# Patient Record
Sex: Male | Born: 1945 | Race: White | Hispanic: No | Marital: Married | Smoking: Former smoker
Health system: Southern US, Community
[De-identification: ages and names within clinical notes are randomized; demographics above are authoritative.]

## PROBLEM LIST (undated history)

## (undated) DIAGNOSIS — J309 Allergic rhinitis, unspecified: Secondary | ICD-10-CM

## (undated) DIAGNOSIS — R011 Cardiac murmur, unspecified: Secondary | ICD-10-CM

## (undated) DIAGNOSIS — J329 Chronic sinusitis, unspecified: Secondary | ICD-10-CM

## (undated) DIAGNOSIS — R822 Biliuria: Secondary | ICD-10-CM

## (undated) DIAGNOSIS — B079 Viral wart, unspecified: Secondary | ICD-10-CM

## (undated) DIAGNOSIS — K219 Gastro-esophageal reflux disease without esophagitis: Secondary | ICD-10-CM

## (undated) DIAGNOSIS — F419 Anxiety disorder, unspecified: Secondary | ICD-10-CM

## (undated) DIAGNOSIS — H6980 Other specified disorders of Eustachian tube, unspecified ear: Secondary | ICD-10-CM

## (undated) DIAGNOSIS — M51379 Other intervertebral disc degeneration, lumbosacral region without mention of lumbar back pain or lower extremity pain: Secondary | ICD-10-CM

## (undated) DIAGNOSIS — M1990 Unspecified osteoarthritis, unspecified site: Secondary | ICD-10-CM

## (undated) DIAGNOSIS — I10 Essential (primary) hypertension: Secondary | ICD-10-CM

## (undated) DIAGNOSIS — G4730 Sleep apnea, unspecified: Secondary | ICD-10-CM

## (undated) DIAGNOSIS — M5137 Other intervertebral disc degeneration, lumbosacral region: Secondary | ICD-10-CM

## (undated) DIAGNOSIS — M797 Fibromyalgia: Secondary | ICD-10-CM

## (undated) DIAGNOSIS — H6990 Unspecified Eustachian tube disorder, unspecified ear: Secondary | ICD-10-CM

## (undated) DIAGNOSIS — L570 Actinic keratosis: Secondary | ICD-10-CM

## (undated) DIAGNOSIS — N529 Male erectile dysfunction, unspecified: Secondary | ICD-10-CM

## (undated) DIAGNOSIS — N400 Enlarged prostate without lower urinary tract symptoms: Secondary | ICD-10-CM

## (undated) HISTORY — DX: Actinic keratosis: L57.0

## (undated) HISTORY — DX: Benign prostatic hyperplasia without lower urinary tract symptoms: N40.0

## (undated) HISTORY — DX: Unspecified eustachian tube disorder, unspecified ear: H69.90

## (undated) HISTORY — PX: JOINT REPLACEMENT: SHX530

## (undated) HISTORY — DX: Other intervertebral disc degeneration, lumbosacral region: M51.37

## (undated) HISTORY — DX: Other specified disorders of Eustachian tube, unspecified ear: H69.80

## (undated) HISTORY — PX: EYE SURGERY: SHX253

## (undated) HISTORY — DX: Other intervertebral disc degeneration, lumbosacral region without mention of lumbar back pain or lower extremity pain: M51.379

## (undated) HISTORY — PX: CATARACT EXTRACTION: SUR2

## (undated) HISTORY — DX: Unspecified osteoarthritis, unspecified site: M19.90

## (undated) HISTORY — DX: Biliuria: R82.2

## (undated) HISTORY — DX: Chronic sinusitis, unspecified: J32.9

## (undated) HISTORY — DX: Viral wart, unspecified: B07.9

## (undated) HISTORY — DX: Essential (primary) hypertension: I10

## (undated) HISTORY — DX: Allergic rhinitis, unspecified: J30.9

## (undated) HISTORY — PX: TONSILLECTOMY: SUR1361

## (undated) HISTORY — DX: Male erectile dysfunction, unspecified: N52.9

## (undated) HISTORY — PX: HIP SURGERY: SHX245

## (undated) HISTORY — PX: GALLBLADDER SURGERY: SHX652

## (undated) SURGERY — Surgical Case
Anesthesia: *Unknown

---

## 2003-10-03 HISTORY — PX: CHOLECYSTECTOMY: SHX55

## 2012-05-20 DIAGNOSIS — N400 Enlarged prostate without lower urinary tract symptoms: Secondary | ICD-10-CM | POA: Diagnosis not present

## 2012-05-20 DIAGNOSIS — L821 Other seborrheic keratosis: Secondary | ICD-10-CM | POA: Diagnosis not present

## 2012-05-20 DIAGNOSIS — I10 Essential (primary) hypertension: Secondary | ICD-10-CM | POA: Diagnosis not present

## 2012-05-28 DIAGNOSIS — L408 Other psoriasis: Secondary | ICD-10-CM | POA: Diagnosis not present

## 2012-05-28 DIAGNOSIS — D2370 Other benign neoplasm of skin of unspecified lower limb, including hip: Secondary | ICD-10-CM | POA: Diagnosis not present

## 2012-05-28 DIAGNOSIS — L738 Other specified follicular disorders: Secondary | ICD-10-CM | POA: Diagnosis not present

## 2012-05-28 DIAGNOSIS — L570 Actinic keratosis: Secondary | ICD-10-CM | POA: Diagnosis not present

## 2012-05-28 DIAGNOSIS — D485 Neoplasm of uncertain behavior of skin: Secondary | ICD-10-CM | POA: Diagnosis not present

## 2012-06-18 DIAGNOSIS — I10 Essential (primary) hypertension: Secondary | ICD-10-CM | POA: Diagnosis not present

## 2012-06-18 DIAGNOSIS — N400 Enlarged prostate without lower urinary tract symptoms: Secondary | ICD-10-CM | POA: Diagnosis not present

## 2012-06-18 DIAGNOSIS — F329 Major depressive disorder, single episode, unspecified: Secondary | ICD-10-CM | POA: Diagnosis not present

## 2012-06-18 DIAGNOSIS — M1990 Unspecified osteoarthritis, unspecified site: Secondary | ICD-10-CM | POA: Diagnosis not present

## 2012-07-16 DIAGNOSIS — Z23 Encounter for immunization: Secondary | ICD-10-CM | POA: Diagnosis not present

## 2012-07-16 DIAGNOSIS — I10 Essential (primary) hypertension: Secondary | ICD-10-CM | POA: Diagnosis not present

## 2012-07-22 DIAGNOSIS — R350 Frequency of micturition: Secondary | ICD-10-CM | POA: Diagnosis not present

## 2012-07-22 DIAGNOSIS — R3911 Hesitancy of micturition: Secondary | ICD-10-CM | POA: Diagnosis not present

## 2012-07-22 DIAGNOSIS — R3915 Urgency of urination: Secondary | ICD-10-CM | POA: Insufficient documentation

## 2012-07-22 DIAGNOSIS — R351 Nocturia: Secondary | ICD-10-CM | POA: Diagnosis not present

## 2012-07-24 DIAGNOSIS — B079 Viral wart, unspecified: Secondary | ICD-10-CM | POA: Diagnosis not present

## 2012-07-24 DIAGNOSIS — D239 Other benign neoplasm of skin, unspecified: Secondary | ICD-10-CM | POA: Diagnosis not present

## 2012-07-24 DIAGNOSIS — L821 Other seborrheic keratosis: Secondary | ICD-10-CM | POA: Diagnosis not present

## 2012-08-12 DIAGNOSIS — N401 Enlarged prostate with lower urinary tract symptoms: Secondary | ICD-10-CM | POA: Diagnosis not present

## 2012-08-12 DIAGNOSIS — R351 Nocturia: Secondary | ICD-10-CM | POA: Diagnosis not present

## 2012-08-12 DIAGNOSIS — R3911 Hesitancy of micturition: Secondary | ICD-10-CM | POA: Diagnosis not present

## 2012-08-19 DIAGNOSIS — L408 Other psoriasis: Secondary | ICD-10-CM | POA: Diagnosis not present

## 2012-08-19 DIAGNOSIS — L570 Actinic keratosis: Secondary | ICD-10-CM | POA: Diagnosis not present

## 2012-08-19 DIAGNOSIS — L738 Other specified follicular disorders: Secondary | ICD-10-CM | POA: Diagnosis not present

## 2012-08-28 DIAGNOSIS — Z79899 Other long term (current) drug therapy: Secondary | ICD-10-CM | POA: Diagnosis not present

## 2012-09-05 DIAGNOSIS — L923 Foreign body granuloma of the skin and subcutaneous tissue: Secondary | ICD-10-CM | POA: Diagnosis not present

## 2012-09-05 DIAGNOSIS — L408 Other psoriasis: Secondary | ICD-10-CM | POA: Diagnosis not present

## 2012-09-05 DIAGNOSIS — L538 Other specified erythematous conditions: Secondary | ICD-10-CM | POA: Diagnosis not present

## 2012-09-16 DIAGNOSIS — N401 Enlarged prostate with lower urinary tract symptoms: Secondary | ICD-10-CM | POA: Diagnosis not present

## 2012-09-16 DIAGNOSIS — R3915 Urgency of urination: Secondary | ICD-10-CM | POA: Diagnosis not present

## 2012-09-16 DIAGNOSIS — R3911 Hesitancy of micturition: Secondary | ICD-10-CM | POA: Diagnosis not present

## 2012-09-24 DIAGNOSIS — M1610 Unilateral primary osteoarthritis, unspecified hip: Secondary | ICD-10-CM | POA: Diagnosis not present

## 2012-09-24 DIAGNOSIS — M5137 Other intervertebral disc degeneration, lumbosacral region: Secondary | ICD-10-CM | POA: Diagnosis not present

## 2012-10-02 HISTORY — PX: COLONOSCOPY: SHX174

## 2012-10-08 ENCOUNTER — Ambulatory Visit: Payer: Self-pay | Admitting: Urology

## 2012-10-08 DIAGNOSIS — Z01812 Encounter for preprocedural laboratory examination: Secondary | ICD-10-CM | POA: Diagnosis not present

## 2012-10-08 DIAGNOSIS — Z01810 Encounter for preprocedural cardiovascular examination: Secondary | ICD-10-CM | POA: Diagnosis not present

## 2012-10-08 DIAGNOSIS — N400 Enlarged prostate without lower urinary tract symptoms: Secondary | ICD-10-CM | POA: Diagnosis not present

## 2012-10-08 DIAGNOSIS — I119 Hypertensive heart disease without heart failure: Secondary | ICD-10-CM | POA: Diagnosis not present

## 2012-10-10 LAB — URINE CULTURE

## 2012-10-11 DIAGNOSIS — R011 Cardiac murmur, unspecified: Secondary | ICD-10-CM | POA: Diagnosis not present

## 2012-10-11 DIAGNOSIS — R9431 Abnormal electrocardiogram [ECG] [EKG]: Secondary | ICD-10-CM | POA: Diagnosis not present

## 2012-10-16 ENCOUNTER — Ambulatory Visit: Payer: Self-pay | Admitting: Urology

## 2012-10-16 DIAGNOSIS — R350 Frequency of micturition: Secondary | ICD-10-CM | POA: Diagnosis not present

## 2012-10-16 DIAGNOSIS — E669 Obesity, unspecified: Secondary | ICD-10-CM | POA: Diagnosis not present

## 2012-10-16 DIAGNOSIS — F3289 Other specified depressive episodes: Secondary | ICD-10-CM | POA: Diagnosis not present

## 2012-10-16 DIAGNOSIS — N401 Enlarged prostate with lower urinary tract symptoms: Secondary | ICD-10-CM | POA: Diagnosis not present

## 2012-10-16 DIAGNOSIS — Z96649 Presence of unspecified artificial hip joint: Secondary | ICD-10-CM | POA: Diagnosis not present

## 2012-10-16 DIAGNOSIS — IMO0002 Reserved for concepts with insufficient information to code with codable children: Secondary | ICD-10-CM | POA: Diagnosis not present

## 2012-10-16 DIAGNOSIS — Z79899 Other long term (current) drug therapy: Secondary | ICD-10-CM | POA: Diagnosis not present

## 2012-10-16 DIAGNOSIS — I10 Essential (primary) hypertension: Secondary | ICD-10-CM | POA: Diagnosis not present

## 2012-10-21 LAB — PATHOLOGY REPORT

## 2012-10-24 ENCOUNTER — Ambulatory Visit: Payer: Self-pay | Admitting: Cardiovascular Disease

## 2012-11-06 DIAGNOSIS — N401 Enlarged prostate with lower urinary tract symptoms: Secondary | ICD-10-CM | POA: Diagnosis not present

## 2012-11-12 DIAGNOSIS — L259 Unspecified contact dermatitis, unspecified cause: Secondary | ICD-10-CM | POA: Diagnosis not present

## 2012-11-21 DIAGNOSIS — I10 Essential (primary) hypertension: Secondary | ICD-10-CM | POA: Diagnosis not present

## 2012-12-31 DIAGNOSIS — M169 Osteoarthritis of hip, unspecified: Secondary | ICD-10-CM | POA: Diagnosis not present

## 2013-01-06 ENCOUNTER — Ambulatory Visit: Payer: Self-pay | Admitting: General Practice

## 2013-01-06 LAB — URINALYSIS, COMPLETE
Bacteria: NONE SEEN
Glucose,UR: NEGATIVE mg/dL (ref 0–75)
Ph: 6 (ref 4.5–8.0)
RBC,UR: 15 /HPF (ref 0–5)
Specific Gravity: 1.017 (ref 1.003–1.030)
WBC UR: 19 /HPF (ref 0–5)

## 2013-01-06 LAB — BASIC METABOLIC PANEL
Anion Gap: 4 — ABNORMAL LOW (ref 7–16)
Calcium, Total: 8.6 mg/dL (ref 8.5–10.1)
Chloride: 105 mmol/L (ref 98–107)
Creatinine: 1.07 mg/dL (ref 0.60–1.30)
EGFR (African American): >60
Glucose: 96 mg/dL (ref 65–99)
Osmolality: 278 (ref 275–301)
Sodium: 139 mmol/L (ref 136–145)

## 2013-01-06 LAB — CBC
HGB: 14.2 g/dL (ref 13.0–18.0)
MCH: 29.6 pg (ref 26.0–34.0)
MCHC: 33.5 g/dL (ref 32.0–36.0)
MCV: 88 fL (ref 80–100)
Platelet: 257 10*3/uL (ref 150–440)
RBC: 4.78 10*6/uL (ref 4.40–5.90)
WBC: 8.4 10*3/uL (ref 3.8–10.6)

## 2013-01-06 LAB — PROTIME-INR: Prothrombin Time: 13 secs (ref 11.5–14.7)

## 2013-01-06 LAB — APTT: Activated PTT: 28.1 secs (ref 23.6–35.9)

## 2013-01-06 LAB — SEDIMENTATION RATE: Erythrocyte Sed Rate: 7 mm/hr (ref 0–20)

## 2013-01-08 ENCOUNTER — Inpatient Hospital Stay: Payer: Self-pay | Admitting: General Practice

## 2013-01-08 DIAGNOSIS — Z8042 Family history of malignant neoplasm of prostate: Secondary | ICD-10-CM | POA: Diagnosis not present

## 2013-01-08 DIAGNOSIS — Z471 Aftercare following joint replacement surgery: Secondary | ICD-10-CM | POA: Diagnosis not present

## 2013-01-08 DIAGNOSIS — Z8249 Family history of ischemic heart disease and other diseases of the circulatory system: Secondary | ICD-10-CM | POA: Diagnosis not present

## 2013-01-08 DIAGNOSIS — M169 Osteoarthritis of hip, unspecified: Secondary | ICD-10-CM | POA: Diagnosis present

## 2013-01-08 DIAGNOSIS — Z823 Family history of stroke: Secondary | ICD-10-CM | POA: Diagnosis not present

## 2013-01-08 DIAGNOSIS — L408 Other psoriasis: Secondary | ICD-10-CM | POA: Diagnosis present

## 2013-01-08 DIAGNOSIS — H269 Unspecified cataract: Secondary | ICD-10-CM | POA: Diagnosis present

## 2013-01-08 DIAGNOSIS — M2170 Unequal limb length (acquired), unspecified site: Secondary | ICD-10-CM | POA: Diagnosis present

## 2013-01-08 DIAGNOSIS — Z91040 Latex allergy status: Secondary | ICD-10-CM | POA: Diagnosis not present

## 2013-01-08 DIAGNOSIS — I10 Essential (primary) hypertension: Secondary | ICD-10-CM | POA: Diagnosis present

## 2013-01-08 DIAGNOSIS — N400 Enlarged prostate without lower urinary tract symptoms: Secondary | ICD-10-CM | POA: Diagnosis present

## 2013-01-08 DIAGNOSIS — Z79899 Other long term (current) drug therapy: Secondary | ICD-10-CM | POA: Diagnosis not present

## 2013-01-08 DIAGNOSIS — Z96649 Presence of unspecified artificial hip joint: Secondary | ICD-10-CM | POA: Diagnosis not present

## 2013-01-08 DIAGNOSIS — Z803 Family history of malignant neoplasm of breast: Secondary | ICD-10-CM | POA: Diagnosis not present

## 2013-01-09 LAB — HEMOGLOBIN: HGB: 11.6 g/dL — ABNORMAL LOW (ref 13.0–18.0)

## 2013-01-09 LAB — BASIC METABOLIC PANEL
Anion Gap: 4 — ABNORMAL LOW (ref 7–16)
BUN: 13 mg/dL (ref 7–18)
Calcium, Total: 8.3 mg/dL — ABNORMAL LOW (ref 8.5–10.1)
Chloride: 106 mmol/L (ref 98–107)
Co2: 28 mmol/L (ref 21–32)
Creatinine: 1.05 mg/dL (ref 0.60–1.30)
EGFR (African American): >60
EGFR (Non-African Amer.): >60
Osmolality: 277 (ref 275–301)
Potassium: 4 mmol/L (ref 3.5–5.1)
Sodium: 138 mmol/L (ref 136–145)

## 2013-01-09 LAB — PLATELET COUNT: Platelet: 226 10*3/uL (ref 150–440)

## 2013-01-10 LAB — PATHOLOGY REPORT

## 2013-01-10 LAB — BASIC METABOLIC PANEL
BUN: 13 mg/dL (ref 7–18)
Calcium, Total: 8 mg/dL — ABNORMAL LOW (ref 8.5–10.1)
Chloride: 106 mmol/L (ref 98–107)
Creatinine: 0.93 mg/dL (ref 0.60–1.30)
EGFR (Non-African Amer.): >60
Osmolality: 278 (ref 275–301)

## 2013-01-10 LAB — HEMOGLOBIN: HGB: 11.4 g/dL — ABNORMAL LOW (ref 13.0–18.0)

## 2013-01-10 LAB — PLATELET COUNT: Platelet: 218 10*3/uL (ref 150–440)

## 2013-01-12 DIAGNOSIS — Z471 Aftercare following joint replacement surgery: Secondary | ICD-10-CM | POA: Diagnosis not present

## 2013-01-12 DIAGNOSIS — I10 Essential (primary) hypertension: Secondary | ICD-10-CM | POA: Diagnosis not present

## 2013-01-12 DIAGNOSIS — Z96649 Presence of unspecified artificial hip joint: Secondary | ICD-10-CM | POA: Diagnosis not present

## 2013-01-12 DIAGNOSIS — Z7901 Long term (current) use of anticoagulants: Secondary | ICD-10-CM | POA: Diagnosis not present

## 2013-01-12 DIAGNOSIS — IMO0001 Reserved for inherently not codable concepts without codable children: Secondary | ICD-10-CM | POA: Diagnosis not present

## 2013-01-13 DIAGNOSIS — IMO0001 Reserved for inherently not codable concepts without codable children: Secondary | ICD-10-CM | POA: Diagnosis not present

## 2013-01-13 DIAGNOSIS — Z96649 Presence of unspecified artificial hip joint: Secondary | ICD-10-CM | POA: Diagnosis not present

## 2013-01-13 DIAGNOSIS — I10 Essential (primary) hypertension: Secondary | ICD-10-CM | POA: Diagnosis not present

## 2013-01-13 DIAGNOSIS — Z7901 Long term (current) use of anticoagulants: Secondary | ICD-10-CM | POA: Diagnosis not present

## 2013-01-13 DIAGNOSIS — Z471 Aftercare following joint replacement surgery: Secondary | ICD-10-CM | POA: Diagnosis not present

## 2013-01-15 DIAGNOSIS — Z7901 Long term (current) use of anticoagulants: Secondary | ICD-10-CM | POA: Diagnosis not present

## 2013-01-15 DIAGNOSIS — IMO0001 Reserved for inherently not codable concepts without codable children: Secondary | ICD-10-CM | POA: Diagnosis not present

## 2013-01-15 DIAGNOSIS — I10 Essential (primary) hypertension: Secondary | ICD-10-CM | POA: Diagnosis not present

## 2013-01-15 DIAGNOSIS — Z471 Aftercare following joint replacement surgery: Secondary | ICD-10-CM | POA: Diagnosis not present

## 2013-01-15 DIAGNOSIS — Z96649 Presence of unspecified artificial hip joint: Secondary | ICD-10-CM | POA: Diagnosis not present

## 2013-01-17 DIAGNOSIS — Z471 Aftercare following joint replacement surgery: Secondary | ICD-10-CM | POA: Diagnosis not present

## 2013-01-17 DIAGNOSIS — IMO0001 Reserved for inherently not codable concepts without codable children: Secondary | ICD-10-CM | POA: Diagnosis not present

## 2013-01-17 DIAGNOSIS — Z96649 Presence of unspecified artificial hip joint: Secondary | ICD-10-CM | POA: Diagnosis not present

## 2013-01-17 DIAGNOSIS — I10 Essential (primary) hypertension: Secondary | ICD-10-CM | POA: Diagnosis not present

## 2013-01-17 DIAGNOSIS — Z7901 Long term (current) use of anticoagulants: Secondary | ICD-10-CM | POA: Diagnosis not present

## 2013-01-20 DIAGNOSIS — I10 Essential (primary) hypertension: Secondary | ICD-10-CM | POA: Diagnosis not present

## 2013-01-20 DIAGNOSIS — Z471 Aftercare following joint replacement surgery: Secondary | ICD-10-CM | POA: Diagnosis not present

## 2013-01-20 DIAGNOSIS — Z7901 Long term (current) use of anticoagulants: Secondary | ICD-10-CM | POA: Diagnosis not present

## 2013-01-20 DIAGNOSIS — Z96649 Presence of unspecified artificial hip joint: Secondary | ICD-10-CM | POA: Diagnosis not present

## 2013-01-20 DIAGNOSIS — IMO0001 Reserved for inherently not codable concepts without codable children: Secondary | ICD-10-CM | POA: Diagnosis not present

## 2013-01-22 DIAGNOSIS — Z96649 Presence of unspecified artificial hip joint: Secondary | ICD-10-CM | POA: Diagnosis not present

## 2013-01-22 DIAGNOSIS — Z7901 Long term (current) use of anticoagulants: Secondary | ICD-10-CM | POA: Diagnosis not present

## 2013-01-22 DIAGNOSIS — Z471 Aftercare following joint replacement surgery: Secondary | ICD-10-CM | POA: Diagnosis not present

## 2013-01-22 DIAGNOSIS — I10 Essential (primary) hypertension: Secondary | ICD-10-CM | POA: Diagnosis not present

## 2013-01-22 DIAGNOSIS — IMO0001 Reserved for inherently not codable concepts without codable children: Secondary | ICD-10-CM | POA: Diagnosis not present

## 2013-01-24 DIAGNOSIS — IMO0001 Reserved for inherently not codable concepts without codable children: Secondary | ICD-10-CM | POA: Diagnosis not present

## 2013-01-24 DIAGNOSIS — Z96649 Presence of unspecified artificial hip joint: Secondary | ICD-10-CM | POA: Diagnosis not present

## 2013-01-24 DIAGNOSIS — Z7901 Long term (current) use of anticoagulants: Secondary | ICD-10-CM | POA: Diagnosis not present

## 2013-01-24 DIAGNOSIS — I10 Essential (primary) hypertension: Secondary | ICD-10-CM | POA: Diagnosis not present

## 2013-01-24 DIAGNOSIS — Z471 Aftercare following joint replacement surgery: Secondary | ICD-10-CM | POA: Diagnosis not present

## 2013-01-27 DIAGNOSIS — Z7901 Long term (current) use of anticoagulants: Secondary | ICD-10-CM | POA: Diagnosis not present

## 2013-01-27 DIAGNOSIS — Z471 Aftercare following joint replacement surgery: Secondary | ICD-10-CM | POA: Diagnosis not present

## 2013-01-27 DIAGNOSIS — IMO0001 Reserved for inherently not codable concepts without codable children: Secondary | ICD-10-CM | POA: Diagnosis not present

## 2013-01-27 DIAGNOSIS — Z96649 Presence of unspecified artificial hip joint: Secondary | ICD-10-CM | POA: Diagnosis not present

## 2013-01-27 DIAGNOSIS — I10 Essential (primary) hypertension: Secondary | ICD-10-CM | POA: Diagnosis not present

## 2013-01-30 DIAGNOSIS — IMO0001 Reserved for inherently not codable concepts without codable children: Secondary | ICD-10-CM | POA: Diagnosis not present

## 2013-01-30 DIAGNOSIS — Z96649 Presence of unspecified artificial hip joint: Secondary | ICD-10-CM | POA: Diagnosis not present

## 2013-01-30 DIAGNOSIS — I10 Essential (primary) hypertension: Secondary | ICD-10-CM | POA: Diagnosis not present

## 2013-01-30 DIAGNOSIS — Z471 Aftercare following joint replacement surgery: Secondary | ICD-10-CM | POA: Diagnosis not present

## 2013-01-30 DIAGNOSIS — Z7901 Long term (current) use of anticoagulants: Secondary | ICD-10-CM | POA: Diagnosis not present

## 2013-01-31 DIAGNOSIS — Z96649 Presence of unspecified artificial hip joint: Secondary | ICD-10-CM | POA: Diagnosis not present

## 2013-01-31 DIAGNOSIS — Z7901 Long term (current) use of anticoagulants: Secondary | ICD-10-CM | POA: Diagnosis not present

## 2013-01-31 DIAGNOSIS — Z471 Aftercare following joint replacement surgery: Secondary | ICD-10-CM | POA: Diagnosis not present

## 2013-01-31 DIAGNOSIS — I10 Essential (primary) hypertension: Secondary | ICD-10-CM | POA: Diagnosis not present

## 2013-01-31 DIAGNOSIS — IMO0001 Reserved for inherently not codable concepts without codable children: Secondary | ICD-10-CM | POA: Diagnosis not present

## 2013-02-02 ENCOUNTER — Observation Stay: Payer: Self-pay | Admitting: General Practice

## 2013-02-02 ENCOUNTER — Ambulatory Visit: Payer: Self-pay | Admitting: Orthopedic Surgery

## 2013-02-02 DIAGNOSIS — E119 Type 2 diabetes mellitus without complications: Secondary | ICD-10-CM | POA: Diagnosis not present

## 2013-02-02 DIAGNOSIS — S79919A Unspecified injury of unspecified hip, initial encounter: Secondary | ICD-10-CM | POA: Diagnosis not present

## 2013-02-02 DIAGNOSIS — I10 Essential (primary) hypertension: Secondary | ICD-10-CM | POA: Diagnosis not present

## 2013-02-02 DIAGNOSIS — Z96649 Presence of unspecified artificial hip joint: Secondary | ICD-10-CM | POA: Diagnosis not present

## 2013-02-02 DIAGNOSIS — Z471 Aftercare following joint replacement surgery: Secondary | ICD-10-CM | POA: Diagnosis not present

## 2013-02-02 DIAGNOSIS — M1990 Unspecified osteoarthritis, unspecified site: Secondary | ICD-10-CM | POA: Diagnosis not present

## 2013-02-02 DIAGNOSIS — T84029A Dislocation of unspecified internal joint prosthesis, initial encounter: Secondary | ICD-10-CM | POA: Diagnosis not present

## 2013-02-02 DIAGNOSIS — S73006A Unspecified dislocation of unspecified hip, initial encounter: Secondary | ICD-10-CM | POA: Diagnosis not present

## 2013-02-04 DIAGNOSIS — Z7901 Long term (current) use of anticoagulants: Secondary | ICD-10-CM | POA: Diagnosis not present

## 2013-02-04 DIAGNOSIS — IMO0001 Reserved for inherently not codable concepts without codable children: Secondary | ICD-10-CM | POA: Diagnosis not present

## 2013-02-04 DIAGNOSIS — Z96649 Presence of unspecified artificial hip joint: Secondary | ICD-10-CM | POA: Diagnosis not present

## 2013-02-04 DIAGNOSIS — I10 Essential (primary) hypertension: Secondary | ICD-10-CM | POA: Diagnosis not present

## 2013-02-04 DIAGNOSIS — Z471 Aftercare following joint replacement surgery: Secondary | ICD-10-CM | POA: Diagnosis not present

## 2013-02-05 DIAGNOSIS — Z96649 Presence of unspecified artificial hip joint: Secondary | ICD-10-CM | POA: Diagnosis not present

## 2013-02-05 DIAGNOSIS — Z471 Aftercare following joint replacement surgery: Secondary | ICD-10-CM | POA: Diagnosis not present

## 2013-02-05 DIAGNOSIS — IMO0001 Reserved for inherently not codable concepts without codable children: Secondary | ICD-10-CM | POA: Diagnosis not present

## 2013-02-05 DIAGNOSIS — I10 Essential (primary) hypertension: Secondary | ICD-10-CM | POA: Diagnosis not present

## 2013-02-05 DIAGNOSIS — Z7901 Long term (current) use of anticoagulants: Secondary | ICD-10-CM | POA: Diagnosis not present

## 2013-02-07 DIAGNOSIS — I10 Essential (primary) hypertension: Secondary | ICD-10-CM | POA: Diagnosis not present

## 2013-02-07 DIAGNOSIS — Z96649 Presence of unspecified artificial hip joint: Secondary | ICD-10-CM | POA: Diagnosis not present

## 2013-02-07 DIAGNOSIS — IMO0001 Reserved for inherently not codable concepts without codable children: Secondary | ICD-10-CM | POA: Diagnosis not present

## 2013-02-07 DIAGNOSIS — Z471 Aftercare following joint replacement surgery: Secondary | ICD-10-CM | POA: Diagnosis not present

## 2013-02-07 DIAGNOSIS — Z7901 Long term (current) use of anticoagulants: Secondary | ICD-10-CM | POA: Diagnosis not present

## 2013-02-10 DIAGNOSIS — IMO0001 Reserved for inherently not codable concepts without codable children: Secondary | ICD-10-CM | POA: Diagnosis not present

## 2013-02-10 DIAGNOSIS — I10 Essential (primary) hypertension: Secondary | ICD-10-CM | POA: Diagnosis not present

## 2013-02-10 DIAGNOSIS — Z96649 Presence of unspecified artificial hip joint: Secondary | ICD-10-CM | POA: Diagnosis not present

## 2013-02-10 DIAGNOSIS — Z471 Aftercare following joint replacement surgery: Secondary | ICD-10-CM | POA: Diagnosis not present

## 2013-02-10 DIAGNOSIS — Z7901 Long term (current) use of anticoagulants: Secondary | ICD-10-CM | POA: Diagnosis not present

## 2013-02-12 DIAGNOSIS — Z471 Aftercare following joint replacement surgery: Secondary | ICD-10-CM | POA: Diagnosis not present

## 2013-02-12 DIAGNOSIS — Z96649 Presence of unspecified artificial hip joint: Secondary | ICD-10-CM | POA: Diagnosis not present

## 2013-02-12 DIAGNOSIS — Z7901 Long term (current) use of anticoagulants: Secondary | ICD-10-CM | POA: Diagnosis not present

## 2013-02-12 DIAGNOSIS — IMO0001 Reserved for inherently not codable concepts without codable children: Secondary | ICD-10-CM | POA: Diagnosis not present

## 2013-02-12 DIAGNOSIS — I10 Essential (primary) hypertension: Secondary | ICD-10-CM | POA: Diagnosis not present

## 2013-02-14 DIAGNOSIS — I10 Essential (primary) hypertension: Secondary | ICD-10-CM | POA: Diagnosis not present

## 2013-02-14 DIAGNOSIS — Z7901 Long term (current) use of anticoagulants: Secondary | ICD-10-CM | POA: Diagnosis not present

## 2013-02-14 DIAGNOSIS — Z471 Aftercare following joint replacement surgery: Secondary | ICD-10-CM | POA: Diagnosis not present

## 2013-02-14 DIAGNOSIS — Z96649 Presence of unspecified artificial hip joint: Secondary | ICD-10-CM | POA: Diagnosis not present

## 2013-02-14 DIAGNOSIS — IMO0001 Reserved for inherently not codable concepts without codable children: Secondary | ICD-10-CM | POA: Diagnosis not present

## 2013-02-17 DIAGNOSIS — Z96649 Presence of unspecified artificial hip joint: Secondary | ICD-10-CM | POA: Diagnosis not present

## 2013-02-17 DIAGNOSIS — Z7901 Long term (current) use of anticoagulants: Secondary | ICD-10-CM | POA: Diagnosis not present

## 2013-02-17 DIAGNOSIS — I10 Essential (primary) hypertension: Secondary | ICD-10-CM | POA: Diagnosis not present

## 2013-02-17 DIAGNOSIS — Z471 Aftercare following joint replacement surgery: Secondary | ICD-10-CM | POA: Diagnosis not present

## 2013-02-17 DIAGNOSIS — IMO0001 Reserved for inherently not codable concepts without codable children: Secondary | ICD-10-CM | POA: Diagnosis not present

## 2013-02-18 DIAGNOSIS — Z96649 Presence of unspecified artificial hip joint: Secondary | ICD-10-CM | POA: Diagnosis not present

## 2013-02-19 DIAGNOSIS — Z96649 Presence of unspecified artificial hip joint: Secondary | ICD-10-CM | POA: Diagnosis not present

## 2013-02-19 DIAGNOSIS — Z7901 Long term (current) use of anticoagulants: Secondary | ICD-10-CM | POA: Diagnosis not present

## 2013-02-19 DIAGNOSIS — Z471 Aftercare following joint replacement surgery: Secondary | ICD-10-CM | POA: Diagnosis not present

## 2013-02-19 DIAGNOSIS — I10 Essential (primary) hypertension: Secondary | ICD-10-CM | POA: Diagnosis not present

## 2013-02-19 DIAGNOSIS — IMO0001 Reserved for inherently not codable concepts without codable children: Secondary | ICD-10-CM | POA: Diagnosis not present

## 2013-02-24 DIAGNOSIS — Z7901 Long term (current) use of anticoagulants: Secondary | ICD-10-CM | POA: Diagnosis not present

## 2013-02-24 DIAGNOSIS — IMO0001 Reserved for inherently not codable concepts without codable children: Secondary | ICD-10-CM | POA: Diagnosis not present

## 2013-02-24 DIAGNOSIS — Z471 Aftercare following joint replacement surgery: Secondary | ICD-10-CM | POA: Diagnosis not present

## 2013-02-24 DIAGNOSIS — Z96649 Presence of unspecified artificial hip joint: Secondary | ICD-10-CM | POA: Diagnosis not present

## 2013-02-24 DIAGNOSIS — I10 Essential (primary) hypertension: Secondary | ICD-10-CM | POA: Diagnosis not present

## 2013-02-26 DIAGNOSIS — Z96649 Presence of unspecified artificial hip joint: Secondary | ICD-10-CM | POA: Diagnosis not present

## 2013-02-26 DIAGNOSIS — Z471 Aftercare following joint replacement surgery: Secondary | ICD-10-CM | POA: Diagnosis not present

## 2013-02-26 DIAGNOSIS — I10 Essential (primary) hypertension: Secondary | ICD-10-CM | POA: Diagnosis not present

## 2013-02-26 DIAGNOSIS — Z7901 Long term (current) use of anticoagulants: Secondary | ICD-10-CM | POA: Diagnosis not present

## 2013-02-26 DIAGNOSIS — IMO0001 Reserved for inherently not codable concepts without codable children: Secondary | ICD-10-CM | POA: Diagnosis not present

## 2013-02-28 DIAGNOSIS — I10 Essential (primary) hypertension: Secondary | ICD-10-CM | POA: Diagnosis not present

## 2013-02-28 DIAGNOSIS — Z471 Aftercare following joint replacement surgery: Secondary | ICD-10-CM | POA: Diagnosis not present

## 2013-02-28 DIAGNOSIS — IMO0001 Reserved for inherently not codable concepts without codable children: Secondary | ICD-10-CM | POA: Diagnosis not present

## 2013-02-28 DIAGNOSIS — Z96649 Presence of unspecified artificial hip joint: Secondary | ICD-10-CM | POA: Diagnosis not present

## 2013-02-28 DIAGNOSIS — Z7901 Long term (current) use of anticoagulants: Secondary | ICD-10-CM | POA: Diagnosis not present

## 2013-03-03 DIAGNOSIS — L738 Other specified follicular disorders: Secondary | ICD-10-CM | POA: Diagnosis not present

## 2013-03-03 DIAGNOSIS — Z1283 Encounter for screening for malignant neoplasm of skin: Secondary | ICD-10-CM | POA: Diagnosis not present

## 2013-03-03 DIAGNOSIS — L570 Actinic keratosis: Secondary | ICD-10-CM | POA: Diagnosis not present

## 2013-03-03 DIAGNOSIS — L723 Sebaceous cyst: Secondary | ICD-10-CM | POA: Diagnosis not present

## 2013-03-10 DIAGNOSIS — Z96649 Presence of unspecified artificial hip joint: Secondary | ICD-10-CM | POA: Diagnosis not present

## 2013-03-10 DIAGNOSIS — Z7901 Long term (current) use of anticoagulants: Secondary | ICD-10-CM | POA: Diagnosis not present

## 2013-03-10 DIAGNOSIS — Z471 Aftercare following joint replacement surgery: Secondary | ICD-10-CM | POA: Diagnosis not present

## 2013-03-10 DIAGNOSIS — IMO0001 Reserved for inherently not codable concepts without codable children: Secondary | ICD-10-CM | POA: Diagnosis not present

## 2013-04-22 DIAGNOSIS — I776 Arteritis, unspecified: Secondary | ICD-10-CM | POA: Diagnosis not present

## 2013-04-22 DIAGNOSIS — M1710 Unilateral primary osteoarthritis, unspecified knee: Secondary | ICD-10-CM | POA: Diagnosis not present

## 2013-04-29 DIAGNOSIS — M1710 Unilateral primary osteoarthritis, unspecified knee: Secondary | ICD-10-CM | POA: Diagnosis not present

## 2013-05-06 DIAGNOSIS — I10 Essential (primary) hypertension: Secondary | ICD-10-CM | POA: Diagnosis not present

## 2013-05-06 DIAGNOSIS — M1710 Unilateral primary osteoarthritis, unspecified knee: Secondary | ICD-10-CM | POA: Diagnosis not present

## 2013-07-01 DIAGNOSIS — Z23 Encounter for immunization: Secondary | ICD-10-CM | POA: Diagnosis not present

## 2013-07-01 DIAGNOSIS — J329 Chronic sinusitis, unspecified: Secondary | ICD-10-CM | POA: Diagnosis not present

## 2013-07-01 DIAGNOSIS — N529 Male erectile dysfunction, unspecified: Secondary | ICD-10-CM | POA: Diagnosis not present

## 2013-07-22 DIAGNOSIS — M19019 Primary osteoarthritis, unspecified shoulder: Secondary | ICD-10-CM | POA: Diagnosis not present

## 2013-07-23 DIAGNOSIS — M25519 Pain in unspecified shoulder: Secondary | ICD-10-CM | POA: Diagnosis not present

## 2013-07-23 DIAGNOSIS — M19019 Primary osteoarthritis, unspecified shoulder: Secondary | ICD-10-CM | POA: Diagnosis not present

## 2013-08-11 ENCOUNTER — Ambulatory Visit: Payer: Self-pay | Admitting: Gastroenterology

## 2013-08-11 DIAGNOSIS — Z7982 Long term (current) use of aspirin: Secondary | ICD-10-CM | POA: Diagnosis not present

## 2013-08-11 DIAGNOSIS — I10 Essential (primary) hypertension: Secondary | ICD-10-CM | POA: Diagnosis not present

## 2013-08-11 DIAGNOSIS — Z91040 Latex allergy status: Secondary | ICD-10-CM | POA: Diagnosis not present

## 2013-08-11 DIAGNOSIS — Z87891 Personal history of nicotine dependence: Secondary | ICD-10-CM | POA: Diagnosis not present

## 2013-08-11 DIAGNOSIS — D126 Benign neoplasm of colon, unspecified: Secondary | ICD-10-CM | POA: Diagnosis not present

## 2013-08-11 DIAGNOSIS — Z1211 Encounter for screening for malignant neoplasm of colon: Secondary | ICD-10-CM | POA: Diagnosis not present

## 2013-08-11 DIAGNOSIS — M129 Arthropathy, unspecified: Secondary | ICD-10-CM | POA: Diagnosis not present

## 2013-08-11 DIAGNOSIS — K5730 Diverticulosis of large intestine without perforation or abscess without bleeding: Secondary | ICD-10-CM | POA: Diagnosis not present

## 2013-08-11 DIAGNOSIS — Z96649 Presence of unspecified artificial hip joint: Secondary | ICD-10-CM | POA: Diagnosis not present

## 2013-08-11 DIAGNOSIS — Z79899 Other long term (current) drug therapy: Secondary | ICD-10-CM | POA: Diagnosis not present

## 2013-08-11 DIAGNOSIS — M6281 Muscle weakness (generalized): Secondary | ICD-10-CM | POA: Diagnosis not present

## 2013-08-11 LAB — HM COLONOSCOPY

## 2013-09-11 DIAGNOSIS — J309 Allergic rhinitis, unspecified: Secondary | ICD-10-CM | POA: Diagnosis not present

## 2013-09-11 DIAGNOSIS — I8310 Varicose veins of unspecified lower extremity with inflammation: Secondary | ICD-10-CM | POA: Diagnosis not present

## 2013-09-11 DIAGNOSIS — L129 Pemphigoid, unspecified: Secondary | ICD-10-CM | POA: Diagnosis not present

## 2013-09-11 DIAGNOSIS — L408 Other psoriasis: Secondary | ICD-10-CM | POA: Diagnosis not present

## 2013-09-11 DIAGNOSIS — H6980 Other specified disorders of Eustachian tube, unspecified ear: Secondary | ICD-10-CM | POA: Diagnosis not present

## 2013-10-21 IMAGING — CR RIGHT HIP - COMPLETE 2+ VIEW
1 series · 2 of 2 positions shown · non-contrast
Comparison: none

REASON FOR EXAM: right hip periprosthetic dislocation
COMMENTS:

PROCEDURE:     DXR - DXR HIP RIGHT COMPLETE  - February 02, 2013  [DATE]
RESULT:     Comparison: 01/08/2013

[Series 1: ap · 0.17mm/px · 2 of 2 slices shown]
[im 1/2]
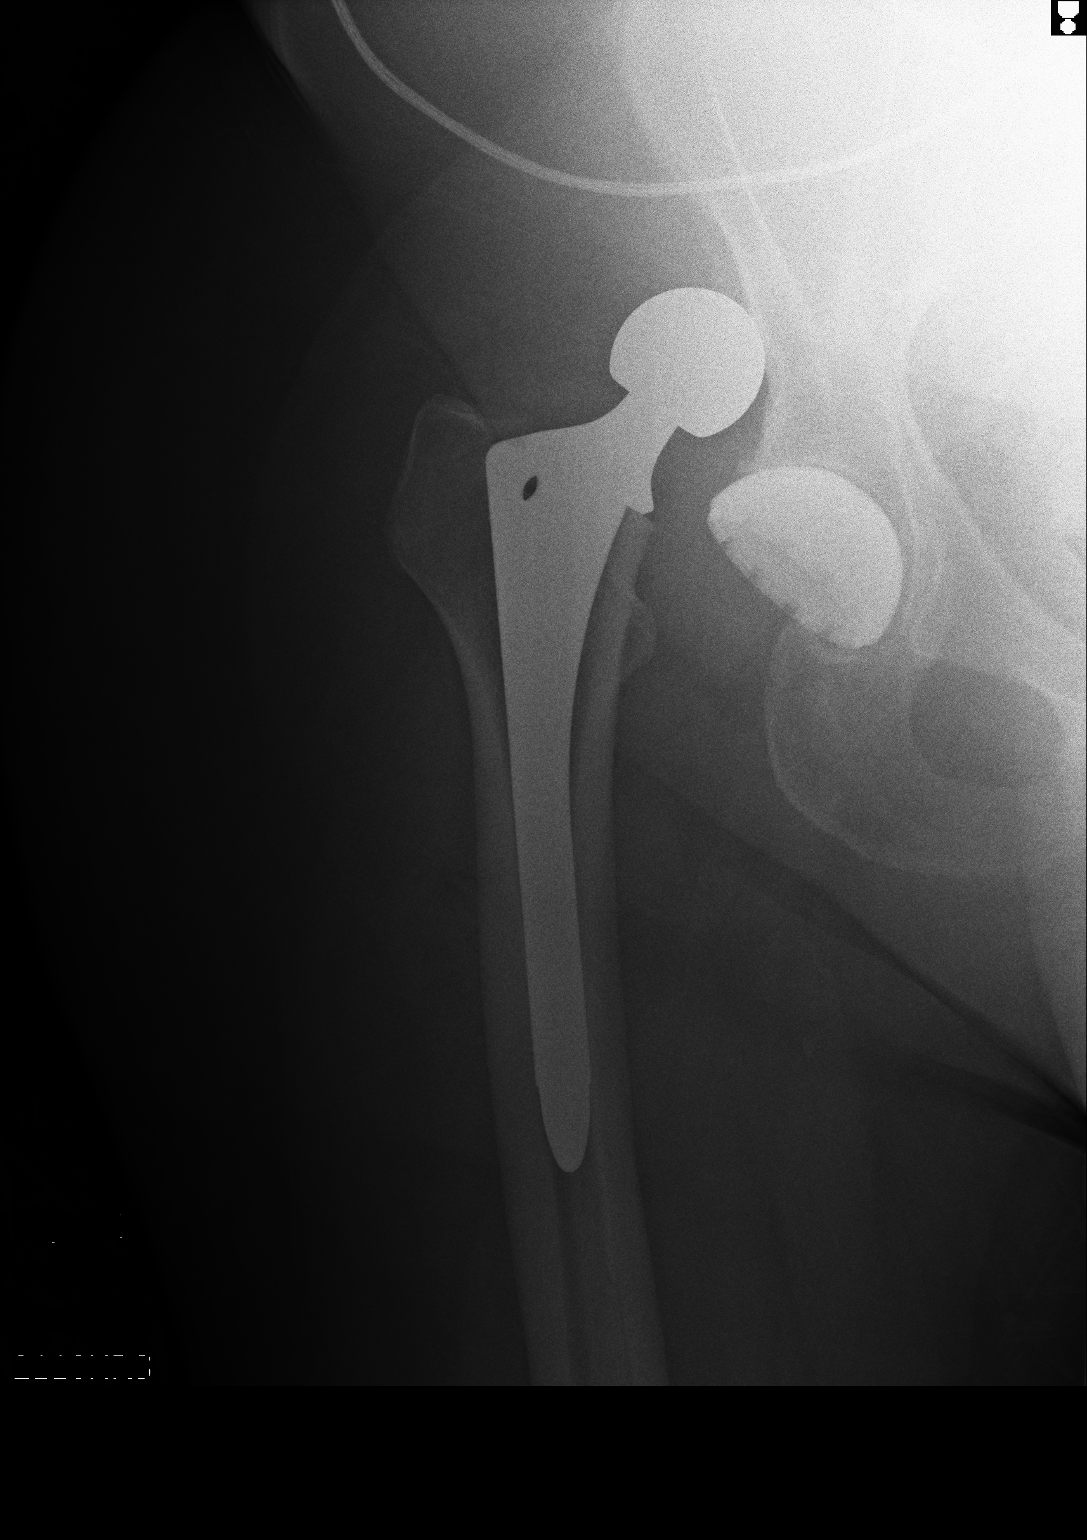
[im 2/2]
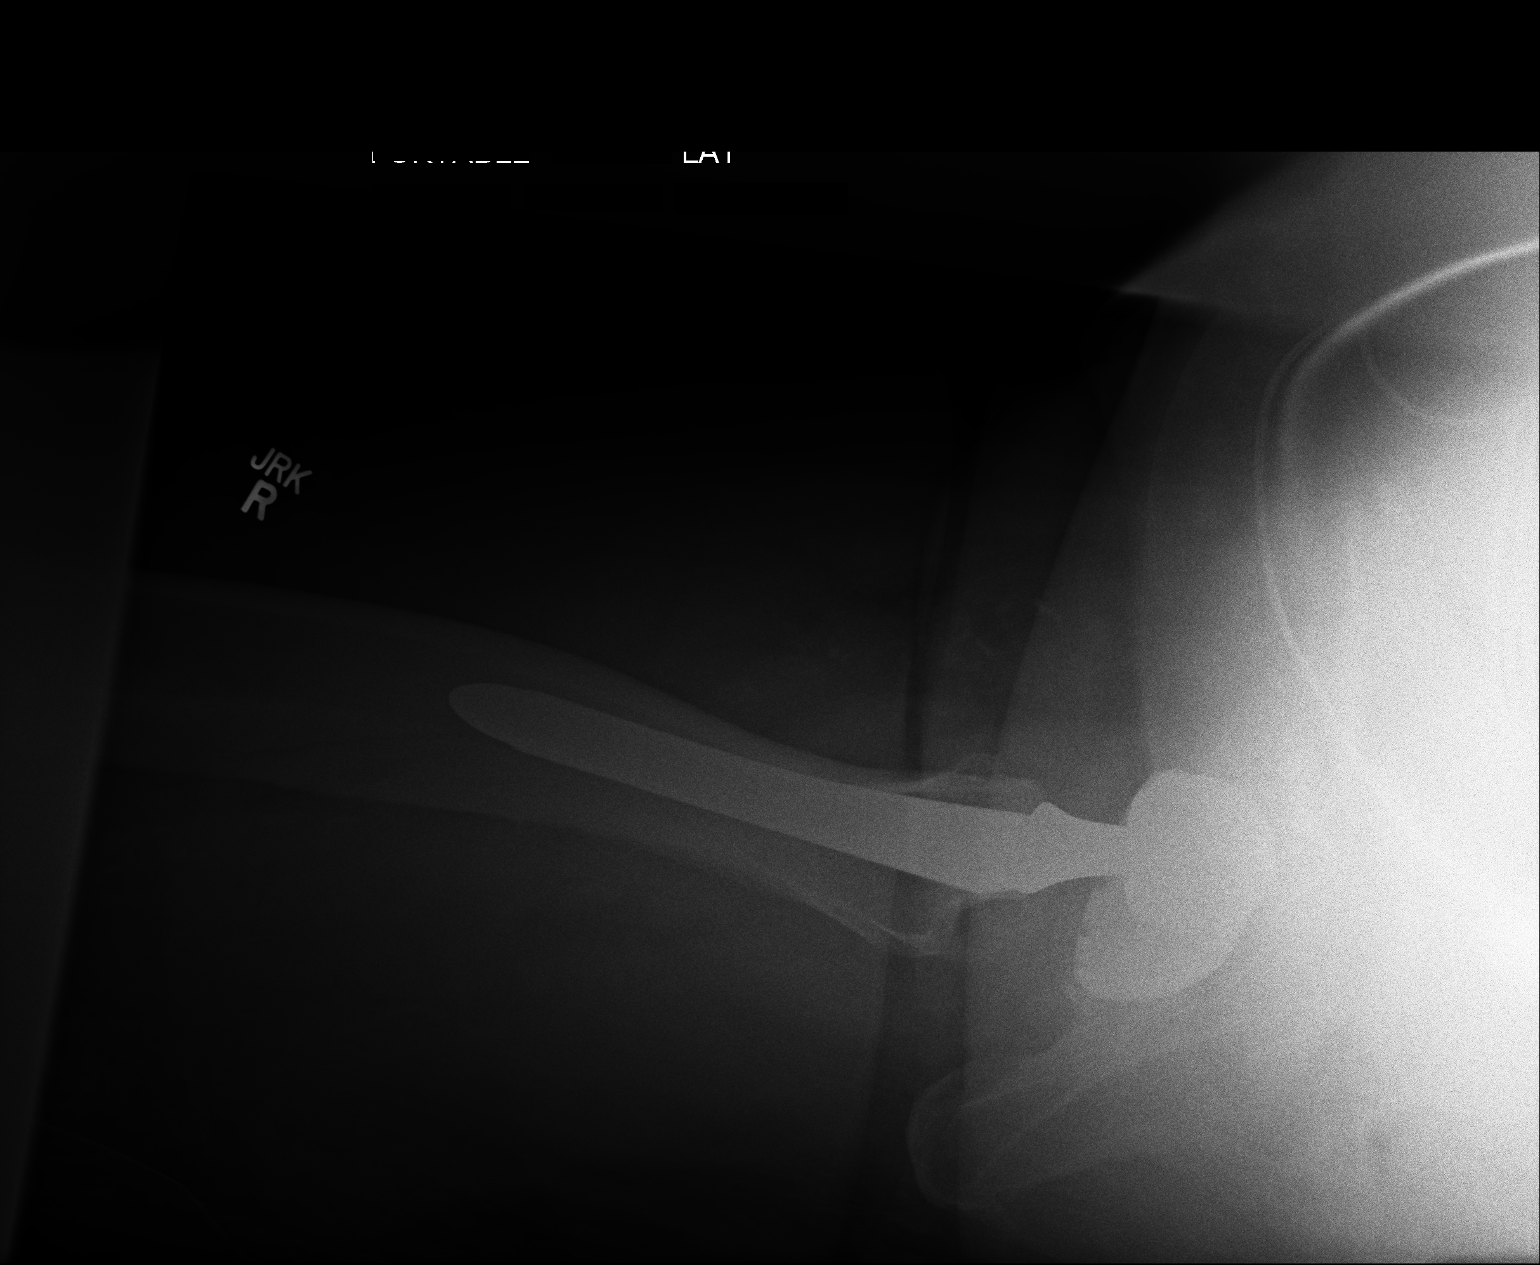

[2 of 2 positions shown; findings below may reference images not displayed]

FINDINGS: There is superior dislocation of the femoral arthroplasty. No definite
fracture seen.
IMPRESSION: Dislocation of the right hip arthroplasty.

[REDACTED]

## 2013-10-21 IMAGING — CR RIGHT HIP - COMPLETE 2+ VIEW
1 series · 2 of 2 positions shown · non-contrast
Comparison: none

REASON FOR EXAM: s/p right hip closed reduction peripros hip dislocation
COMMENTS:

PROCEDURE:     DXR - DXR HIP RIGHT COMPLETE  - February 02, 2013  [DATE]
RESULT:     Comparison: Earlier same day.

[Series 1: ap · 0.17mm/px · 2 of 2 slices shown]
[im 1/2]
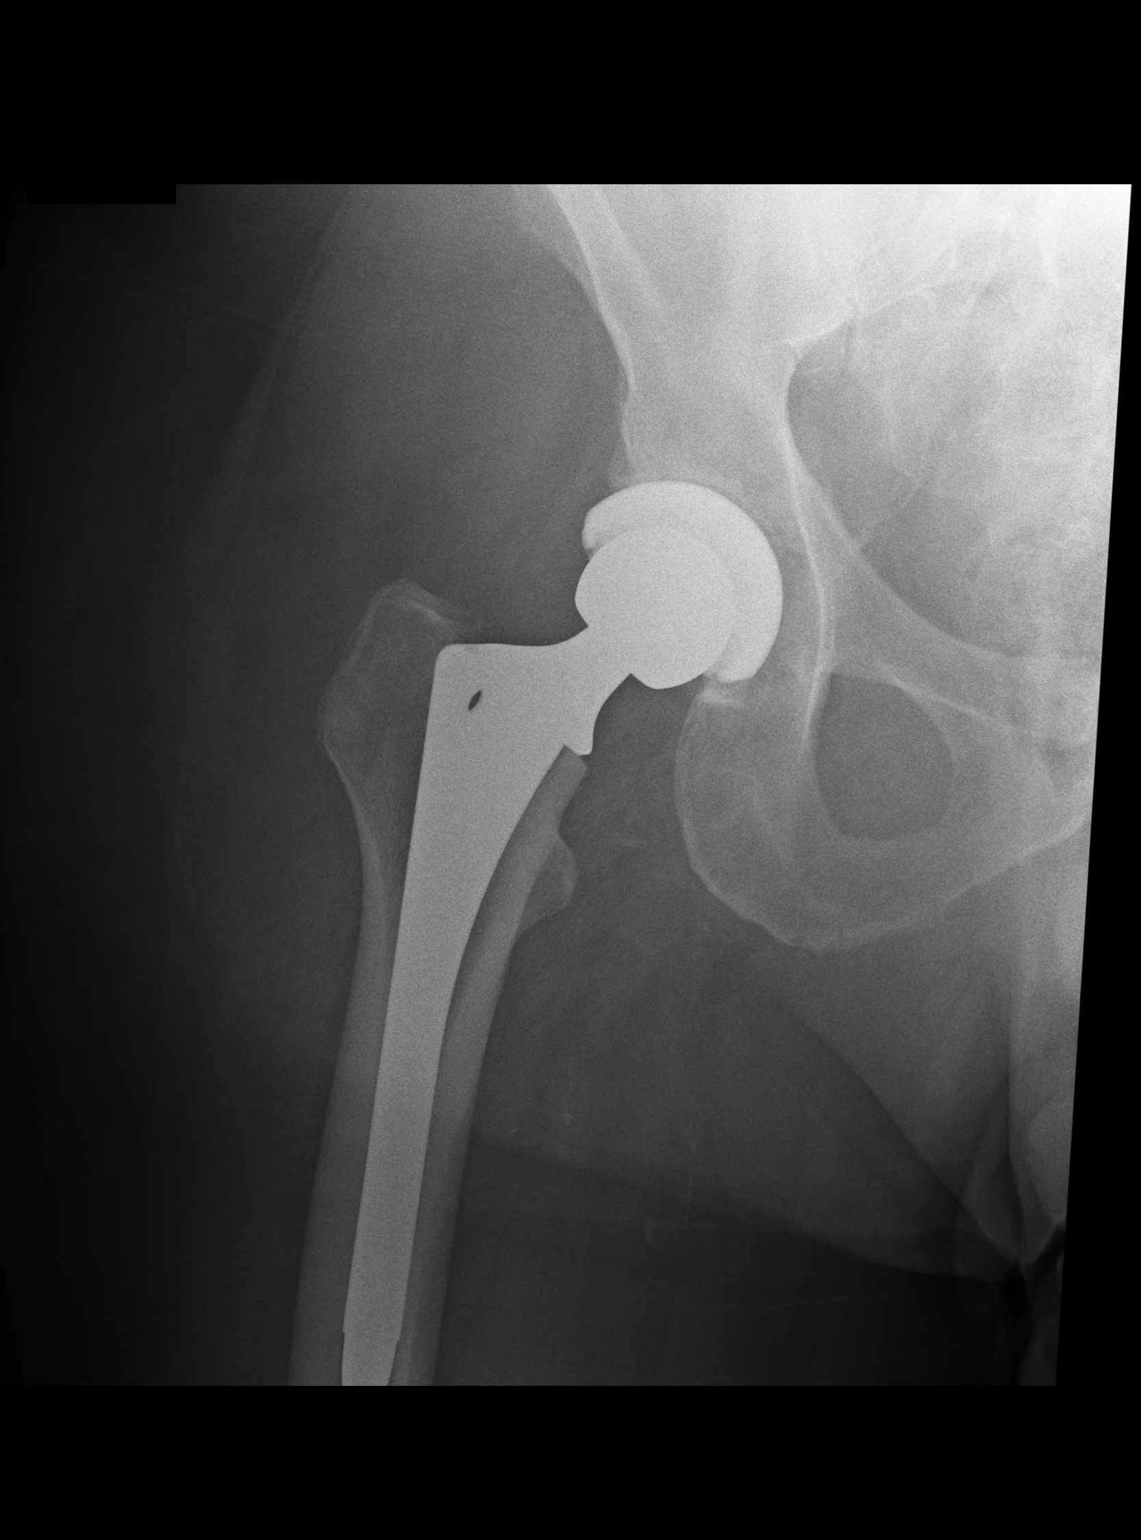
[im 2/2]
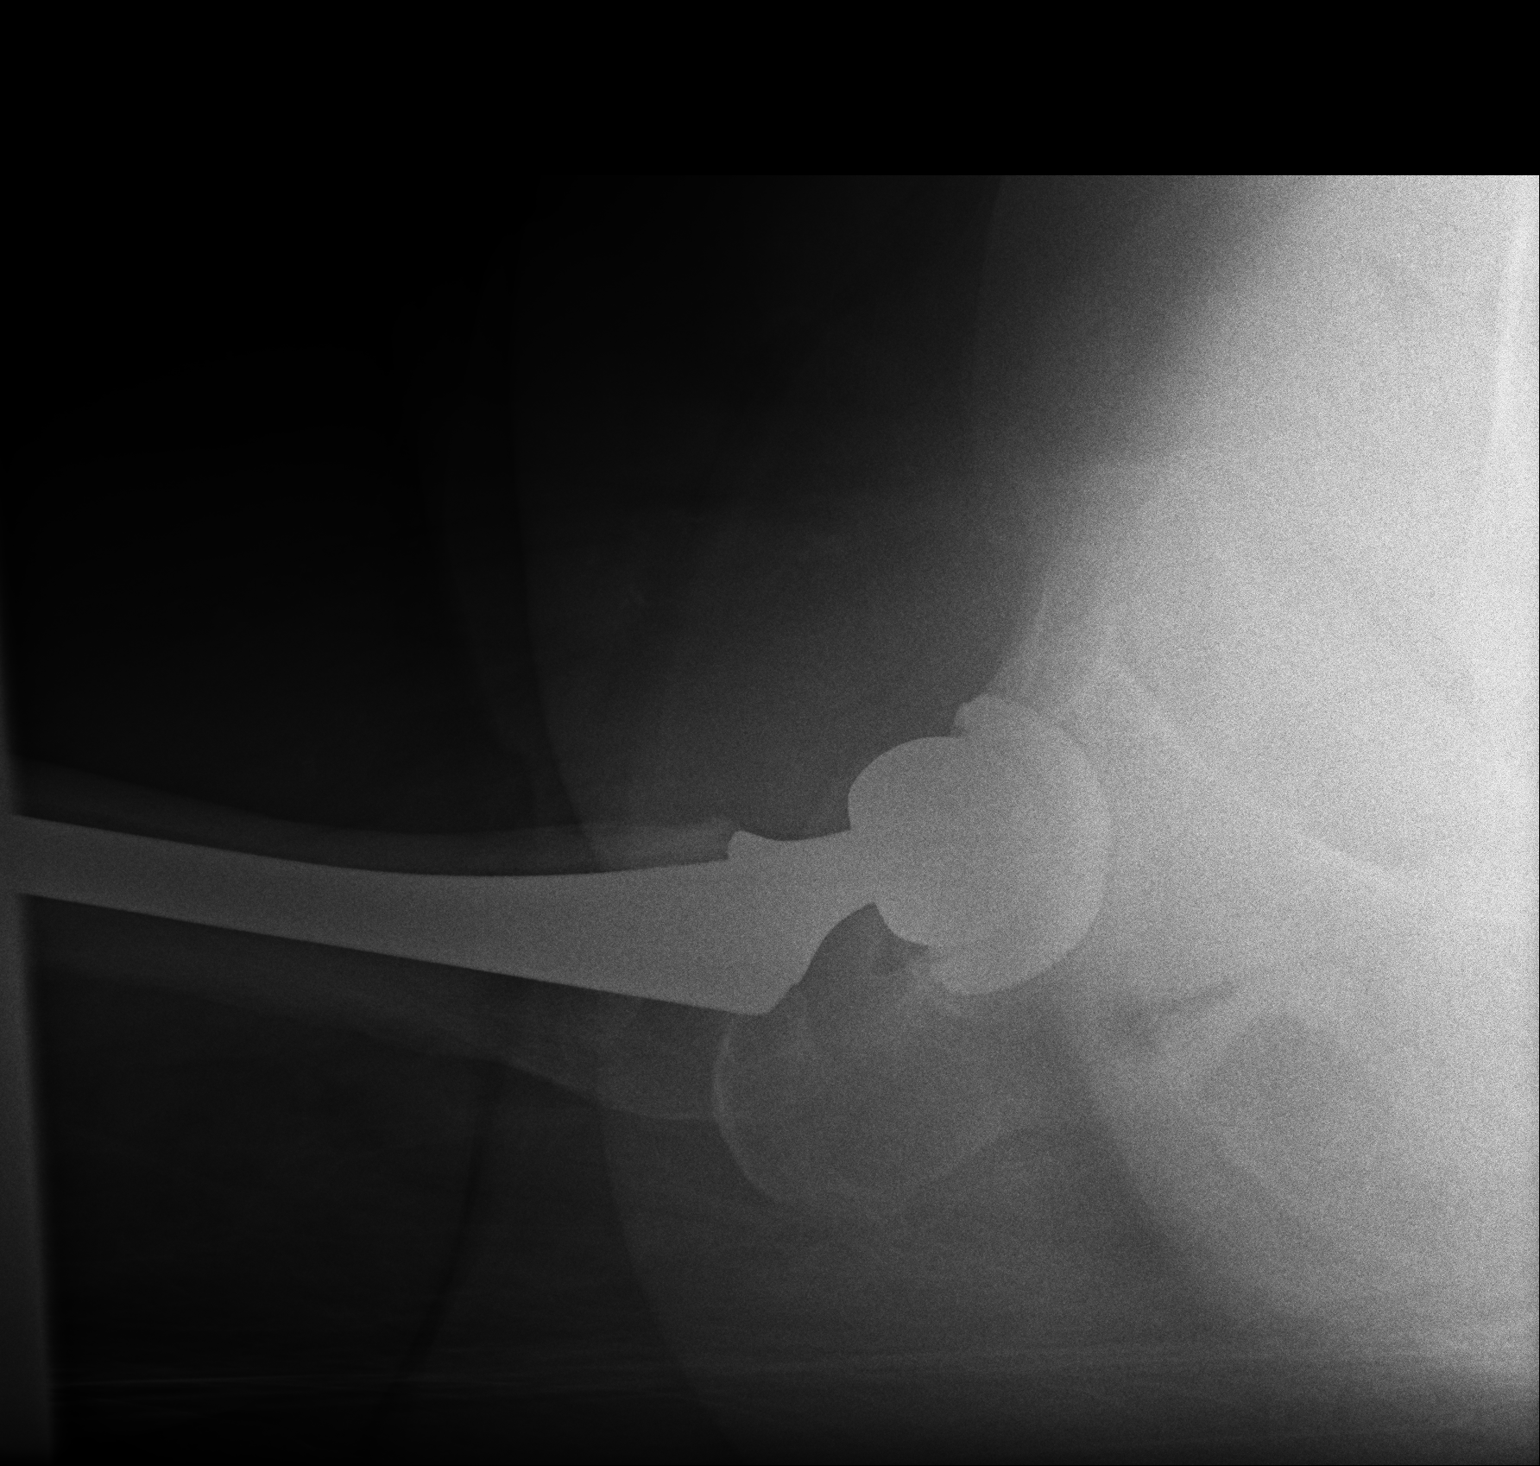

[2 of 2 positions shown; findings below may reference images not displayed]

FINDINGS: There is been interval relocation of the hip arthroplasty dislocation. No
acute fracture seen.
IMPRESSION: Reduction of the arthroplasty dislocation.

[REDACTED]

## 2013-11-13 DIAGNOSIS — M19019 Primary osteoarthritis, unspecified shoulder: Secondary | ICD-10-CM | POA: Diagnosis not present

## 2013-11-16 DIAGNOSIS — S59909A Unspecified injury of unspecified elbow, initial encounter: Secondary | ICD-10-CM | POA: Diagnosis not present

## 2013-11-16 DIAGNOSIS — M79609 Pain in unspecified limb: Secondary | ICD-10-CM | POA: Diagnosis not present

## 2013-11-26 DIAGNOSIS — IMO0002 Reserved for concepts with insufficient information to code with codable children: Secondary | ICD-10-CM | POA: Diagnosis not present

## 2013-11-26 DIAGNOSIS — M1710 Unilateral primary osteoarthritis, unspecified knee: Secondary | ICD-10-CM | POA: Diagnosis not present

## 2013-12-02 DIAGNOSIS — IMO0002 Reserved for concepts with insufficient information to code with codable children: Secondary | ICD-10-CM | POA: Diagnosis not present

## 2013-12-02 DIAGNOSIS — M1710 Unilateral primary osteoarthritis, unspecified knee: Secondary | ICD-10-CM | POA: Diagnosis not present

## 2013-12-02 DIAGNOSIS — M19019 Primary osteoarthritis, unspecified shoulder: Secondary | ICD-10-CM | POA: Diagnosis not present

## 2013-12-02 DIAGNOSIS — M25519 Pain in unspecified shoulder: Secondary | ICD-10-CM | POA: Diagnosis not present

## 2013-12-09 DIAGNOSIS — IMO0002 Reserved for concepts with insufficient information to code with codable children: Secondary | ICD-10-CM | POA: Diagnosis not present

## 2013-12-09 DIAGNOSIS — M1710 Unilateral primary osteoarthritis, unspecified knee: Secondary | ICD-10-CM | POA: Diagnosis not present

## 2013-12-12 DIAGNOSIS — I10 Essential (primary) hypertension: Secondary | ICD-10-CM | POA: Diagnosis not present

## 2013-12-15 DIAGNOSIS — R822 Biliuria: Secondary | ICD-10-CM | POA: Diagnosis not present

## 2013-12-15 DIAGNOSIS — N390 Urinary tract infection, site not specified: Secondary | ICD-10-CM | POA: Diagnosis not present

## 2014-01-02 DIAGNOSIS — N419 Inflammatory disease of prostate, unspecified: Secondary | ICD-10-CM | POA: Diagnosis not present

## 2014-01-02 DIAGNOSIS — R3915 Urgency of urination: Secondary | ICD-10-CM | POA: Diagnosis not present

## 2014-01-02 DIAGNOSIS — R300 Dysuria: Secondary | ICD-10-CM | POA: Diagnosis not present

## 2014-01-15 DIAGNOSIS — L821 Other seborrheic keratosis: Secondary | ICD-10-CM | POA: Diagnosis not present

## 2014-01-15 DIAGNOSIS — I789 Disease of capillaries, unspecified: Secondary | ICD-10-CM | POA: Diagnosis not present

## 2014-01-15 DIAGNOSIS — L923 Foreign body granuloma of the skin and subcutaneous tissue: Secondary | ICD-10-CM | POA: Diagnosis not present

## 2014-01-15 DIAGNOSIS — L570 Actinic keratosis: Secondary | ICD-10-CM | POA: Diagnosis not present

## 2014-02-11 DIAGNOSIS — M1710 Unilateral primary osteoarthritis, unspecified knee: Secondary | ICD-10-CM | POA: Diagnosis not present

## 2014-02-19 DIAGNOSIS — J309 Allergic rhinitis, unspecified: Secondary | ICD-10-CM | POA: Diagnosis not present

## 2014-02-19 DIAGNOSIS — I872 Venous insufficiency (chronic) (peripheral): Secondary | ICD-10-CM | POA: Diagnosis not present

## 2014-02-19 DIAGNOSIS — J329 Chronic sinusitis, unspecified: Secondary | ICD-10-CM | POA: Diagnosis not present

## 2014-02-19 DIAGNOSIS — N529 Male erectile dysfunction, unspecified: Secondary | ICD-10-CM | POA: Diagnosis not present

## 2014-02-27 DIAGNOSIS — M7989 Other specified soft tissue disorders: Secondary | ICD-10-CM | POA: Diagnosis not present

## 2014-02-27 DIAGNOSIS — M79609 Pain in unspecified limb: Secondary | ICD-10-CM | POA: Diagnosis not present

## 2014-02-27 DIAGNOSIS — I10 Essential (primary) hypertension: Secondary | ICD-10-CM | POA: Diagnosis not present

## 2014-03-30 DIAGNOSIS — R3915 Urgency of urination: Secondary | ICD-10-CM | POA: Diagnosis not present

## 2014-05-06 DIAGNOSIS — R609 Edema, unspecified: Secondary | ICD-10-CM | POA: Diagnosis not present

## 2014-05-06 DIAGNOSIS — I10 Essential (primary) hypertension: Secondary | ICD-10-CM | POA: Diagnosis not present

## 2014-05-06 DIAGNOSIS — M1990 Unspecified osteoarthritis, unspecified site: Secondary | ICD-10-CM | POA: Diagnosis not present

## 2014-05-06 DIAGNOSIS — Z01818 Encounter for other preprocedural examination: Secondary | ICD-10-CM | POA: Diagnosis not present

## 2014-05-06 DIAGNOSIS — E669 Obesity, unspecified: Secondary | ICD-10-CM | POA: Insufficient documentation

## 2014-05-06 DIAGNOSIS — R9431 Abnormal electrocardiogram [ECG] [EKG]: Secondary | ICD-10-CM | POA: Insufficient documentation

## 2014-05-06 DIAGNOSIS — F32A Depression, unspecified: Secondary | ICD-10-CM | POA: Insufficient documentation

## 2014-05-06 DIAGNOSIS — F329 Major depressive disorder, single episode, unspecified: Secondary | ICD-10-CM | POA: Insufficient documentation

## 2014-05-07 ENCOUNTER — Encounter: Payer: Self-pay | Admitting: Pulmonary Disease

## 2014-05-08 ENCOUNTER — Encounter: Payer: Self-pay | Admitting: Pulmonary Disease

## 2014-05-08 ENCOUNTER — Ambulatory Visit (INDEPENDENT_AMBULATORY_CARE_PROVIDER_SITE_OTHER): Payer: Medicare Other | Admitting: Pulmonary Disease

## 2014-05-08 VITALS — BP 142/82 | HR 58 | Temp 97.9°F | Ht 75.0 in | Wt 256.0 lb

## 2014-05-08 DIAGNOSIS — G4733 Obstructive sleep apnea (adult) (pediatric): Secondary | ICD-10-CM | POA: Diagnosis not present

## 2014-05-08 NOTE — Patient Instructions (Signed)
Schedule home sleep study.   

## 2014-05-08 NOTE — Progress Notes (Signed)
Subjective:    Patient ID: [facility] Joe Patton., male    DOB: 1946/02/11, 68 y.o.   MRN: [id number]  HPI  68 year old ex-smoker so referred for evaluation of sleep disordered breathing. He is spoken to a friend of his, a patient of ours, and is interested in a home sleep study.He got married earlier this year, and wife has noted mild snoring and witnessed apneas followed by shaking of his extremities. He sleeps in a recliner due to chronic left shoulder pain and is a mouth breather. He is lost 40 pounds to his current weight of 256. Epworth sleepiness score is 6. Bedtime is around 11 PM, sleep latency is about 30 minutes, was recliner was back 45, he uses a doughnut pillow, reports one to 2 nocturnal awakenings for nocturia and is out of bed by 8 AM with occasional dryness of mouth, denies morning headaches. There is no history suggestive of cataplexy, sleep paralysis or parasomnias  He is likely to be scheduled for rotator cuff repair and the Zetia to get a sleep apnea evaluation done prior to that  Past Medical History  Diagnosis Date  . Viral warts   . Hypertension   . Arthritis   . Actinic keratosis   . Osteoarthrosis   . Erectile dysfunction   . Degeneration, intervertebral disc, lumbosacral   . Bilirubinuria   . Sinusitis   . Allergic rhinitis   . Eustachian tube dysfunction   . BPH (benign prostatic hyperplasia)    Past Surgical History  Procedure Laterality Date  . Cataract extraction      right  . Gallbladder surgery    . Hip surgery      left  . Tonsillectomy      Allergies  Allergen Reactions  . Latex Rash    History   Social History  . Marital Status: Married    Spouse Name: N/A    Number of Children: N/A  . Years of Education: N/A   Occupational History  . retired    Social History Main Topics  . Smoking status: Former Smoker -- 1.50 packs/day for 20 years    Types: Cigarettes    Quit date: 10/03/2007  . Smokeless tobacco: Not on file  .  Alcohol Use: No  . Drug Use: No  . Sexual Activity: Not on file   Other Topics Concern  . Not on file   Social History Narrative  . No narrative on file    Family History  Problem Relation Age of Onset  . Breast cancer Mother   . Hypertension Father   . Heart attack Father      Review of Systems neg for any significant sore throat, dysphagia, itching, sneezing, nasal congestion or excess/ purulent secretions, fever, chills, sweats, unintended wt loss, pleuritic or exertional cp, hempoptysis, orthopnea pnd or change in chronic leg swelling. Also denies presyncope, palpitations, heartburn, abdominal pain, nausea, vomiting, diarrhea or change in bowel or urinary habits, dysuria,hematuria, rash, arthralgias, visual complaints, headache, numbness weakness or ataxia.     Objective:   Physical Exam  Gen. Pleasant, obese, in no distress, normal affect ENT - no lesions, no post nasal drip, class 2-3 airway Neck: No JVD, no thyromegaly, no carotid bruits Lungs: no use of accessory muscles, no dullness to percussion, decreased without rales or rhonchi  Cardiovascular: Rhythm regular, heart sounds  normal, no murmurs or gallops, no peripheral edema Abdomen: soft and non-tender, no hepatosplenomegaly, BS normal. Musculoskeletal: No deformities, no cyanosis or clubbing Neuro:  alert, non focal, no tremors       Assessment & Plan:

## 2014-05-08 NOTE — Assessment & Plan Note (Signed)
Given excessive daytime somnolence, narrow pharyngeal exam, witnessed apneas & loud snoring, obstructive sleep apnea is very likely & an overnight polysomnogram will be scheduled as a home study. The pathophysiology of obstructive sleep apnea , it's cardiovascular consequences & modes of treatment including CPAP were discused with the patient in detail & they evidenced understanding.  

## 2014-05-21 DIAGNOSIS — IMO0002 Reserved for concepts with insufficient information to code with codable children: Secondary | ICD-10-CM | POA: Diagnosis not present

## 2014-05-21 DIAGNOSIS — M19019 Primary osteoarthritis, unspecified shoulder: Secondary | ICD-10-CM | POA: Diagnosis not present

## 2014-05-21 DIAGNOSIS — M1710 Unilateral primary osteoarthritis, unspecified knee: Secondary | ICD-10-CM | POA: Insufficient documentation

## 2014-05-21 DIAGNOSIS — M25569 Pain in unspecified knee: Secondary | ICD-10-CM | POA: Diagnosis not present

## 2014-05-29 ENCOUNTER — Telehealth: Payer: Self-pay | Admitting: Pulmonary Disease

## 2014-05-29 NOTE — Telephone Encounter (Signed)
Looks like the order was placed for the pt to be set up for home sleep study at his last ov with RA on 05/08/14.  Pt wanting to know when he will be able to do this test.  [facility] please advise. thanks

## 2014-06-01 NOTE — Telephone Encounter (Signed)
Left message for patient that I am sending patient's message to [facility] marked high priority so they can contact him today by 4:30pm.

## 2014-06-01 NOTE — Telephone Encounter (Signed)
Advised patient that we were running about 3 wks out on hst. Went and pulled order. No pre cert required and called patient to schedule HST. Pt will come to pick up device on Thurs 06/04/14. Pt is aware to come at 3:00 pm and ask for Rhonda at front desk. Nothing else needed at this time. Pt is aware of this appointment. Rhonda J Cobb

## 2014-06-01 NOTE — Telephone Encounter (Signed)
Pt hasn't heard anything back yet & asks to be contacted at home # as listed above.  Joe Patton

## 2014-06-04 DIAGNOSIS — G4733 Obstructive sleep apnea (adult) (pediatric): Secondary | ICD-10-CM | POA: Diagnosis not present

## 2014-06-16 ENCOUNTER — Telehealth: Payer: Self-pay | Admitting: Pulmonary Disease

## 2014-06-16 NOTE — Telephone Encounter (Signed)
Home study showed moderate OSA, stopped breathing 18 per hour Schedule CPAP titration study if he is willing

## 2014-06-17 DIAGNOSIS — G4733 Obstructive sleep apnea (adult) (pediatric): Secondary | ICD-10-CM | POA: Diagnosis not present

## 2014-06-17 NOTE — Telephone Encounter (Addendum)
Pt is asking to speak w/ RA.  Pt he is not able to go to the sleep lab d/t handicap issues to complete an auto-cpap titration study.  States RA is aware of this.  Pt states he is awaiting surgery clearance. [phone].  Satira Anis

## 2014-06-17 NOTE — Telephone Encounter (Signed)
Spoke with the pt and notified of recs per RA  He verbalized understanding  He states that he is needing to ask his friend which compnay he uses, b/c he wants to go with that one  He will find out and call us back

## 2014-06-17 NOTE — Telephone Encounter (Signed)
Can trial autoCPAP 5-12, mask of choice humidity, download in 4 weeks  & pl arrange FU OV

## 2014-06-17 NOTE — Telephone Encounter (Signed)
Called pt. Aware of results. He does not want to go to lab and have CPAP titration study done. He reports he has too many health issues going on to risk a nights worth of sleep. He wants RX for him to take and get his own CPAP. Please advise thanks

## 2014-06-18 ENCOUNTER — Encounter: Payer: Self-pay | Admitting: Pulmonary Disease

## 2014-07-07 DIAGNOSIS — M170 Bilateral primary osteoarthritis of knee: Secondary | ICD-10-CM | POA: Insufficient documentation

## 2014-07-08 DIAGNOSIS — M19012 Primary osteoarthritis, left shoulder: Secondary | ICD-10-CM | POA: Diagnosis not present

## 2014-07-09 ENCOUNTER — Telehealth: Payer: Self-pay | Admitting: Pulmonary Disease

## 2014-07-09 DIAGNOSIS — G4733 Obstructive sleep apnea (adult) (pediatric): Secondary | ICD-10-CM

## 2014-07-09 NOTE — Telephone Encounter (Signed)
Called pt. He is calling to get his order for CPAP sent to [facility]. I have placed order. appt scheduled for f/u 11/30. Nothing further needed

## 2014-07-10 ENCOUNTER — Other Ambulatory Visit: Payer: Self-pay | Admitting: Orthopedic Surgery

## 2014-07-10 DIAGNOSIS — M25512 Pain in left shoulder: Secondary | ICD-10-CM

## 2014-07-22 ENCOUNTER — Ambulatory Visit
Admission: RE | Admit: 2014-07-22 | Discharge: 2014-07-22 | Disposition: A | Discharge status: Home / Self Care | Payer: Medicare Other | Source: Ambulatory Visit | Attending: Orthopedic Surgery | Admitting: Orthopedic Surgery

## 2014-07-22 DIAGNOSIS — M19012 Primary osteoarthritis, left shoulder: Secondary | ICD-10-CM | POA: Diagnosis not present

## 2014-07-22 DIAGNOSIS — M25512 Pain in left shoulder: Secondary | ICD-10-CM

## 2014-07-27 DIAGNOSIS — M19012 Primary osteoarthritis, left shoulder: Secondary | ICD-10-CM | POA: Diagnosis not present

## 2014-08-07 ENCOUNTER — Other Ambulatory Visit: Payer: Self-pay | Admitting: Orthopedic Surgery

## 2014-08-17 DIAGNOSIS — Z23 Encounter for immunization: Secondary | ICD-10-CM | POA: Diagnosis not present

## 2014-08-17 DIAGNOSIS — Z01818 Encounter for other preprocedural examination: Secondary | ICD-10-CM | POA: Diagnosis not present

## 2014-08-25 ENCOUNTER — Ambulatory Visit (HOSPITAL_COMMUNITY)
Admission: RE | Admit: 2014-08-25 | Discharge: 2014-08-25 | Disposition: A | Discharge status: Home / Self Care | Payer: Medicare Other | Source: Ambulatory Visit | Attending: Orthopedic Surgery | Admitting: Orthopedic Surgery

## 2014-08-25 ENCOUNTER — Encounter (HOSPITAL_COMMUNITY): Payer: Self-pay

## 2014-08-25 ENCOUNTER — Encounter (HOSPITAL_COMMUNITY)
Admission: RE | Admit: 2014-08-25 | Discharge: 2014-08-25 | Disposition: A | Discharge status: Home / Self Care | Payer: Medicare Other | Source: Ambulatory Visit | Attending: Orthopedic Surgery | Admitting: Orthopedic Surgery

## 2014-08-25 DIAGNOSIS — Z01818 Encounter for other preprocedural examination: Secondary | ICD-10-CM | POA: Insufficient documentation

## 2014-08-25 DIAGNOSIS — Z9049 Acquired absence of other specified parts of digestive tract: Secondary | ICD-10-CM | POA: Diagnosis not present

## 2014-08-25 DIAGNOSIS — G4733 Obstructive sleep apnea (adult) (pediatric): Secondary | ICD-10-CM | POA: Insufficient documentation

## 2014-08-25 DIAGNOSIS — K219 Gastro-esophageal reflux disease without esophagitis: Secondary | ICD-10-CM | POA: Diagnosis not present

## 2014-08-25 DIAGNOSIS — I10 Essential (primary) hypertension: Secondary | ICD-10-CM | POA: Insufficient documentation

## 2014-08-25 DIAGNOSIS — M1990 Unspecified osteoarthritis, unspecified site: Secondary | ICD-10-CM | POA: Insufficient documentation

## 2014-08-25 DIAGNOSIS — Z87891 Personal history of nicotine dependence: Secondary | ICD-10-CM | POA: Diagnosis not present

## 2014-08-25 DIAGNOSIS — F419 Anxiety disorder, unspecified: Secondary | ICD-10-CM | POA: Diagnosis not present

## 2014-08-25 DIAGNOSIS — I4510 Unspecified right bundle-branch block: Secondary | ICD-10-CM | POA: Insufficient documentation

## 2014-08-25 DIAGNOSIS — M797 Fibromyalgia: Secondary | ICD-10-CM | POA: Insufficient documentation

## 2014-08-25 DIAGNOSIS — N400 Enlarged prostate without lower urinary tract symptoms: Secondary | ICD-10-CM | POA: Insufficient documentation

## 2014-08-25 DIAGNOSIS — E669 Obesity, unspecified: Secondary | ICD-10-CM | POA: Diagnosis not present

## 2014-08-25 HISTORY — DX: Anxiety disorder, unspecified: F41.9

## 2014-08-25 HISTORY — DX: Sleep apnea, unspecified: G47.30

## 2014-08-25 HISTORY — DX: Fibromyalgia: M79.7

## 2014-08-25 HISTORY — DX: Gastro-esophageal reflux disease without esophagitis: K21.9

## 2014-08-25 LAB — URINALYSIS, ROUTINE W REFLEX MICROSCOPIC
BILIRUBIN URINE: NEGATIVE
Glucose, UA: NEGATIVE mg/dL
HGB URINE DIPSTICK: NEGATIVE
Ketones, ur: NEGATIVE mg/dL
Leukocytes, UA: NEGATIVE
Nitrite: NEGATIVE
PROTEIN: NEGATIVE mg/dL
Specific Gravity, Urine: 1.014 (ref 1.005–1.030)
Urobilinogen, UA: 0.2 mg/dL (ref 0.0–1.0)
pH: 6 (ref 5.0–8.0)

## 2014-08-25 LAB — COMPREHENSIVE METABOLIC PANEL
ALBUMIN: 4.1 g/dL (ref 3.5–5.2)
ALT: 21 U/L (ref 0–53)
ANION GAP: 13 (ref 5–15)
AST: 21 U/L (ref 0–37)
Alkaline Phosphatase: 84 U/L (ref 39–117)
BUN: 12 mg/dL (ref 6–23)
CALCIUM: 9.6 mg/dL (ref 8.4–10.5)
CO2: 27 meq/L (ref 19–32)
CREATININE: 0.86 mg/dL (ref 0.50–1.35)
Chloride: 103 mEq/L (ref 96–112)
GFR calc Af Amer: >90 mL/min (ref >90)
GFR calc non Af Amer: 88 mL/min — ABNORMAL LOW (ref >90)
GLUCOSE: 97 mg/dL (ref 70–99)
Potassium: 3.3 mEq/L — ABNORMAL LOW (ref 3.7–5.3)
Sodium: 143 mEq/L (ref 137–147)
Total Bilirubin: 0.5 mg/dL (ref 0.3–1.2)
Total Protein: 7.4 g/dL (ref 6.0–8.3)

## 2014-08-25 LAB — CBC WITH DIFFERENTIAL/PLATELET
Basophils Absolute: 0 10*3/uL (ref 0.0–0.1)
Basophils Relative: 1 % (ref 0–1)
EOS PCT: 4 % (ref 0–5)
Eosinophils Absolute: 0.3 10*3/uL (ref 0.0–0.7)
HEMATOCRIT: 40.9 % (ref 39.0–52.0)
HEMOGLOBIN: 13.8 g/dL (ref 13.0–17.0)
LYMPHS ABS: 2 10*3/uL (ref 0.7–4.0)
Lymphocytes Relative: 30 % (ref 12–46)
MCH: 30.1 pg (ref 26.0–34.0)
MCHC: 33.7 g/dL (ref 30.0–36.0)
MCV: 89.3 fL (ref 78.0–100.0)
MONO ABS: 0.5 10*3/uL (ref 0.1–1.0)
MONOS PCT: 8 % (ref 3–12)
Neutro Abs: 3.9 10*3/uL (ref 1.7–7.7)
Neutrophils Relative %: 57 % (ref 43–77)
Platelets: 227 10*3/uL (ref 150–400)
RBC: 4.58 MIL/uL (ref 4.22–5.81)
RDW: 13.8 % (ref 11.5–15.5)
WBC: 6.7 10*3/uL (ref 4.0–10.5)

## 2014-08-25 LAB — TYPE AND SCREEN
ABO/RH(D): A POS
Antibody Screen: NEGATIVE

## 2014-08-25 LAB — APTT: APTT: 28 s (ref 24–37)

## 2014-08-25 LAB — PROTIME-INR
INR: 1.02 (ref 0.00–1.49)
Prothrombin Time: 13.5 seconds (ref 11.6–15.2)

## 2014-08-25 LAB — ABO/RH: ABO/RH(D): A POS

## 2014-08-25 NOTE — Pre-Procedure Instructions (Addendum)
[place].  08/25/2014   Your procedure is scheduled on:  Thursday, September 03, 2014  Report to [facility] Admitting at 8:45 AM.  Call this number if you have problems the morning of surgery: [phone]   Remember:   Do not eat food or drink liquids after midnight Wednesday, September 02, 2014   Take these medicines the morning of surgery with A SIP OF WATER: citalopram (CELEXA), labetalol (NORMODYNE), tamsulosin (FLOMAX), traMADol (ULTRAM), if needed:fluticasone (FLONASE) nasal spray for allergies  Stop taking Aspirin, vitamins, and herbal medications such as Omega-3 Fatty Acids (FISH OIL PO) and vitamin E.  Do not take any NSAIDs ie: Ibuprofen, Advil, Naproxen or any medication containing Aspirin such as etodolac (LODINE); stop 5 days prior to procedure ( Saturday, August 29, 2014 )   Do not wear jewelry, make-up or nail polish.  Do not wear lotions, powders, or perfumes. You may not wear deodorant.  Do not shave 48 hours prior to surgery. Men may shave face and neck.  Do not bring valuables to the hospital.  [facility] is not responsible for any belongings or valuables.               Contacts, dentures or bridgework may not be worn into surgery.  Leave suitcase in the car. After surgery it may be brought to your room.  For patients admitted to the hospital, discharge time is determined by your treatment team.               Patients discharged the day of surgery will not be allowed to drive home.  Name and phone number of your driver:   Special Instructions:  Special Instructions:Special Instructions: [facility] - Preparing for Surgery  Before surgery, you can play an important role.  Because skin is not sterile, your skin needs to be as free of germs as possible.  You can reduce the number of germs on you skin by washing with CHG (chlorahexidine gluconate) soap before surgery.  CHG is an antiseptic cleaner which kills germs and bonds with the skin to continue  killing germs even after washing.  Please DO NOT use if you have an allergy to CHG or antibacterial soaps.  If your skin becomes reddened/irritated stop using the CHG and inform your nurse when you arrive at Short Stay.  Do not shave (including legs and underarms) for at least 48 hours prior to the first CHG shower.  You may shave your face.  Please follow these instructions carefully:   1.  Shower with CHG Soap the night before surgery and the morning of Surgery.  2.  If you choose to wash your hair, wash your hair first as usual with your normal shampoo.  3.  After you shampoo, rinse your hair and body thoroughly to remove the Shampoo.  4.  Use CHG as you would any other liquid soap.  You can apply chg directly  to the skin and wash gently with scrungie or a clean washcloth.  5.  Apply the CHG Soap to your body ONLY FROM THE NECK DOWN.  Do not use on open wounds or open sores.  Avoid contact with your eyes, ears, mouth and genitals (private parts).  Wash genitals (private parts) with your normal soap.  6.  Wash thoroughly, paying special attention to the area where your surgery will be performed.  7.  Thoroughly rinse your body with warm water from the neck down.  8.  DO NOT shower/wash  with your normal soap after using and rinsing off the CHG Soap.  9.  Pat yourself dry with a clean towel.            10.  Wear clean pajamas.            11.  Place clean sheets on your bed the night of your first shower and do not sleep with pets.  Day of Surgery  Do not apply any lotions/deodorants the morning of surgery.  Please wear clean clothes to the hospital/surgery center.   Please read over the following fact sheets that you were given: Pain Booklet, Coughing and Deep Breathing, Blood Transfusion Information and Surgical Site Infection Prevention

## 2014-08-26 NOTE — Progress Notes (Addendum)
Anesthesia Chart Review:  Patient is a 68 year old male scheduled for left total shoulder replacement on 09/03/14 by Dr. Tamera Punt.  History includes former smoker, HTN, OSA with CPAP (Dr. Elsworth Soho), fibromyalgia, GERD, BPH, anxiety, arthritis, bilateral THA, cholecystectomy, tonsillectomy. BMI is consistent with mild obesity. PCP is Dr. Otilio Miu at [facility] preoperative evaluation on 08/17/14.  EKG on 08/17/14 (PCP): NSR, LAD, right BBB, non-specific ST/T wave abnormality.     Preoperative CXR and labs noted.   Carla at Dr. Bettina Gavia office will fax formal clearance letter once received from Dr. Ronnald Ramp.  Chart will be planned for follow-up.  George Hugh [facility] Short Stay Center/Anesthesiology Phone [phone] 08/26/2014 10:35 AM  Addendum: Received a phone call from Baxter Flattery at Dr. Ronnald Ramp office.  Dr. Ronnald Ramp' felt patient's EKG was stable since 2011 and medically cleared patient for surgery.  George Hugh [facility] Short Stay Center/Anesthesiology Phone [phone] 09/01/2014 3:36 PM

## 2014-08-31 ENCOUNTER — Encounter: Payer: Self-pay | Admitting: Pulmonary Disease

## 2014-08-31 ENCOUNTER — Ambulatory Visit (INDEPENDENT_AMBULATORY_CARE_PROVIDER_SITE_OTHER): Payer: Medicare Other | Admitting: Pulmonary Disease

## 2014-08-31 VITALS — BP 130/80 | HR 65 | Ht 76.0 in | Wt 260.6 lb

## 2014-08-31 DIAGNOSIS — G4733 Obstructive sleep apnea (adult) (pediatric): Secondary | ICD-10-CM

## 2014-08-31 NOTE — Patient Instructions (Signed)
You have moderate obstructive sleep apnea corrected by CPAP 12 cm Cleared for surgery OK to use nasal pads or guaze to avoid direct contact over bridge of nose

## 2014-08-31 NOTE — Progress Notes (Signed)
   Subjective:    Patient ID: [facility] Joe Patton., male    DOB: 1945/11/06, 68 y.o.   MRN: [id number]  HPI  69 year old ex-smoker for FU of OSA. He got married in 2015 , and wife has noted mild snoring and witnessed apneas followed by shaking of his extremities. He sleeps in a recliner due to chronic left shoulder pain and is a mouth breather. He  lost 40 pounds to his current weight of 256.   Home study showed moderate OSA, stopped breathing 18 per hour  Chief Complaint  Patient presents with  . Follow-up    wears CPAP every night at auto 5-12cmh2o; concerned about mask being latex, having a rash across nose.  pt allergic to latex   Download on auto 5-12 11/30 /15 >> good usgae, residual AHI 7/h avg pr 12 cm, mask ok - c/o redness over nose - has latex allergy No dryness,  He is scheduled for rotator cuff repair this week  Review of Systems neg for any significant sore throat, dysphagia, itching, sneezing, nasal congestion or excess/ purulent secretions, fever, chills, sweats, unintended wt loss, pleuritic or exertional cp, hempoptysis, orthopnea pnd or change in chronic leg swelling. Also denies presyncope, palpitations, heartburn, abdominal pain, nausea, vomiting, diarrhea or change in bowel or urinary habits, dysuria,hematuria, rash, arthralgias, visual complaints, headache, numbness weakness or ataxia.     Objective:   Physical Exam  Gen. Pleasant, obese, in no distress ENT - no lesions, no post nasal drip Neck: No JVD, no thyromegaly, no carotid bruits Lungs: no use of accessory muscles, no dullness to percussion, decreased without rales or rhonchi  Cardiovascular: Rhythm regular, heart sounds  normal, no murmurs or gallops, no peripheral edema Musculoskeletal: No deformities, no cyanosis or clubbing , no tremors        Assessment & Plan:

## 2014-08-31 NOTE — Assessment & Plan Note (Signed)
You have moderate obstructive sleep apnea corrected by CPAP 12 cm Cleared for surgery OK to use nasal pads or guaze to avoid direct contact over bridge of nose -avoid latex  Weight loss encouraged, compliance with goal of at least 4-6 hrs every night is the expectation. Advised against medications with sedative side effects Cautioned against driving when sleepy - understanding that sleepiness will vary on a day to day basis

## 2014-09-02 MED ORDER — CEFAZOLIN SODIUM-DEXTROSE 2-3 GM-% IV SOLR
2.0000 g | INTRAVENOUS | Status: AC
  Administered 2014-09-03: 2 g via INTRAVENOUS
  Filled 2014-09-02: qty 50

## 2014-09-02 NOTE — Anesthesia Preprocedure Evaluation (Signed)
Anesthesia Evaluation  Patient identified by MRN, date of birth, ID band Patient awake    Reviewed: Allergy & Precautions, H&P , NPO status , Patient's Chart, lab work & pertinent test results, reviewed documented beta blocker date and time   Airway        Dental   Pulmonary sleep apnea ,          Cardiovascular hypertension, Pt. on medications     Neuro/Psych    GI/Hepatic GERD-  ,  Endo/Other    Renal/GU      Musculoskeletal   Abdominal   Peds  Hematology   Anesthesia Other Findings   Reproductive/Obstetrics                             Anesthesia Physical Anesthesia Plan  ASA: III  Anesthesia Plan: General   Post-op Pain Management: MAC Combined w/ Regional for Post-op pain   Induction: Intravenous  Airway Management Planned: Oral ETT  Additional Equipment:   Intra-op Plan:   Post-operative Plan: Extubation in OR  Informed Consent: I have reviewed the patients History and Physical, chart, labs and discussed the procedure including the risks, benefits and alternatives for the proposed anesthesia with the patient or authorized representative who has indicated his/her understanding and acceptance.     Plan Discussed with:   Anesthesia Plan Comments:         Anesthesia Quick Evaluation

## 2014-09-03 ENCOUNTER — Encounter (HOSPITAL_COMMUNITY): Payer: Self-pay | Admitting: *Deleted

## 2014-09-03 ENCOUNTER — Inpatient Hospital Stay (HOSPITAL_COMMUNITY)
Admission: RE | Admit: 2014-09-03 | Discharge: 2014-09-04 | DRG: 483 | Disposition: A | Discharge status: Home / Self Care | Payer: Medicare Other | Source: Ambulatory Visit | Attending: Orthopedic Surgery | Admitting: Orthopedic Surgery

## 2014-09-03 ENCOUNTER — Encounter (HOSPITAL_COMMUNITY)
Admission: RE | Disposition: A | Discharge status: Home / Self Care | Payer: Self-pay | Source: Ambulatory Visit | Attending: Orthopedic Surgery

## 2014-09-03 ENCOUNTER — Inpatient Hospital Stay (HOSPITAL_COMMUNITY): Payer: Medicare Other | Admitting: Anesthesiology

## 2014-09-03 ENCOUNTER — Inpatient Hospital Stay (HOSPITAL_COMMUNITY): Payer: Medicare Other | Admitting: Vascular Surgery

## 2014-09-03 ENCOUNTER — Inpatient Hospital Stay (HOSPITAL_COMMUNITY): Payer: Medicare Other

## 2014-09-03 DIAGNOSIS — Z91040 Latex allergy status: Secondary | ICD-10-CM | POA: Diagnosis not present

## 2014-09-03 DIAGNOSIS — G8918 Other acute postprocedural pain: Secondary | ICD-10-CM | POA: Diagnosis not present

## 2014-09-03 DIAGNOSIS — Z471 Aftercare following joint replacement surgery: Secondary | ICD-10-CM | POA: Diagnosis not present

## 2014-09-03 DIAGNOSIS — Z87891 Personal history of nicotine dependence: Secondary | ICD-10-CM

## 2014-09-03 DIAGNOSIS — M19012 Primary osteoarthritis, left shoulder: Principal | ICD-10-CM | POA: Diagnosis present

## 2014-09-03 DIAGNOSIS — Z96619 Presence of unspecified artificial shoulder joint: Secondary | ICD-10-CM

## 2014-09-03 DIAGNOSIS — Z8249 Family history of ischemic heart disease and other diseases of the circulatory system: Secondary | ICD-10-CM

## 2014-09-03 DIAGNOSIS — N400 Enlarged prostate without lower urinary tract symptoms: Secondary | ICD-10-CM | POA: Diagnosis present

## 2014-09-03 DIAGNOSIS — F419 Anxiety disorder, unspecified: Secondary | ICD-10-CM | POA: Diagnosis present

## 2014-09-03 DIAGNOSIS — M797 Fibromyalgia: Secondary | ICD-10-CM | POA: Diagnosis not present

## 2014-09-03 DIAGNOSIS — G4730 Sleep apnea, unspecified: Secondary | ICD-10-CM | POA: Diagnosis present

## 2014-09-03 DIAGNOSIS — I10 Essential (primary) hypertension: Secondary | ICD-10-CM | POA: Diagnosis not present

## 2014-09-03 DIAGNOSIS — K219 Gastro-esophageal reflux disease without esophagitis: Secondary | ICD-10-CM | POA: Diagnosis present

## 2014-09-03 DIAGNOSIS — M1990 Unspecified osteoarthritis, unspecified site: Secondary | ICD-10-CM | POA: Diagnosis not present

## 2014-09-03 DIAGNOSIS — Z96612 Presence of left artificial shoulder joint: Secondary | ICD-10-CM

## 2014-09-03 DIAGNOSIS — M25512 Pain in left shoulder: Secondary | ICD-10-CM | POA: Diagnosis not present

## 2014-09-03 HISTORY — PX: TOTAL SHOULDER ARTHROPLASTY: SHX126

## 2014-09-03 SURGERY — ARTHROPLASTY, SHOULDER, TOTAL
Anesthesia: General | Laterality: Left

## 2014-09-03 MED ORDER — CEFAZOLIN SODIUM-DEXTROSE 2-3 GM-% IV SOLR
2.0000 g | Freq: Four times a day (QID) | INTRAVENOUS | Status: AC
  Administered 2014-09-03 – 2014-09-04 (×3): 2 g via INTRAVENOUS
  Filled 2014-09-03 (×3): qty 50

## 2014-09-03 MED ORDER — OXYCODONE HCL 5 MG PO TABS
5.0000 mg | ORAL_TABLET | ORAL | Status: DC | PRN
  Administered 2014-09-03 – 2014-09-04 (×5): 10 mg via ORAL
  Filled 2014-09-03 (×6): qty 2

## 2014-09-03 MED ORDER — SODIUM CHLORIDE 0.9 % IR SOLN
Status: DC | PRN
  Administered 2014-09-03: 3000 mL

## 2014-09-03 MED ORDER — ROCURONIUM BROMIDE 100 MG/10ML IV SOLN
INTRAVENOUS | Status: DC | PRN
  Administered 2014-09-03 (×2): 10 mg via INTRAVENOUS
  Administered 2014-09-03: 40 mg via INTRAVENOUS
  Administered 2014-09-03: 10 mg via INTRAVENOUS

## 2014-09-03 MED ORDER — CITALOPRAM HYDROBROMIDE 10 MG PO TABS
10.0000 mg | ORAL_TABLET | Freq: Every day | ORAL | Status: DC
Start: 2014-09-03 — End: 2014-09-04
  Administered 2014-09-04: 10 mg via ORAL
  Filled 2014-09-03 (×2): qty 1

## 2014-09-03 MED ORDER — SODIUM CHLORIDE 0.9 % IJ SOLN
INTRAMUSCULAR | Status: AC
  Filled 2014-09-03: qty 10

## 2014-09-03 MED ORDER — EPHEDRINE SULFATE 50 MG/ML IJ SOLN
INTRAMUSCULAR | Status: DC | PRN
  Administered 2014-09-03 (×2): 10 mg via INTRAVENOUS
  Administered 2014-09-03: 15 mg via INTRAVENOUS
  Administered 2014-09-03: 10 mg via INTRAVENOUS
  Administered 2014-09-03: 5 mg via INTRAVENOUS

## 2014-09-03 MED ORDER — SODIUM CHLORIDE 0.9 % IV SOLN: INTRAVENOUS | Status: DC

## 2014-09-03 MED ORDER — ARTIFICIAL TEARS OP OINT
TOPICAL_OINTMENT | OPHTHALMIC | Status: DC | PRN
  Administered 2014-09-03: 1 via OPHTHALMIC

## 2014-09-03 MED ORDER — FENTANYL CITRATE 0.05 MG/ML IJ SOLN
INTRAMUSCULAR | Status: DC | PRN
  Administered 2014-09-03 (×2): 100 ug via INTRAVENOUS

## 2014-09-03 MED ORDER — BISACODYL 10 MG RE SUPP: 10.0000 mg | Freq: Every day | RECTAL | Status: DC | PRN

## 2014-09-03 MED ORDER — MIDAZOLAM HCL 2 MG/2ML IJ SOLN
1.0000 mg | INTRAMUSCULAR | Status: DC | PRN
  Administered 2014-09-03: 2 mg via INTRAVENOUS

## 2014-09-03 MED ORDER — MEPERIDINE HCL 25 MG/ML IJ SOLN: 6.2500 mg | INTRAMUSCULAR | Status: DC | PRN

## 2014-09-03 MED ORDER — BUPIVACAINE-EPINEPHRINE (PF) 0.5% -1:200000 IJ SOLN
INTRAMUSCULAR | Status: DC | PRN
  Administered 2014-09-03: 25 mL via PERINEURAL

## 2014-09-03 MED ORDER — ONDANSETRON HCL 4 MG/2ML IJ SOLN
4.0000 mg | Freq: Four times a day (QID) | INTRAMUSCULAR | Status: DC | PRN
  Administered 2014-09-03: 4 mg via INTRAVENOUS
  Filled 2014-09-03: qty 2

## 2014-09-03 MED ORDER — ROCURONIUM BROMIDE 50 MG/5ML IV SOLN
INTRAVENOUS | Status: AC
  Filled 2014-09-03: qty 1

## 2014-09-03 MED ORDER — LABETALOL HCL 100 MG PO TABS
100.0000 mg | ORAL_TABLET | Freq: Every day | ORAL | Status: DC
  Administered 2014-09-04: 100 mg via ORAL
  Filled 2014-09-03 (×2): qty 1

## 2014-09-03 MED ORDER — FENTANYL CITRATE 0.05 MG/ML IJ SOLN
INTRAMUSCULAR | Status: AC
  Administered 2014-09-03: 100 ug via INTRAVENOUS
  Filled 2014-09-03: qty 2

## 2014-09-03 MED ORDER — POLYETHYLENE GLYCOL 3350 17 G PO PACK: 17.0000 g | PACK | Freq: Every day | ORAL | Status: DC | PRN

## 2014-09-03 MED ORDER — ONDANSETRON HCL 4 MG PO TABS: 4.0000 mg | ORAL_TABLET | Freq: Four times a day (QID) | ORAL | Status: DC | PRN

## 2014-09-03 MED ORDER — DIPHENHYDRAMINE HCL 12.5 MG/5ML PO ELIX: 12.5000 mg | ORAL_SOLUTION | ORAL | Status: DC | PRN

## 2014-09-03 MED ORDER — ALUMINUM HYDROXIDE GEL 320 MG/5ML PO SUSP
15.0000 mL | ORAL | Status: DC | PRN
  Filled 2014-09-03: qty 30

## 2014-09-03 MED ORDER — PROPOFOL 10 MG/ML IV BOLUS
INTRAVENOUS | Status: DC | PRN
  Administered 2014-09-03: 175 mg via INTRAVENOUS

## 2014-09-03 MED ORDER — FENTANYL CITRATE 0.05 MG/ML IJ SOLN
INTRAMUSCULAR | Status: AC
  Filled 2014-09-03: qty 2

## 2014-09-03 MED ORDER — MIDAZOLAM HCL 2 MG/2ML IJ SOLN
INTRAMUSCULAR | Status: AC
  Filled 2014-09-03: qty 2

## 2014-09-03 MED ORDER — BUPIVACAINE LIPOSOME 1.3 % IJ SUSP
INTRAMUSCULAR | Status: DC | PRN
  Administered 2014-09-03: 20 mL

## 2014-09-03 MED ORDER — MIDAZOLAM HCL 5 MG/5ML IJ SOLN
INTRAMUSCULAR | Status: DC | PRN
  Administered 2014-09-03: 2 mg via INTRAVENOUS

## 2014-09-03 MED ORDER — FENTANYL CITRATE 0.05 MG/ML IJ SOLN
INTRAMUSCULAR | Status: AC
  Filled 2014-09-03: qty 5

## 2014-09-03 MED ORDER — OXYCODONE-ACETAMINOPHEN 5-325 MG PO TABS
1.0000 | ORAL_TABLET | ORAL | Status: DC | PRN
  Administered 2014-09-03 – 2014-09-04 (×2): 1 via ORAL
  Filled 2014-09-03: qty 2
  Filled 2014-09-03: qty 1

## 2014-09-03 MED ORDER — FLEET ENEMA 7-19 GM/118ML RE ENEM: 1.0000 | ENEMA | Freq: Once | RECTAL | Status: AC | PRN

## 2014-09-03 MED ORDER — TAMSULOSIN HCL 0.4 MG PO CAPS
0.4000 mg | ORAL_CAPSULE | Freq: Every day | ORAL | Status: DC
  Administered 2014-09-03 – 2014-09-04 (×2): 0.4 mg via ORAL
  Filled 2014-09-03 (×2): qty 1

## 2014-09-03 MED ORDER — NEOSTIGMINE METHYLSULFATE 10 MG/10ML IV SOLN
INTRAVENOUS | Status: DC | PRN
  Administered 2014-09-03: 4 mg via INTRAVENOUS

## 2014-09-03 MED ORDER — FENTANYL CITRATE 0.05 MG/ML IJ SOLN
50.0000 ug | INTRAMUSCULAR | Status: DC | PRN
  Administered 2014-09-03: 100 ug via INTRAVENOUS

## 2014-09-03 MED ORDER — METOCLOPRAMIDE HCL 10 MG PO TABS: 5.0000 mg | ORAL_TABLET | Freq: Three times a day (TID) | ORAL | Status: DC | PRN

## 2014-09-03 MED ORDER — ACETAMINOPHEN 650 MG RE SUPP: 650.0000 mg | Freq: Four times a day (QID) | RECTAL | Status: DC | PRN

## 2014-09-03 MED ORDER — FENTANYL CITRATE 0.05 MG/ML IJ SOLN
25.0000 ug | INTRAMUSCULAR | Status: DC | PRN
  Administered 2014-09-03 (×2): 50 ug via INTRAVENOUS

## 2014-09-03 MED ORDER — AMLODIPINE BESYLATE 10 MG PO TABS
10.0000 mg | ORAL_TABLET | Freq: Every day | ORAL | Status: DC
  Administered 2014-09-04: 10 mg via ORAL
  Filled 2014-09-03 (×2): qty 1

## 2014-09-03 MED ORDER — EPHEDRINE SULFATE 50 MG/ML IJ SOLN
INTRAMUSCULAR | Status: AC
  Filled 2014-09-03: qty 1

## 2014-09-03 MED ORDER — ASPIRIN EC 325 MG PO TBEC
325.0000 mg | DELAYED_RELEASE_TABLET | Freq: Two times a day (BID) | ORAL | Status: DC
  Administered 2014-09-03 – 2014-09-04 (×2): 325 mg via ORAL
  Filled 2014-09-03 (×5): qty 1

## 2014-09-03 MED ORDER — MORPHINE SULFATE 2 MG/ML IJ SOLN: 1.0000 mg | INTRAMUSCULAR | Status: DC | PRN

## 2014-09-03 MED ORDER — DEXTROSE 5 % IV SOLN
10.0000 mg | INTRAVENOUS | Status: DC | PRN
  Administered 2014-09-03: 20 ug/min via INTRAVENOUS

## 2014-09-03 MED ORDER — PHENOL 1.4 % MT LIQD: 1.0000 | OROMUCOSAL | Status: DC | PRN

## 2014-09-03 MED ORDER — TRAMADOL HCL 50 MG PO TABS
50.0000 mg | ORAL_TABLET | Freq: Three times a day (TID) | ORAL | Status: DC
  Administered 2014-09-03 – 2014-09-04 (×3): 50 mg via ORAL
  Filled 2014-09-03 (×3): qty 1

## 2014-09-03 MED ORDER — LIDOCAINE HCL (CARDIAC) 20 MG/ML IV SOLN
INTRAVENOUS | Status: AC
  Filled 2014-09-03: qty 5

## 2014-09-03 MED ORDER — LOSARTAN POTASSIUM 50 MG PO TABS
100.0000 mg | ORAL_TABLET | Freq: Every day | ORAL | Status: DC
  Administered 2014-09-03 – 2014-09-04 (×2): 100 mg via ORAL
  Filled 2014-09-03 (×3): qty 2

## 2014-09-03 MED ORDER — METOCLOPRAMIDE HCL 5 MG/ML IJ SOLN: 5.0000 mg | Freq: Three times a day (TID) | INTRAMUSCULAR | Status: DC | PRN

## 2014-09-03 MED ORDER — POVIDONE-IODINE 7.5 % EX SOLN
Freq: Once | CUTANEOUS | Status: DC
  Filled 2014-09-03: qty 118

## 2014-09-03 MED ORDER — GLYCOPYRROLATE 0.2 MG/ML IJ SOLN
INTRAMUSCULAR | Status: DC | PRN
  Administered 2014-09-03: 0.2 mg via INTRAVENOUS
  Administered 2014-09-03: .8 mg via INTRAVENOUS
  Administered 2014-09-03: 0.2 mg via INTRAVENOUS

## 2014-09-03 MED ORDER — LACTATED RINGERS IV SOLN
INTRAVENOUS | Status: DC
  Administered 2014-09-03 (×4): via INTRAVENOUS

## 2014-09-03 MED ORDER — ONDANSETRON HCL 4 MG/2ML IJ SOLN
INTRAMUSCULAR | Status: DC | PRN
  Administered 2014-09-03: 4 mg via INTRAVENOUS

## 2014-09-03 MED ORDER — PROPOFOL 10 MG/ML IV BOLUS
INTRAVENOUS | Status: AC
  Filled 2014-09-03: qty 20

## 2014-09-03 MED ORDER — MENTHOL 3 MG MT LOZG: 1.0000 | LOZENGE | OROMUCOSAL | Status: DC | PRN

## 2014-09-03 MED ORDER — PROMETHAZINE HCL 25 MG/ML IJ SOLN: 6.2500 mg | INTRAMUSCULAR | Status: DC | PRN

## 2014-09-03 MED ORDER — ACETAMINOPHEN 325 MG PO TABS: 650.0000 mg | ORAL_TABLET | Freq: Four times a day (QID) | ORAL | Status: DC | PRN

## 2014-09-03 MED ORDER — MIDAZOLAM HCL 2 MG/2ML IJ SOLN
INTRAMUSCULAR | Status: AC
  Administered 2014-09-03: 2 mg via INTRAVENOUS
  Filled 2014-09-03: qty 2

## 2014-09-03 MED ORDER — DOCUSATE SODIUM 100 MG PO CAPS
100.0000 mg | ORAL_CAPSULE | Freq: Two times a day (BID) | ORAL | Status: DC
  Administered 2014-09-03 – 2014-09-04 (×2): 100 mg via ORAL
  Filled 2014-09-03 (×3): qty 1

## 2014-09-03 MED ORDER — ONDANSETRON HCL 4 MG/2ML IJ SOLN
INTRAMUSCULAR | Status: AC
  Filled 2014-09-03: qty 2

## 2014-09-03 MED ORDER — SODIUM CHLORIDE 0.9 % IJ SOLN
INTRAMUSCULAR | Status: DC | PRN
  Administered 2014-09-03: 20 mL via INTRAVENOUS

## 2014-09-03 MED ORDER — LIDOCAINE HCL (CARDIAC) 20 MG/ML IV SOLN
INTRAVENOUS | Status: DC | PRN
  Administered 2014-09-03: 80 mg via INTRAVENOUS

## 2014-09-03 MED ORDER — BUPIVACAINE LIPOSOME 1.3 % IJ SUSP
20.0000 mL | INTRAMUSCULAR | Status: DC
  Filled 2014-09-03: qty 20

## 2014-09-03 SURGICAL SUPPLY — 67 items
Aequalis Ascend humeral Stem (Shoulder) ×3 IMPLANT
Affiniti pegged glenoid (Shoulder) ×3 IMPLANT
BIT DRILL 5/64X5 DISP (BIT) ×3 IMPLANT
BLADE SAW SAG 73X25 THK (BLADE) ×2
BLADE SAW SGTL 73X25 THK (BLADE) ×1 IMPLANT
BLADE SURG 15 STRL LF DISP TIS (BLADE) ×1 IMPLANT
BLADE SURG 15 STRL SS (BLADE) ×2
CEMENT BONE DEPUY (Cement) ×3 IMPLANT
CHLORAPREP W/TINT 26ML (MISCELLANEOUS) ×3 IMPLANT
CLOSURE WOUND 1/2 X4 (GAUZE/BANDAGES/DRESSINGS) ×1
COVER MAYO STAND STRL (DRAPES) ×3 IMPLANT
COVER SURGICAL LIGHT HANDLE (MISCELLANEOUS) ×3 IMPLANT
DRAPE IMP U-DRAPE 54X76 (DRAPES) ×3 IMPLANT
DRAPE INCISE IOBAN 66X45 STRL (DRAPES) ×6 IMPLANT
DRAPE ORTHO SPLIT 77X108 STRL (DRAPES) ×4
DRAPE SURG 17X23 STRL (DRAPES) ×3 IMPLANT
DRAPE SURG ORHT 6 SPLT 77X108 (DRAPES) ×2 IMPLANT
DRAPE U-SHAPE 47X51 STRL (DRAPES) ×3 IMPLANT
DRSG AQUACEL AG ADV 3.5X10 (GAUZE/BANDAGES/DRESSINGS) ×3 IMPLANT
ELECT BLADE 4.0 EZ CLEAN MEGAD (MISCELLANEOUS)
ELECT REM PT RETURN 9FT ADLT (ELECTROSURGICAL) ×3
ELECTRODE BLDE 4.0 EZ CLN MEGD (MISCELLANEOUS) IMPLANT
ELECTRODE REM PT RTRN 9FT ADLT (ELECTROSURGICAL) ×1 IMPLANT
Flex Aequalis humeral head (Shoulder) ×3 IMPLANT
GLOVE BIO SURGEON STRL SZ7 (GLOVE) ×6 IMPLANT
GLOVE BIOGEL PI IND STRL 7.0 (GLOVE) ×2 IMPLANT
GLOVE BIOGEL PI INDICATOR 7.0 (GLOVE) ×4
GLOVE SURG SS PI 7.0 STRL IVOR (GLOVE) ×6 IMPLANT
GLOVE SURG SS PI 7.5 STRL IVOR (GLOVE) ×6 IMPLANT
GOWN STRL REUS W/ TWL LRG LVL3 (GOWN DISPOSABLE) ×1 IMPLANT
GOWN STRL REUS W/ TWL XL LVL3 (GOWN DISPOSABLE) ×1 IMPLANT
GOWN STRL REUS W/TWL LRG LVL3 (GOWN DISPOSABLE) ×2
GOWN STRL REUS W/TWL XL LVL3 (GOWN DISPOSABLE) ×2
HANDPIECE INTERPULSE COAX TIP (DISPOSABLE) ×2
HEMOSTAT SURGICEL 2X14 (HEMOSTASIS) ×3 IMPLANT
HOOD PEEL AWAY FACE SHEILD DIS (HOOD) ×6 IMPLANT
KIT BASIN OR (CUSTOM PROCEDURE TRAY) ×3 IMPLANT
KIT ROOM TURNOVER OR (KITS) ×3 IMPLANT
MANIFOLD NEPTUNE II (INSTRUMENTS) ×3 IMPLANT
NEEDLE HYPO 25GX1X1/2 BEV (NEEDLE) IMPLANT
NEEDLE MAYO TROCAR (NEEDLE) ×3 IMPLANT
NS IRRIG 1000ML POUR BTL (IV SOLUTION) ×3 IMPLANT
PACK SHOULDER (CUSTOM PROCEDURE TRAY) ×3 IMPLANT
PAD ARMBOARD 7.5X6 YLW CONV (MISCELLANEOUS) ×6 IMPLANT
PIN GUIDE 2.5X200 (PIN) ×3 IMPLANT
RETRIEVER SUT HEWSON (MISCELLANEOUS) ×3 IMPLANT
SET HNDPC FAN SPRY TIP SCT (DISPOSABLE) ×1 IMPLANT
SLING ARM IMMOBILIZER LRG (SOFTGOODS) ×3 IMPLANT
SLING ARM IMMOBILIZER MED (SOFTGOODS) IMPLANT
SMARTMIX MINI TOWER (MISCELLANEOUS) ×3
SPONGE LAP 18X18 X RAY DECT (DISPOSABLE) ×3 IMPLANT
SPONGE LAP 4X18 X RAY DECT (DISPOSABLE) IMPLANT
STRIP CLOSURE SKIN 1/2X4 (GAUZE/BANDAGES/DRESSINGS) ×2 IMPLANT
SUCTION FRAZIER TIP 10 FR DISP (SUCTIONS) ×3 IMPLANT
SUPPORT WRAP ARM LG (MISCELLANEOUS) ×3 IMPLANT
SUT ETHIBOND NAB CT1 #1 30IN (SUTURE) ×3 IMPLANT
SUT MNCRL AB 4-0 PS2 18 (SUTURE) ×3 IMPLANT
SUT SILK 2 0 TIES 17X18 (SUTURE)
SUT SILK 2-0 18XBRD TIE BLK (SUTURE) IMPLANT
SUT VIC AB 0 CTB1 27 (SUTURE) IMPLANT
SUT VIC AB 2-0 CT1 27 (SUTURE) ×2
SUT VIC AB 2-0 CT1 TAPERPNT 27 (SUTURE) ×1 IMPLANT
SYR CONTROL 10ML LL (SYRINGE) IMPLANT
TAPE FIBER 2MM 7IN #2 BLUE (SUTURE) ×9 IMPLANT
TOWEL OR 17X24 6PK STRL BLUE (TOWEL DISPOSABLE) ×3 IMPLANT
TOWEL OR 17X26 10 PK STRL BLUE (TOWEL DISPOSABLE) ×3 IMPLANT
TOWER SMARTMIX MINI (MISCELLANEOUS) ×1 IMPLANT

## 2014-09-03 NOTE — Anesthesia Postprocedure Evaluation (Signed)
  Anesthesia Post-op Note  Patient: [facility] Joe Patton.  Procedure(s) Performed: Procedure(s): TOTAL SHOULDER ARTHROPLASTY (Left)  Patient Location: PACU  Anesthesia Type:GA combined with regional for post-op pain  Level of Consciousness: awake, alert , oriented and patient cooperative  Airway and Oxygen Therapy: Patient Spontanous Breathing  Post-op Pain: none  Post-op Assessment: Post-op Vital signs reviewed, Patient's Cardiovascular Status Stable, Respiratory Function Stable, Patent Airway, No signs of Nausea or vomiting and Pain level controlled  Post-op Vital Signs: stable  Last Vitals:  Filed Vitals:   09/03/14 1420  BP: 169/91  Pulse: 73  Temp: 37 C  Resp: 42    Complications: No apparent anesthesia complications

## 2014-09-03 NOTE — Progress Notes (Signed)
Left shoulder surgery

## 2014-09-03 NOTE — Op Note (Signed)
Procedure(s): TOTAL SHOULDER ARTHROPLASTY Procedure Note  Jule P Vallen Calabrese. male 68 y.o. 09/03/2014  Procedure(s) and Anesthesia Type:    * LEFT TOTAL SHOULDER ARTHROPLASTY - General  Surgeon(s) and Role:    * Nita Sells, MD - Primary   Indications:  68 y.o. male  With endstage left shoulder arthritis. Pain and dysfunction interfered with quality of life and nonoperative treatment with activity modification, NSAIDS and injections failed.     Surgeon: Nita Sells   Assistants: Jeanmarie Hubert PA-C [facility] was present and scrubbed throughout the procedure and was essential in positioning, retraction, exposure, and closure)  Anesthesia: General endotracheal anesthesia with preoperative interscalene block    Procedure Detail  TOTAL SHOULDER ARTHROPLASTY  Findings: Tornier Flex total shoulder with cortilock glenoid with a size 7 porous-coated press-fit stem, 48 low eccentric head, 52 glenoid, cemented. A lesser tuberosity osteotomy was performed prepared at the conclusion of the procedure.  Estimated Blood Loss:  300 mL         Drains: 1 medium hemovac  Blood Given: none          Specimens: none        Complications:  * No complications entered in OR log *         Disposition: PACU - hemodynamically stable.         Condition: stable    Procedure:   The patient was identified in the preoperative holding area where I personally marked the operative extremity after verifying with the patient and consent. He  was taken to the operating room where He was transferred to the   operative table.  The patient received an interscalene block in   the holding area by the attending anesthesiologist.  General anesthesia was induced   in the operating room without complication.  The patient did receive IV  Ancef prior to the commencement of the procedure.  The patient was   placed in the beach-chair position with the back raised about 30   degrees.   The nonoperative extremity and head and neck were carefully   positioned and padded protecting against neurovascular compromise.  The   left upper extremity was then prepped and draped in the standard sterile   fashion.    The appropriate operative time-out was performed with   Anesthesia, the perioperative staff, as well as myself and we all agreed   that the left side was the correct operative site.  An approximately   10 cm incision was made from the tip of the coracoid to the center point of the   humerus at the level of the axilla.  Dissection was carried down sharply   through subcutaneous tissues and cephalic vein was identified and taken   laterally with the deltoid.  The pectoralis major was taken medially.  The   upper 1 cm of the pectoralis major was released from its attachment on   the humerus.  The clavipectoral fascia was incised just lateral to the   conjoined tendon.  This incision was carried up to but not into the   coracoacromial ligament.  Digital palpation was used to prove   integrity of the axillary nerve which was protected throughout the   procedure.  Musculocutaneous nerve was not palpated in the operative   field.  Conjoined tendon was then retracted gently medially and the   deltoid laterally.  Anterior circumflex humeral vessels were clamped and   coagulated.  The soft tissues overlying the biceps was incised and  this   incision was carried across the transverse humeral ligament to the base   of the coracoid.  The biceps was tenodesed to the soft tissue just above   pectoralis major and the remaining portion of the biceps superiorly was   excised.  An osteotomy was performed at the lesser tuberosity and the   subscapularis was freed from the underlying capsule.  Capsule was then   released all the way down to the 6 o'clock position of the humeral head.   The humeral head was then delivered with simultaneous adduction,   extension and external rotation.  All  humeral osteophytes were removed   and the anatomic neck of the humerus was marked and cut free hand at   approximately 25 degrees retroversion within about 3 mm of the cuff   reflection posteriorly.  The head size was estimated to be a 48 medium   offset.  At that point, the humeral head was retracted posteriorly with   a Fukuda retractor and the anterior-inferior capsule was excised.   Remaining portion of the capsule was released at the base of the   coracoid.  The remaining biceps anchor and the entire anterior-inferior   labrum was excised.  The posterior labrum was also excised but the   posterior capsule was not released.  Glenoid exposure was difficult given the amount of medialization. The guidepin was placed bicortically after marking the central area with a guide.  The reamer was used to ream to concentric bone with punctate bleeding.  This gave an excellent concentric surface.  The center hole was then drilled for an anchor peg glenoid followed by the three peripheral holes and none of the holes   exited the glenoid wall.  I then pulse irrigated these holes and dried   them with Surgicel.  The three peripheral holes were then   pressurized cemented and the anchor peg glenoid was placed and impacted   with an excellent fit.  The glenoid was a 52 component.  The proximal humerus was then again exposed taking care not to displace the glenoid.    The entry all was used followed by sounding broaches up to size 7/8. Broaches were then used up to size 7 which was felt to have appropriate press-fit and distal cortical contact. The trial was then placed with a 48 eccentric head. With the trial implantation of the component, there was   approximately 50% posterior translation with immediate snap back to the   anatomic position.  With forward elevation, there was no tendency   towards posterior subluxation.   The trial was removed and the final implant was prepared on a back table.  The trial was  removed and the final implant was prepared on a back table.   Small holes were drilled on both sides of the lesser tuberosity osteotomy, through which 3 Fibertapes were passed. The implant was then placed through the loop of all 3 Fibertapes and impacted with an excellent press-fit. This achieved excellent anatomic reconstruction of the proximal humerus.  The joint was then copiously irrigated with pulse lavage.  The subscapularis and   lesser tuberosity osteotomy were then repaired using the 3 Fibertapes previously passed.   One #1 Ethibond was placed at the rotator interval just above   the lesser tuberosity. Copious irrigation was used.  Experel was used to infiltrate the subcutaneous tissues and the deep joint with a mixture of 20 cc Experel and 20 cc normal saline. Skin was closed  with 2-0 Vicryl sutures in the deep dermal layer and 4-0 Monocryl in a subcuticular  running fashion.  Sterile dressings were then applied including Aquacel.  The patient was placed in a sling and allowed to awaken from general anesthesia and taken to the recovery room in stable condition.      POSTOPERATIVE PLAN:  Early passive range of motion will be allowed with the goal of 20 degrees external rotation and 120 degrees forward elevation.  No internal rotation at this time.  No active motion of the arm until the lesser tuberosity heals.  The patient will likely be kept in the hospital for 1-2 days and then discharged home.

## 2014-09-03 NOTE — Progress Notes (Signed)
Patient's Systolic BP running [phone]. Refused scheduled BP meds because he took them this morning and states he takes them once daily.

## 2014-09-03 NOTE — Progress Notes (Signed)
Wiggles fingers left hand

## 2014-09-03 NOTE — Progress Notes (Signed)
Placed patient on CPAP at 8cm for the night

## 2014-09-03 NOTE — Anesthesia Procedure Notes (Signed)
Anesthesia Regional Block:  Supraclavicular block  Pre-Anesthetic Checklist: ,, timeout performed, Correct Patient, Correct Site, Correct Laterality, Correct Procedure, Correct Position, site marked, Risks and benefits discussed,  Surgical consent,  Pre-op evaluation,  At surgeon's request and post-op pain management  Laterality: Left  Prep: chloraprep       Needles:  Injection technique: Single-shot  Needle Type: Echogenic Stimulator Needle     Needle Length: 10cm 10 cm Needle Gauge: 22 and 22 G    Additional Needles:  Procedures: ultrasound guided (picture in chart) Supraclavicular block  Nerve Stimulator or Paresthesia:  Response: 0.5 mA,   Additional Responses:   Narrative:  Start time: 09/03/2014 9:45 AM End time: 09/03/2014 9:58 AM Injection made incrementally with aspirations every 5 mL. Anesthesiologist: Alexis Frock  Additional Notes: L supra clavicular block, [place] and stimulator, 25cc .5% marcaine with epi, multiple asp, talked throughout procedure to patient, no complications

## 2014-09-03 NOTE — Progress Notes (Signed)
Uses urinal

## 2014-09-03 NOTE — Progress Notes (Addendum)
Uses urinal, no foley catheter

## 2014-09-03 NOTE — Progress Notes (Signed)
Utilization review completed.  

## 2014-09-03 NOTE — Progress Notes (Signed)
[place] on Thanksgiving Day. Bruised left rib and scraped knees.

## 2014-09-03 NOTE — OR Nursing (Signed)
Latex Precautions observed.

## 2014-09-03 NOTE — Progress Notes (Signed)
Slightly hpoactive in lower quadrants

## 2014-09-03 NOTE — Progress Notes (Signed)
Pages sent to Tylene Fantasia at 19:20 regarding patient's systolic BP running [phone].

## 2014-09-03 NOTE — Transfer of Care (Signed)
Immediate Anesthesia Transfer of Care Note  Patient: [name].  Procedure(s) Performed: Procedure(s): TOTAL SHOULDER ARTHROPLASTY (Left)  Patient Location: PACU  Anesthesia Type:General  Level of Consciousness: awake, alert  and oriented  Airway & Oxygen Therapy: Patient Spontanous Breathing and Patient connected to nasal cannula oxygen  Post-op Assessment: Report given to PACU RN and Post -op Vital signs reviewed and stable  Post vital signs: Reviewed and stable  Complications: No apparent anesthesia complications

## 2014-09-04 ENCOUNTER — Encounter (HOSPITAL_COMMUNITY): Payer: Self-pay | Admitting: Orthopedic Surgery

## 2014-09-04 ENCOUNTER — Telehealth: Payer: Self-pay | Admitting: Pulmonary Disease

## 2014-09-04 LAB — CBC
HCT: 34.3 % — ABNORMAL LOW (ref 39.0–52.0)
HEMOGLOBIN: 11.6 g/dL — AB (ref 13.0–17.0)
MCH: 29.5 pg (ref 26.0–34.0)
MCHC: 33.8 g/dL (ref 30.0–36.0)
MCV: 87.3 fL (ref 78.0–100.0)
Platelets: 212 10*3/uL (ref 150–400)
RBC: 3.93 MIL/uL — ABNORMAL LOW (ref 4.22–5.81)
RDW: 13.5 % (ref 11.5–15.5)
WBC: 8.1 10*3/uL (ref 4.0–10.5)

## 2014-09-04 LAB — BASIC METABOLIC PANEL
Anion gap: 10 (ref 5–15)
BUN: 7 mg/dL (ref 6–23)
CHLORIDE: 102 meq/L (ref 96–112)
CO2: 29 meq/L (ref 19–32)
CREATININE: 0.84 mg/dL (ref 0.50–1.35)
Calcium: 8.6 mg/dL (ref 8.4–10.5)
GFR calc non Af Amer: 89 mL/min — ABNORMAL LOW (ref >90)
Glucose, Bld: 110 mg/dL — ABNORMAL HIGH (ref 70–99)
POTASSIUM: 3.1 meq/L — AB (ref 3.7–5.3)
SODIUM: 141 meq/L (ref 137–147)

## 2014-09-04 MED ORDER — POTASSIUM CHLORIDE 20 MEQ PO PACK: 40.0000 meq | PACK | Freq: Once | ORAL | Status: DC

## 2014-09-04 MED ORDER — OXYCODONE-ACETAMINOPHEN 5-325 MG PO TABS: 1.0000 | ORAL_TABLET | ORAL | Status: DC | PRN

## 2014-09-04 MED ORDER — POTASSIUM CHLORIDE CRYS ER 20 MEQ PO TBCR
40.0000 meq | EXTENDED_RELEASE_TABLET | Freq: Once | ORAL | Status: AC
  Administered 2014-09-04: 40 meq via ORAL

## 2014-09-04 MED ORDER — DOCUSATE SODIUM 100 MG PO CAPS: 100.0000 mg | ORAL_CAPSULE | Freq: Three times a day (TID) | ORAL | Status: DC | PRN

## 2014-09-04 NOTE — Progress Notes (Signed)
Occupational Therapy Evaluation Patient Details Name: [facility] Joe Patton. MRN: [id number] DOB: April 16, 1946 Today's Date: 09/04/2014    History of Present Illness L TSA   Clinical Impression   All education completed regarding HEP and compensatory techniques for ADL following Dr. Bettina Gavia TSA protocol. Increased time spent with wife reviewing ADL techniques, positioning, donning/doffing sling, amd HEP. Sling waist strap adjusted to fit pt. Pt to continue with his HEP until his follow up with Dr. Tamera Punt who will progress his activity at that time. Pt ready for D/C when medically stable.    Follow Up Recommendations    Follow up with Dr. Tamera Punt who will progress therapy   Equipment Recommendations  None recommended by OT    Recommendations for Other Services       Precautions / Restrictions Precautions Precautions: Shoulder;Other (comment) (chandler protocol) Type of Shoulder Precautions: FF to 120 ER to 20 with dowel Shoulder Interventions: Shoulder sling/immobilizer;At all times;Off for dressing/bathing/exercises Precaution Booklet Issued: Yes (comment) Restrictions Weight Bearing Restrictions: Yes LUE Weight Bearing: Non weight bearing      Mobility Bed Mobility                  Transfers Overall transfer level: Modified independent                    Balance Overall balance assessment: No apparent balance deficits (not formally assessed)                                          ADL Overall ADL's : Needs assistance/impaired                                     Functional mobility during ADLs: Modified independent (due to pain meds) General ADL Comments: completed education on compensatory techniques for ADL with pt/wife. Adapted waist strap to fit pt. pt/wife able to return demosntrate learned techniques     Vision                     Perception     Praxis      Pertinent Vitals/Pain Pain  Assessment: 0-10 Pain Score: 6  Pain Location: L shoulder Pain Descriptors / Indicators: Aching;Constant Pain Intervention(s): Limited activity within patient's tolerance;Monitored during session;Repositioned;Ice applied     Hand Dominance Right   Extremity/Trunk Assessment Upper Extremity Assessment Upper Extremity Assessment: LUE deficits/detail LUE Deficits / Details: mod edema. able to achieve @ 45 degrees FF aarom; 10 ER AAROM with cane LUE Coordination: decreased gross motor;decreased fine motor   Lower Extremity Assessment Lower Extremity Assessment: Overall WFL for tasks assessed (hx of B knee pain)   Cervical / Trunk Assessment Cervical / Trunk Assessment: Normal   Communication Communication Communication: No difficulties   Cognition Arousal/Alertness: Awake/alert Behavior During Therapy: WFL for tasks assessed/performed Overall Cognitive Status: Within Functional Limits for tasks assessed                     General Comments       Exercises Exercises: Shoulder- see doc flow; information not populated     Shoulder Instructions Shoulder Instructions Donning/doffing shirt without moving shoulder: Patient able to independently direct caregiver;Caregiver independent with task Method for sponge bathing under operated UE: Caregiver independent with task;Patient able to independently direct  caregiver Donning/doffing sling/immobilizer: Caregiver independent with task;Patient able to independently direct caregiver Correct positioning of sling/immobilizer: Independent ROM for elbow, wrist and digits of operated UE: Independent Sling wearing schedule (on at all times/off for ADL's): Caregiver independent with task;Patient able to independently direct caregiver Proper positioning of operated UE when showering: Caregiver independent with task;Patient able to independently direct caregiver Positioning of UE while sleeping: [place]  expects to be discharged to:: Private residence Living Arrangements: Spouse/significant other Available Help at Discharge: Family;Available 24 hours/day Type of Home: House Home Access: Stairs to enter [employer] of Steps: 2   Home Layout: One level     Bathroom Shower/Tub: Occupational psychologist: Handicapped height     Home Equipment: [place] - single point          Prior Functioning/Environment Level of Independence: Independent             OT Diagnosis: Generalized weakness;Acute pain   OT Problem List: Decreased strength;Decreased range of motion;Decreased activity tolerance;Decreased knowledge of precautions;Impaired UE functional use;Pain   OT Treatment/Interventions:      OT Goals(Current goals can be found in the care plan section) Acute Rehab OT Goals Patient Stated Goal: to use my arm again OT Goal Formulation:  (eval only)  OT Frequency:     Barriers to D/C:            Co-evaluation              End of Session Equipment Utilized During Treatment: Other (comment) (sling) Nurse Communication: Mobility status;Other (comment) (ready for D/C)  Activity Tolerance: Patient tolerated treatment well Patient left: in chair;with call bell/phone within reach;with family/visitor present   Time: 0277-4128 OT Time Calculation (min): 61 min Charges:  OT General Charges $OT Visit: 1 Procedure OT Evaluation $Initial OT Evaluation Tier I: 1 Procedure OT Treatments $Self Care/Home Management : 23-37 mins $Therapeutic Activity: 23-37 mins G-Codes:    Jamiyah Dingley,HILLARY Sep 22, 2014, 11:08 AM   Maurie Boettcher, OTR/L  [phone] 09/22/14

## 2014-09-04 NOTE — Progress Notes (Signed)
Left total shoulder

## 2014-09-04 NOTE — Telephone Encounter (Signed)
Good usage - Average 12cm

## 2014-09-04 NOTE — Discharge Instructions (Signed)

## 2014-09-04 NOTE — Progress Notes (Signed)
   PATIENT ID: Joe Patton Code.   1 Day Post-Op Procedure(s) (LRB): TOTAL SHOULDER ARTHROPLASTY (Left)  Subjective: Some discomfort left shoulder, tolerating pain with oral medication well. No other complaints or concerns.   Objective:  Filed Vitals:   09/04/14 0618  BP: 132/71  Pulse: 79  Temp: 98.4 F (36.9 C)  Resp: 16     L UE dressing c/d/i Wiggles fingers, not convincingly firing deltoid on testing, distally NVI  Labs:   Recent Labs  09/04/14 0546  HGB 11.6*   Recent Labs  09/04/14 0546  WBC 8.1  RBC 3.93*  HCT 34.3*  PLT 212   Recent Labs  09/04/14 0546  NA 141  K 3.1*  CL 102  CO2 29  BUN 7  CREATININE 0.84  GLUCOSE 110*  CALCIUM 8.6    Assessment and Plan: 1 day s/p left TSA OT today limited to 120 FF 20 ER Potassium low at 3.1, will give one time dose 40 meq KCL D/c home today if pain controlled and cleared by OT Follow up in 2 weeks with Dr. Tamera Punt  VTE proph: ASA 325mg  BID, SCDs

## 2014-09-04 NOTE — Telephone Encounter (Signed)
Patient notified.  Nothing further needed at this time.

## 2014-09-07 NOTE — Discharge Summary (Signed)
Patient ID: [facility] [name]. MRN: [id number] DOB/AGE: Aug 29, 1946 68 y.o.  Admit date: 09/03/2014 Discharge date: 09/07/2014  Admission Diagnoses:  Active Problems:   Status post total shoulder arthroplasty   Discharge Diagnoses:  Same  Past Medical History  Diagnosis Date  . Viral warts   . Hypertension   . Arthritis   . Actinic keratosis   . Osteoarthrosis   . Erectile dysfunction   . Degeneration, intervertebral disc, lumbosacral   . Bilirubinuria   . Sinusitis   . Allergic rhinitis   . Eustachian tube dysfunction   . BPH (benign prostatic hyperplasia)   . Sleep apnea     uses CPAP, will bring mask  . Anxiety   . GERD (gastroesophageal reflux disease)     OTC if needed Pepcid  . Fibromyalgia     Surgeries: Procedure(s): TOTAL SHOULDER ARTHROPLASTY on 09/03/2014   Consultants:    Discharged Condition: Improved  Hospital Course: [facility] Joe Pat[facility]. is an 68 y.o. male who was admitted 09/03/2014 for operative treatment of left shoulder osteoarthritis. Patient has severe unremitting pain that affects sleep, daily activities, and work/hobbies. After pre-op clearance the patient was taken to the operating room on 09/03/2014 and underwent  Procedure(s): TOTAL SHOULDER ARTHROPLASTY.    Patient was given perioperative antibiotics:  Anti-infectives    Start     Dose/Rate Route Frequency Ordered Stop   09/03/14 1645  ceFAZolin (ANCEF) IVPB 2 g/50 mL premix     2 g100 mL/hr over 30 Minutes Intravenous Every 6 hours 09/03/14 1631 09/04/14 0623   09/03/14 0600  ceFAZolin (ANCEF) IVPB 2 g/50 mL premix     2 g100 mL/hr over 30 Minutes Intravenous On call to O.R. 09/02/14 1547 09/03/14 1155       Patient was given sequential compression devices, early ambulation, and ASA 325mg  BID to prevent DVT.  Patient benefited maximally from hospital stay and there were no complications.    Recent vital signs: No data found.    Recent laboratory studies: No results for input(s):  WBC, HGB, HCT, PLT, NA, K, CL, CO2, BUN, CREATININE, GLUCOSE, INR, CALCIUM in the last 72 hours.  Invalid input(s): PT, 2   Discharge Medications:     Medication List    TAKE these medications        amLODipine 5 MG tablet  Commonly known as:  NORVASC  Take 10 mg by mouth daily.     citalopram 20 MG tablet  Commonly known as:  CELEXA  Take 10 mg by mouth daily.     docusate sodium 100 MG capsule  Commonly known as:  COLACE  Take 1 capsule (100 mg total) by mouth 3 (three) times daily as needed.     labetalol 100 MG tablet  Commonly known as:  NORMODYNE  Take 100 mg by mouth daily.     losartan 100 MG tablet  Commonly known as:  COZAAR  Take 100 mg by mouth daily.     oxyCODONE-acetaminophen 5-325 MG per tablet  Commonly known as:  ROXICET  Take 1-2 tablets by mouth every 4 (four) hours as needed for severe pain.     tamsulosin 0.4 MG Caps capsule  Commonly known as:  FLOMAX  Take 0.4 mg by mouth daily.     traMADol 50 MG tablet  Commonly known as:  ULTRAM  Take 50 mg by mouth 3 (three) times daily.        Diagnostic Studies: Dg Chest 2 View  08/25/2014  CLINICAL DATA:  Preoperative exam prior left shoulder replacement ; history of hypertension and previous tobacco use  EXAM: CHEST  2 VIEW  COMPARISON:  None.  FINDINGS: The lungs are adequately inflated. There is no focal infiltrate. The interstitial markings are coarse bilaterally. The heart and pulmonary vascularity are normal. There is no pleural effusion. There is apical pleural thickening bilaterally. The bony thorax exhibits no acute abnormality. There are degenerative changes of the left shoulder.  IMPRESSION: There is no active cardiopulmonary disease. Mildly increased interstitial markings diffusely are consistent with the patient's smoking history.   Electronically Signed   By: David  Martinique   On: 08/25/2014 13:51   Dg Shoulder Left Port  09/03/2014   CLINICAL DATA:  Status post arthroplasty  EXAM: LEFT  SHOULDER - 1 VIEW  COMPARISON:  CT left shoulder July 22, 2014  FINDINGS: Patient is status post arthroplasty with prosthetic component appearing well-seated. Remodeling of the glenoid is consistent with the advanced osteoarthritis noted preoperatively. No acute fracture or dislocation.  IMPRESSION: Prosthetic component well-seated.  No acute fracture or dislocation.   Electronically Signed   By: Lowella Grip M.D.   On: 09/03/2014 14:43    Disposition: 01-Home or Self Care      Discharge Instructions    Call MD / Call 911    Complete by:  As directed   If you experience chest pain or shortness of breath, CALL 911 and be transported to the hospital emergency room.  If you develope a fever above 101 F, pus (white drainage) or increased drainage or redness at the wound, or calf pain, call your surgeon's office.     Constipation Prevention    Complete by:  As directed   Drink plenty of fluids.  Prune juice may be helpful.  You may use a stool softener, such as Colace (over the counter) 100 mg twice a day.  Use MiraLax (over the counter) for constipation as needed.     Diet - low sodium heart healthy    Complete by:  As directed      Increase activity slowly as tolerated    Complete by:  As directed            Follow-up Information    Follow up with Nita Sells, MD. Schedule an appointment as soon as possible for a visit in 2 weeks.   Specialty:  Orthopedic Surgery   Contact information:   [address] [place] [address] [phone]        Signed: Grier Mitts 09/07/2014, 9:08 AM

## 2014-09-07 NOTE — H&P (Signed)
Joe Patton. is an 68 y.o. male.   Chief Complaint:  L shoulder pain and dysfunction HPI: 68 y.o. male With endstage left shoulder arthritis. Pain and dysfunction interfered with quality of life and nonoperative treatment with activity modification, NSAIDS and injections failed.  Past Medical History  Diagnosis Date  . Viral warts   . Hypertension   . Arthritis   . Actinic keratosis   . Osteoarthrosis   . Erectile dysfunction   . Degeneration, intervertebral disc, lumbosacral   . Bilirubinuria   . Sinusitis   . Allergic rhinitis   . Eustachian tube dysfunction   . BPH (benign prostatic hyperplasia)   . Sleep apnea     uses CPAP, will bring mask  . Anxiety   . GERD (gastroesophageal reflux disease)     OTC if needed Pepcid  . Fibromyalgia     Past Surgical History  Procedure Laterality Date  . Cataract extraction      right  . Gallbladder surgery    . Hip surgery      left  . Tonsillectomy    . Eye surgery Right   . Cholecystectomy  2005  . Joint replacement      right and left total hips  . Total shoulder arthroplasty Left 09/03/2014    Procedure: TOTAL SHOULDER ARTHROPLASTY;  Surgeon: Nita Sells, MD;  Location: [place];  Service: Orthopedics;  Laterality: Left;    Family History  Problem Relation Age of Onset  . Breast cancer Mother   . Hypertension Father   . Heart attack Father    Social History:  reports that he quit smoking about 6 years ago. His smoking use included Cigarettes. He has a 30 pack-year smoking history. He has never used smokeless tobacco. He reports that he does not drink alcohol or use illicit drugs.  Allergies:  Allergies  Allergen Reactions  . Latex Rash    No prescriptions prior to admission    No results found for this or any previous visit (from the past 48 hour(s)). No results found.  Review of Systems  All other systems reviewed and are negative.   Blood pressure 132/71, pulse 79, temperature 98.4 F  (36.9 C), temperature source Oral, resp. rate 16, height 6\' 4"  (1.93 m), weight 117.935 kg (260 lb), SpO2 96 %. Physical Exam  Constitutional: He is oriented to person, place, and time. He appears well-developed and well-nourished.  HENT:  Head: Atraumatic.  Eyes: EOM are normal.  Cardiovascular: Intact distal pulses.   Respiratory: Effort normal.  Musculoskeletal:  L shoulder pain /crepitance with ROM  Neurological: He is alert and oriented to person, place, and time.  Skin: Skin is warm and dry.  Psychiatric: He has a normal mood and affect.     Assessment/Plan L shoulder endstage osteoarthritis Plan L shoulder replacement Risks / benefits of surgery discussed Consent on chart  NPO for OR Preop antibiotics   Maylea Soria WILLIAM 09/07/2014, 11:26 AM

## 2014-09-17 DIAGNOSIS — M170 Bilateral primary osteoarthritis of knee: Secondary | ICD-10-CM | POA: Diagnosis not present

## 2014-09-18 DIAGNOSIS — M19012 Primary osteoarthritis, left shoulder: Secondary | ICD-10-CM | POA: Diagnosis not present

## 2014-09-30 ENCOUNTER — Telehealth: Payer: Self-pay | Admitting: Pulmonary Disease

## 2014-09-30 DIAGNOSIS — G4733 Obstructive sleep apnea (adult) (pediatric): Secondary | ICD-10-CM

## 2014-09-30 NOTE — Telephone Encounter (Signed)
Pt states that his CPAP machine pressure is too high and mask is not sealing. Pt states that he is supposed to be on a fixed pressure of 12 but somehow the pressure has increased. Pt states that it makes a lot of noise and and is too uncomfortable. Pt requests that order be placed for machine to be checked to make sure it is working properly an also to have his pressure changed to AUTO CPAP with a max pressure of 12 and for it to RAMP up from a low setting. Please advise Dr Elsworth Soho. Thanks.   Pt requesting this be taken care of before the Holiday as we are closed until Monday.

## 2014-10-01 NOTE — Telephone Encounter (Signed)
Called and spoke to pt. Informed pt of the recs per RA. Order placed. Pt verbalized understanding and denied any further questions or concerns at this time.

## 2014-10-01 NOTE — Telephone Encounter (Signed)
Ok to cahnge back to autoCPAP 5-12 cm, download in 76month

## 2014-10-08 ENCOUNTER — Telehealth: Payer: Self-pay | Admitting: Pulmonary Disease

## 2014-10-08 DIAGNOSIS — G4733 Obstructive sleep apnea (adult) (pediatric): Secondary | ICD-10-CM

## 2014-10-08 NOTE — Telephone Encounter (Signed)
Download 09/28/14 - on 12cm, good usage, good control of events, no changes.

## 2014-10-08 NOTE — Telephone Encounter (Signed)
Spoke to patient, he says that he spoke with you before about getting the machine to gradually increase to 12cm so he can get to sleep.  He said that we would do that, he wants that done.  He says that he is having trouble going to sleep at 12cm and wants it to start out slowly, then increase to 12cm.  He wants a response ASAP.

## 2014-10-09 NOTE — Telephone Encounter (Signed)
Sent order for RAMP x 20 mins.  Nothing further needed.

## 2014-10-09 NOTE — Telephone Encounter (Signed)
OK to add ramp x 20 mins

## 2014-10-14 DIAGNOSIS — Z96612 Presence of left artificial shoulder joint: Secondary | ICD-10-CM | POA: Diagnosis not present

## 2014-10-14 DIAGNOSIS — Z471 Aftercare following joint replacement surgery: Secondary | ICD-10-CM | POA: Diagnosis not present

## 2014-10-21 DIAGNOSIS — M6281 Muscle weakness (generalized): Secondary | ICD-10-CM | POA: Diagnosis not present

## 2014-10-21 DIAGNOSIS — M25612 Stiffness of left shoulder, not elsewhere classified: Secondary | ICD-10-CM | POA: Diagnosis not present

## 2014-10-21 DIAGNOSIS — Z96612 Presence of left artificial shoulder joint: Secondary | ICD-10-CM | POA: Diagnosis not present

## 2014-10-21 DIAGNOSIS — M25512 Pain in left shoulder: Secondary | ICD-10-CM | POA: Diagnosis not present

## 2014-10-29 DIAGNOSIS — M25612 Stiffness of left shoulder, not elsewhere classified: Secondary | ICD-10-CM | POA: Diagnosis not present

## 2014-10-29 DIAGNOSIS — M25512 Pain in left shoulder: Secondary | ICD-10-CM | POA: Diagnosis not present

## 2014-10-29 DIAGNOSIS — M6281 Muscle weakness (generalized): Secondary | ICD-10-CM | POA: Diagnosis not present

## 2014-10-29 DIAGNOSIS — Z96612 Presence of left artificial shoulder joint: Secondary | ICD-10-CM | POA: Diagnosis not present

## 2014-11-03 DIAGNOSIS — M25512 Pain in left shoulder: Secondary | ICD-10-CM | POA: Diagnosis not present

## 2014-11-03 DIAGNOSIS — M6281 Muscle weakness (generalized): Secondary | ICD-10-CM | POA: Diagnosis not present

## 2014-11-03 DIAGNOSIS — Z96612 Presence of left artificial shoulder joint: Secondary | ICD-10-CM | POA: Diagnosis not present

## 2014-11-03 DIAGNOSIS — M25612 Stiffness of left shoulder, not elsewhere classified: Secondary | ICD-10-CM | POA: Diagnosis not present

## 2014-11-05 DIAGNOSIS — Z96612 Presence of left artificial shoulder joint: Secondary | ICD-10-CM | POA: Diagnosis not present

## 2014-11-05 DIAGNOSIS — M25612 Stiffness of left shoulder, not elsewhere classified: Secondary | ICD-10-CM | POA: Diagnosis not present

## 2014-11-05 DIAGNOSIS — M6281 Muscle weakness (generalized): Secondary | ICD-10-CM | POA: Diagnosis not present

## 2014-11-05 DIAGNOSIS — M25512 Pain in left shoulder: Secondary | ICD-10-CM | POA: Diagnosis not present

## 2014-11-12 DIAGNOSIS — M25612 Stiffness of left shoulder, not elsewhere classified: Secondary | ICD-10-CM | POA: Diagnosis not present

## 2014-11-12 DIAGNOSIS — M25512 Pain in left shoulder: Secondary | ICD-10-CM | POA: Diagnosis not present

## 2014-11-12 DIAGNOSIS — M6281 Muscle weakness (generalized): Secondary | ICD-10-CM | POA: Diagnosis not present

## 2014-11-12 DIAGNOSIS — Z96612 Presence of left artificial shoulder joint: Secondary | ICD-10-CM | POA: Diagnosis not present

## 2014-11-17 DIAGNOSIS — Z96612 Presence of left artificial shoulder joint: Secondary | ICD-10-CM | POA: Diagnosis not present

## 2014-11-17 DIAGNOSIS — M25512 Pain in left shoulder: Secondary | ICD-10-CM | POA: Diagnosis not present

## 2014-11-17 DIAGNOSIS — M6281 Muscle weakness (generalized): Secondary | ICD-10-CM | POA: Diagnosis not present

## 2014-11-17 DIAGNOSIS — M25612 Stiffness of left shoulder, not elsewhere classified: Secondary | ICD-10-CM | POA: Diagnosis not present

## 2014-11-19 DIAGNOSIS — Z96612 Presence of left artificial shoulder joint: Secondary | ICD-10-CM | POA: Diagnosis not present

## 2014-11-19 DIAGNOSIS — M25512 Pain in left shoulder: Secondary | ICD-10-CM | POA: Diagnosis not present

## 2014-11-19 DIAGNOSIS — M25612 Stiffness of left shoulder, not elsewhere classified: Secondary | ICD-10-CM | POA: Diagnosis not present

## 2014-11-19 DIAGNOSIS — M6281 Muscle weakness (generalized): Secondary | ICD-10-CM | POA: Diagnosis not present

## 2014-11-24 DIAGNOSIS — M25612 Stiffness of left shoulder, not elsewhere classified: Secondary | ICD-10-CM | POA: Diagnosis not present

## 2014-11-24 DIAGNOSIS — Z96612 Presence of left artificial shoulder joint: Secondary | ICD-10-CM | POA: Diagnosis not present

## 2014-11-24 DIAGNOSIS — M25512 Pain in left shoulder: Secondary | ICD-10-CM | POA: Diagnosis not present

## 2014-11-24 DIAGNOSIS — M6281 Muscle weakness (generalized): Secondary | ICD-10-CM | POA: Diagnosis not present

## 2014-11-25 DIAGNOSIS — Z471 Aftercare following joint replacement surgery: Secondary | ICD-10-CM | POA: Diagnosis not present

## 2014-11-25 DIAGNOSIS — Z96612 Presence of left artificial shoulder joint: Secondary | ICD-10-CM | POA: Diagnosis not present

## 2014-11-30 ENCOUNTER — Ambulatory Visit: Payer: Medicare Other | Admitting: Adult Health

## 2014-12-02 DIAGNOSIS — M6281 Muscle weakness (generalized): Secondary | ICD-10-CM | POA: Diagnosis not present

## 2014-12-02 DIAGNOSIS — M25512 Pain in left shoulder: Secondary | ICD-10-CM | POA: Diagnosis not present

## 2014-12-02 DIAGNOSIS — Z96612 Presence of left artificial shoulder joint: Secondary | ICD-10-CM | POA: Diagnosis not present

## 2014-12-02 DIAGNOSIS — M25612 Stiffness of left shoulder, not elsewhere classified: Secondary | ICD-10-CM | POA: Diagnosis not present

## 2014-12-10 DIAGNOSIS — M25612 Stiffness of left shoulder, not elsewhere classified: Secondary | ICD-10-CM | POA: Diagnosis not present

## 2014-12-10 DIAGNOSIS — M6281 Muscle weakness (generalized): Secondary | ICD-10-CM | POA: Diagnosis not present

## 2014-12-10 DIAGNOSIS — M25512 Pain in left shoulder: Secondary | ICD-10-CM | POA: Diagnosis not present

## 2014-12-10 DIAGNOSIS — Z96612 Presence of left artificial shoulder joint: Secondary | ICD-10-CM | POA: Diagnosis not present

## 2015-01-22 NOTE — Op Note (Signed)
PATIENT NAME:  Joe Patton, Joe Patton DATE OF BIRTH:  [birth year]  DATE OF PROCEDURE:  10/16/2012  PREOPERATIVE DIAGNOSIS: Benign prostatic hypertrophy with bladder outlet obstruction.   POSTOPERATIVE DIAGNOSIS:  Benign prostatic hypertrophy with bladder outlet obstruction.   PROCEDURE: Photoselective vaporization of the prostate.   SURGEON: Luisangel Wainright C. Bernardo Heater, M.D.   ASSISTANT: None.   ANESTHESIA: General.   INDICATIONS: [name] with bothersome lower urinary tract symptoms and nocturia. He has been on tamsulosin with slight improvement in his symptoms. He has requested surgical management and elected photoselective vaporization.   DESCRIPTION OF PROCEDURE: He was taken to the operating room where general anesthetic was administered. He was placed in the low lithotomy position and his external genitalia were prepped and draped in the usual fashion. A timeout was performed per protocol. A 21 French laser cystoscope was lubricated and passed under direct vision. The urethra was normal in caliber without stricture. Prostate showed only mild lateral lobe enlargement, however, there was a moderate size median lobe occluding the outlet. Bladder mucosa was closely inspected and there was no erythema, solid or papillary lesions. A GreenLight Moxy laser fiber was placed through the cystoscope. At a power setting of 80 watts, grooves were vaporized at the bladder neck working toward the verumontanum at the 5 o'clock and 7 o'clock positions. The intervening median lobe tissue was then vaporized without difficulty. Power was increased to a maximum of 160 watts. A small amount of the lateral lobe tissue was also vaporized. At the completion of the procedure, with scope positioned at the verumontanum, the channel was opened. No significant bleeding was noted. Total laser time was 25 minutes 2 seconds; 171,509 joules were delivered. There was a small amount of  median lobe tissue that was retrieved from the  bladder and sent as a specimen. The cystoscope was removed. A 22 French Foley catheter was placed with the aid of a catheter guide. Catheter was irrigated with return of clear effluent. A B and O suppository was placed per rectum. The patient was taken to PACU in stable condition. There were no complications.   ESTIMATED BLOOD LOSS: Minimal.     ____________________________ Ronda Fairly. Bernardo Heater, MD scs:cc D: 10/17/2012 14:02:00 ET T: 10/17/2012 16:20:54 ET JOB#: [id number]  cc: Nicki Reaper C. Bernardo Heater, MD, <Dictator> [name] MD ELECTRONICALLY SIGNED 10/30/2012 8:03

## 2015-01-22 NOTE — H&P (Signed)
PATIENT NAME:  Joe Patton, Joe Patton MR#:  [id number] DATE OF BIRTH:  December 27, 1945  DATE OF ADMISSION:  02/02/2013  CHIEF COMPLAINT:  Right hip pain.   HISTORY OF PRESENT ILLNESS:  Joe Patton is a 69 year old male who underwent a right total hip replacement by Dr. Marry Guan approximately 3 weeks ago. He states that he was leaning over to place lotion on his leg today and felt an immediate pop. He was brought to [facility] Emergency Department, where he was diagnosed with a hip dislocation, for which I was asked to provide definitive consultation.   He complains of sharp, intermittent pain diffusely about the right hip region. He has been weight-bearing as tolerated.  He presents with his son this evening.   REVIEW OF SYSTEMS: NEUROLOGIC:  No loss of consciousness.  CARDIAC:  No chest pain.  RESPIRATORY:  No shortness of breath.  SKIN:  No laceration.  HEENT:  No headache or visual change.  GASTROINTESTINAL:  No nausea or vomiting.  MUSCULOSKELETAL:  Complains of right hip pain.   PAST MEDICAL HISTORY:  Remarkable for hypertension, depression and arthritis.   PAST SURGICAL HISTORY: Remarkable for bilateral total hip replacement   ALLERGIES:  Include LATEX.   SOCIAL HISTORY:  The patient is semi-retired. He presents today with his son.   PHYSICAL EXAMINATION:   GENERAL:  Pleasant, alert male appearing his stated age.  PSYCHIATRIC:  Mood and affect appropriate.  VITAL SIGNS: On presentation to the Emergency Department demonstrating a temperature of 98.5, pulse of 80, respirations of 18, blood pressure 173/85, 95% room air saturation.  HEENT:  Normocephalic, atraumatic. Sclerae clear. Oral mucosa membranes are moist.  LUNGS:  Clear to auscultation bilaterally.  HEART:  Regular rate and rhythm. Normal S1 and S2. No murmurs or gallops.  ABDOMINAL EXAMINATION:  Soft, nontender, nondistended. Positive bowel sounds.  LYMPHATIC EXAMINATION:  No swelling of right lower extremity.   SKIN EXAMINATION: Warm and intact over the right hip. There is a well-healed posterolateral incision.  VASCULAR EXAMINATION:  Shows 2+ dorsalis pedis and posterior tibialis pulse right lower extremity.  NEUROLOGIC EXAMINATION: Showing motor and light touch sensation examination intact in the right lower extremity. MUSCULOSKELETAL EXAMINATION:  Reveals bilateral upper extremities and left lower extremity nontender to palpation and without deformity. Right lower extremity evaluation reveals that the extremity is externally rotated and shortened approximately 4 cm. There is marked tenderness to palpation diffusely about the right hip.   DIAGNOSTIC DATA:  X-ray evaluation shows an apparent anterolateral dislocation, though given his mechanism, it is  almost certainly a posterolateral dislocation.   PROCEDURE NOTE: Closed reduction of his right hip dislocation was performed under moderate sedation in the Emergency Department. This will be dictated as a separate note.   IMPRESSION:  Right posterolateral periprosthetic dislocation.   PLAN:  Closed reduction done this evening. We will place an abduction pillow. I will admit him to observation for probable placement of an abduction brace tomorrow. We will keep him at bedrest until Dr. Marry Guan sees him tomorrow.   The importance of posterior hip precautions was explained to the patient and his son.     ____________________________ Maebelle Munroe, MD jfs:mr D: 02/02/2013 22:15:06 ET T: 02/02/2013 22:46:12 ET JOB#: [id number]  cc: Maebelle Munroe, MD, <Dictator> Maebelle Munroe MD ELECTRONICALLY SIGNED 02/03/2013 0:02

## 2015-01-22 NOTE — Discharge Summary (Signed)
PATIENT NAME:  Joe Patton, FOTHERINGHAM MR#:  [id number] DATE OF BIRTH:  [birth year]  DATE OF ADMISSION:  01/08/2013 DATE OF DISCHARGE:  01/11/2013  DICTATING FOR: Jeneen Rinks P. Holley Bouche., MD  ADMITTING DIAGNOSIS: Degenerative arthrosis of right hip.   DISCHARGE DIAGNOSIS: Degenerative arthrosis of right hip.   HISTORY: The patient is a 69 year old gentleman who has been followed at [facility] for progression of right hip pain. He had reported a several-year history of right groin pain that had increased in intensity over the course of the last year. He had denied any specific trauma or aggravating event. The pain was aggravated with weight-bearing activities. The patient had noticed occasional radiation of pain into the thigh towards the area of the knee. He apparently uses a lift in his right shoe due to a leg length discrepancy that was noted after he underwent a left total hip arthroplasty at [facility] approximately 12 years ago. At the time of surgery, he was not using any ambulatory aid. The patient has not seen any improvement in his condition despite the use of anti-inflammatory medications as well as activity modification. The patient states that the pain increased to the point that it was significantly interfering with his activities of daily living. X-rays taken in the [place] showed narrowing of the cartilage space with subchondral sclerosis. After discussion of the risks and benefits of surgical intervention, the patient expressed his understanding of the risks and benefits and agreed for plans for surgical intervention.   PROCEDURE: Right total hip arthroplasty.   ANESTHESIA: Spinal.   IMPLANTS UTILIZED: DePuy 13.5 mm large stature AML femoral component, a 52 mm outer diameter Pinnacle 100 acetabular component, a +4 mm neutral Pinnacle Marathon polyethylene liner, and a 36 mm outer diameter M-Spec femoral head with a +1.5 mm neck length.   HOSPITAL COURSE: The patient  tolerated the procedure very well. He had no complications. He was then taken to the PACU, where he was stabilized and then transferred to the orthopedic floor. He began receiving anticoagulation therapy of Lovenox 30 mg subcutaneous q.12 hours per anesthesia and pharmacy protocol. He was fitted with TED stockings bilaterally. These were allowed to be removed 1 hour per 8-hour shift. The patient was also fitted with the AVI compression foot pumps bilaterally set at 80 mmHg. His calves have been nontender. There has been no evidence of any DVTs. Denies any chest pain or shortness of breath. Heels were elevated off the bed using rolled towels.   Vital signs have been stable. He has been afebrile. Hemodynamically, he is stable. No transfusions were given.   Physical therapy was initiated on day 1 for gait training and transfers. He has done very well. Upon being discharged, he was ambulating greater than 200 feet. He was able to go up and down 4 sets of steps. He was independent with bed to chair transfers. Occupational therapy was also initiated on day 1 for ADLs and assistive devices.   The patient's IV, Foley and Hemovac were discontinued on day 2 along with a dressing change. The wound was free of any drainage or signs of infection.   DISPOSITION: The patient is being discharged to home in improved stable condition.   DISCHARGE INSTRUCTIONS: 1. He may weight bear as tolerated. Did go over hip precautions once again.  2. He will receive home health PT for 6 weeks.  3. Continue using a walker until cleared by physical therapy to go to a quad cane.  4. Staples  will be removed 2 weeks postoperative, followed by the application of benzoin and Steri-Strips.  5. Continue with TED stockings. These are to be worn during the day but may be removed at night.  6. He was instructed on wound care.  7. He is placed on a regular diet.  8. He is to resume his regular medications that he was on prior to admission.  He was given a prescription for Lovenox 40 mg 1 injection subcutaneously daily for 14 days, then discontinue and begin taking one 81 mg enteric-coated aspirin. Also, a prescription for oxycodone 5 to 10 mg q.4-6 hours p.r.n. for pain and tramadol 50 to 100 mg q.4-6 hours p.r.n. for pain.  9. He is to call the clinic sooner if any temperatures of 101.5 or greater or excessive bleeding.   DRUG ALLERGIES: No known drug allergies.   PAST MEDICAL HISTORY:  1. Seasonal allergies.  2. Arthritis.  3. Cataracts.  4. Chickenpox.  5. Psoriasis.  6. Hypertension.  7. Benign prostatic hypertrophy.   ____________________________ Vance Peper, PA jrw:OSi D: 01/23/2013 08:31:18 ET T: 01/23/2013 08:53:06 ET JOB#: [id number]  cc: Vance Peper, PA, <Dictator> JON WOLFE PA ELECTRONICALLY SIGNED 01/23/2013 21:56

## 2015-01-22 NOTE — Op Note (Signed)
PATIENT NAME:  Joe Patton, Joe Patton MR#:  [id number] DATE OF BIRTH:  01-17-1946  DATE OF PROCEDURE:  01/08/2013  PREOPERATIVE DIAGNOSIS: Degenerative arthrosis of the right hip.   POSTOPERATIVE DIAGNOSIS: Degenerative arthrosis of the right hip.   PROCEDURE PERFORMED: Right total hip arthroplasty.   SURGEON: Laurice Record. Hooten, M.D.   ASSISTANT: Vance Peper, PA.   ANESTHESIA: Spinal.   ESTIMATED BLOOD LOSS: 200 mL.   FLUIDS REPLACED: 1500 mL of crystalloid.   DRAINS: Two medium drains to Hemovac reservoir.   IMPLANTS UTILIZED: DePuy 13.5 mm large stature AML femoral component, a 52 mm outer diameter Pinnacle 100 acetabular component, a +4 mm neutral Pinnacle Marathon polyethylene liner, and a 36 mm outer diameter M-SPEC femoral head with a +1.5 mm neck length.   INDICATIONS FOR SURGERY: The patient is a 69 year old gentleman, who has been seen for complaints of progressive right hip and groin pain with restricted range of motion. X-rays demonstrated severe degenerative changes to the right hip. He had not seen any significant improvement despite conservative nonsurgical intervention. After discussion of the risks and benefits of surgical intervention, the patient expressed understanding of the risks and benefits and agreed with plans for surgical intervention.   PROCEDURE IN DETAIL: The patient was brought in the operating room and, after adequate spinal anesthesia was achieved, the patient was placed in a left lateral decubitus position. Axillary roll was placed and all bony prominences were well padded. The patient's right hip and leg were cleaned and prepped with alcohol and DuraPrep and draped in the usual sterile fashion. A "timeout" was performed as per usual protocol. A lateral curvilinear incision was made gently curving towards the posterior superior iliac spine. IT band was incised in line with the skin incision and the fibers of the gluteus maximus were split in line. The piriformis tendon  was identified, skeletonized, and incised at its insertion and the proximal femur reflected posteriorly. In a similar fashion, short external rotators were incised and reflected posteriorly. A T-type posterior capsulotomy was performed. The femoral head was then dislocated posteriorly. Inspection of the femoral head demonstrated severe degenerative changes with full thickness loss of articular cartilage superiorly. Femoral neck cut was performed using an oscillating saw. The anterior capsule was elevated off of the femoral neck. Inspection of the acetabulum also demonstrated significant degenerative changes. Remnant of the labrum was excised. The acetabulum was then reamed in a sequential fashion up to a 51 mm diameter. Good punctate bleeding bone was encountered. A 52 mm outer diameter Pinnacle 100 acetabular component was positioned and impacted into place. Excellent scratch fit was appreciated. A +4 mm neutral polyethylene trial was inserted and attention was directed to the proximal femur. Pilot hole for reaming of the proximal femur was created using a high-speed bur. Proximal femoral canal was reamed in a sequential fashion up to a 13 mm diameter. This allowed for approximately 5 cm of scratch fit. The proximal femur was then prepared using a 13.5 mm aggressive side-biting reamer. Serial broaches were inserted up to a 13.5 mm large stature AML femoral component. The calcar region was planed accordingly and trial reduction was performed using a 36 mm hip ball with a +1.5 mm neck length. Good equalization of limb lengths was appreciated and excellent stability was appreciated both anteriorly and posteriorly. Trial components were removed. The acetabular shell was irrigated and then suctioned dry. A +4 mm neutral Pinnacle Marathon polyethylene liner was positioned and impacted into place.   Next, a 13.5 mm  large stature AML femoral component was positioned and impacted into place. Excellent scratch fit was  appreciated. Trial reduction was again performed with a 36 mm hip ball with a +1.5 mm neck length. Again, excellent stability was noted both anteriorly and posteriorly and good equalization of limb lengths was noted. Trial hip ball was removed. The Morse taper was cleaned and dried. A 36 mm outer diameter M-Spec femoral head with a +1.5 mm neck length was placed on the trunnion and impacted into place. The hip was then reduced and placed through a range of motion with excellent stability and equalization of limb lengths again noted.   The wound was irrigated with copious amounts of normal saline with antibiotic solution using pulsatile lavage and then suctioned dry. Good hemostasis was appreciated. The posterior capsulotomy was repaired using #5 Ethibond. The piriformis tendon was reapproximated on the undersurface of the gluteus medius tendon using #5 Ethibond. Two medium drains were placed in the wound bed and brought out through a separate stab incision to be attached to a Hemovac reservoir. The IT band was repaired using interrupted sutures of #1 Vicryl. The subcutaneous tissue was approximated in layers using first #0 Vicryl followed by #2-0 Vicryl. Skin was closed with skin staples. Sterile dressing was applied. The patient tolerated the procedure well. He was transported to the recovery room in stable condition.   ____________________________ Laurice Record. Holley Bouche., MD jph:aw D: 01/08/2013 21:49:27 ET T: 01/09/2013 06:43:08 ET JOB#: [id number]  cc: Laurice Record. Holley Bouche., MD, <Dictator> Laurice Record Holley Bouche MD ELECTRONICALLY SIGNED 01/13/2013 16:52

## 2015-01-22 NOTE — Op Note (Signed)
PATIENT NAME:  Joe Patton, Joe Patton MR#:  [id number] DATE OF BIRTH:  Jan 28, 1946  DATE OF PROCEDURE:  02/02/2013  PREOPERATIVE DIAGNOSIS: Right hip periprosthetic dislocation.   POSTOPERATIVE DIAGNOSIS: Right hip periprosthetic dislocation.  PROCEDURE PERFORMED: Closed reduction of right hip periprosthetic dislocation under  anesthesia, moderate/procedural sedation.   SURGEON: Durene Cal, MD   COMPLICATIONS: None apparent.   ANESTHESIA:  Moderate sedation.   ESTIMATED BLOOD LOSS: Not applicable.   IMPLANTS: Not applicable.   SPECIMENS: Not applicable.   DRAINS: Not applicable.   FINDINGS: Right hip posterolateral dislocation, reducible with traction and flexion to approximately 90 degrees and gentle internal and external rotation. Motor and light touch sensation examination intact postprocedure along with 2+ dorsalis pedis and posterior tibialis pulses.   INDICATIONS: Mr. [name] is a 69 year old male who underwent a total hip replacement approximately 3 weeks ago. He was leaning over to place lotion on his leg and, unfortunately, felt a pop in his right hip.  He was brought to [facility] Emergency Department where he was diagnosed with a hip dislocation for which I was asked to provide definitive treatment.   The risks, benefits and alternatives were discussed with him to include, but not limited to periprosthetic fracture, recurrent dislocation and inability to reduce the hip and neurovascular injury. He appeared to understand the risks and benefits and desired to proceed with closed reduction of his right hip.  Informed consent was obtained.     DESCRIPTION OF PROCEDURE: The correct procedural site was marked by myself and confirmed. Timeout was performed.  The Emergency Department, under Dr. Jacqlyn Krauss direction, administered  procedural/moderate sedation first with fentanyl and Versed and then switching to etomidate. Adequate relaxation was obtained. Traction and flexion as well as  gentle internal and external rotation were performed with a palpable and audible clunk. Leg lengths were equal thereafter.  Immediate AP and lateral of the right hip demonstrated concentric relocation and no apparent fracture. The patient was awakened from procedural sedation. These findings were relayed to his son, the patient, as well as the need for admission for probable placement of an abduction brace.   ____________________________ Maebelle Munroe, MD jfs:cb D: 02/02/2013 22:19:02 ET T: 02/02/2013 23:11:43 ET JOB#: [id number]  cc: Maebelle Munroe, MD, <Dictator> Maebelle Munroe MD ELECTRONICALLY SIGNED 02/03/2013 0:02

## 2015-02-24 DIAGNOSIS — Z96612 Presence of left artificial shoulder joint: Secondary | ICD-10-CM | POA: Diagnosis not present

## 2015-02-24 DIAGNOSIS — Z471 Aftercare following joint replacement surgery: Secondary | ICD-10-CM | POA: Diagnosis not present

## 2015-04-23 DIAGNOSIS — M170 Bilateral primary osteoarthritis of knee: Secondary | ICD-10-CM | POA: Diagnosis not present

## 2015-04-23 DIAGNOSIS — M25562 Pain in left knee: Secondary | ICD-10-CM | POA: Diagnosis not present

## 2015-04-23 DIAGNOSIS — M25561 Pain in right knee: Secondary | ICD-10-CM | POA: Diagnosis not present

## 2015-04-30 ENCOUNTER — Encounter: Payer: Self-pay | Admitting: Family Medicine

## 2015-04-30 ENCOUNTER — Ambulatory Visit (INDEPENDENT_AMBULATORY_CARE_PROVIDER_SITE_OTHER): Payer: Medicare Other | Admitting: Family Medicine

## 2015-04-30 VITALS — BP 130/80 | HR 60 | Ht 76.0 in | Wt 264.0 lb

## 2015-04-30 DIAGNOSIS — F32A Depression, unspecified: Secondary | ICD-10-CM

## 2015-04-30 DIAGNOSIS — F329 Major depressive disorder, single episode, unspecified: Secondary | ICD-10-CM | POA: Diagnosis not present

## 2015-04-30 MED ORDER — CITALOPRAM HYDROBROMIDE 20 MG PO TABS: 20.0000 mg | ORAL_TABLET | Freq: Every day | ORAL | Status: DC

## 2015-04-30 NOTE — Progress Notes (Signed)
Name: [facility].   MRN: [id number]    DOB: [birth year]   Date:04/30/2015       Progress Note  Subjective  Chief Complaint  Chief Complaint  Patient presents with  . Depression    Mental Health Problem The primary symptoms do not include dysphoric mood, delusions, hallucinations, bizarre behavior, disorganized speech, negative symptoms or somatic symptoms. The current episode started more than 1 month ago. This is a chronic problem.  The onset of the illness is precipitated by emotional stress. The degree of incapacity that he is experiencing as a consequence of his illness is moderate. Additional symptoms of the illness do not include no anhedonia, no insomnia, no hypersomnia, no appetite change, no unexpected weight change, no fatigue, no agitation, no psychomotor retardation, no feelings of worthlessness, no attention impairment, no euphoric mood, no increased goal-directed activity, no flight of ideas, no inflated self-esteem, no decreased need for sleep, not distractible, no poor judgment, no visual change, no headaches, no abdominal pain or no seizures. He does not admit to suicidal ideas. He does not have a plan to commit suicide.    No problem-specific assessment & plan notes found for this encounter.   Past Medical History  Diagnosis Date  . Viral warts   . Hypertension   . Arthritis   . Actinic keratosis   . Osteoarthrosis   . Erectile dysfunction   . Degeneration, intervertebral disc, lumbosacral   . Bilirubinuria   . Sinusitis   . Allergic rhinitis   . Eustachian tube dysfunction   . BPH (benign prostatic hyperplasia)   . Sleep apnea     uses CPAP, will bring mask  . Anxiety   . GERD (gastroesophageal reflux disease)     OTC if needed Pepcid  . Fibromyalgia     Past Surgical History  Procedure Laterality Date  . Cataract extraction      right  . Gallbladder surgery    . Hip surgery      left  . Tonsillectomy    . Eye surgery Right   .  Cholecystectomy  2005  . Joint replacement      right and left total hips  . 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 Colonoscopy  2014    cleared for 10 yrs- Dr Allen Norris    Family History  Problem Relation Age of Onset  . Breast cancer Mother   . Hypertension Father   . Heart attack Father     History   Social History  . Marital Status: Married    Spouse Name: N/A  . Number of Children: N/A  . Years of Education: N/A   Occupational History  . retired    Social History Main Topics  . Smoking status: Former Smoker -- 1.50 packs/day for 20 years    Types: Cigarettes    Quit date: 10/03/2007  . Smokeless tobacco: Never Used  . Alcohol Use: No  . Drug Use: No  . Sexual Activity: Yes   Other Topics Concern  . Not on file   Social History Narrative    Allergies  Allergen Reactions  . Latex Rash     Review of Systems  Constitutional: Negative for fever, chills, weight loss, malaise/fatigue, appetite change, fatigue and unexpected weight change.  HENT: Negative for ear discharge, ear pain and sore throat.   Eyes: Negative  for blurred vision.  Respiratory: Negative for cough, sputum production, shortness of breath and wheezing.   Cardiovascular: Negative for chest pain, palpitations and leg swelling.  Gastrointestinal: Negative for heartburn, nausea, abdominal pain, diarrhea, constipation, blood in stool and melena.  Genitourinary: Negative for dysuria, urgency, frequency and hematuria.  Musculoskeletal: Negative for myalgias, back pain, joint pain and neck pain.  Skin: Negative for rash.  Neurological: Negative for dizziness, tingling, sensory change, focal weakness, seizures and headaches.  Endo/Heme/Allergies: Negative for environmental allergies and polydipsia. Does not bruise/bleed easily.  Psychiatric/Behavioral:  Negative for depression, suicidal ideas, hallucinations, dysphoric mood and agitation. The patient is not nervous/anxious and does not have insomnia.      Objective  Filed Vitals:   04/30/15 1541  BP: 130/80  Pulse: 60  Height: 6\' 4"  (1.93 m)  Weight: 264 lb (119.75 kg)    Physical Exam  Constitutional: He is oriented to person, place, and time and well-developed, well-nourished, and in no distress.  HENT:  Head: Normocephalic.  Right Ear: External ear normal.  Left Ear: External ear normal.  Nose: Nose normal.  Mouth/Throat: Oropharynx is clear and moist.  Eyes: Conjunctivae and EOM are normal. Pupils are equal, round, and reactive to light. Right eye exhibits no discharge. Left eye exhibits no discharge. No scleral icterus.  Neck: Normal range of motion. Neck supple. No JVD present. No tracheal deviation present. No thyromegaly present.  Cardiovascular: Normal rate, regular rhythm, normal heart sounds and intact distal pulses.  Exam reveals no gallop and no friction rub.   No murmur heard. Pulmonary/Chest: Breath sounds normal. No respiratory distress. He has no wheezes. He has no rales.  Abdominal: Soft. Bowel sounds are normal. He exhibits no mass. There is no hepatosplenomegaly. There is no tenderness. There is no rebound, no guarding and no CVA tenderness.  Musculoskeletal: Normal range of motion. He exhibits no edema or tenderness.  Lymphadenopathy:    He has no cervical adenopathy.  Neurological: He is alert and oriented to person, place, and time. He has normal sensation, normal strength, normal reflexes and intact cranial nerves. No cranial nerve deficit.  Skin: Skin is warm. No rash noted.  Psychiatric: Mood and affect normal.      Assessment & Plan  Problem List Items Addressed This Visit    None    Visit Diagnoses    Depression    -  Primary    Relevant Medications    citalopram (CELEXA) 20 MG tablet         Dr. Tiphanie Vo [facility]  04/30/2015

## 2015-05-13 IMAGING — CR DG CHEST 2V
2 series · 2 of 2 positions shown · non-contrast
Comparison: None.

CLINICAL DATA: Preoperative exam prior left shoulder replacement ;
history of hypertension and previous tobacco use

EXAM:
CHEST  2 VIEW

[w chest pa]
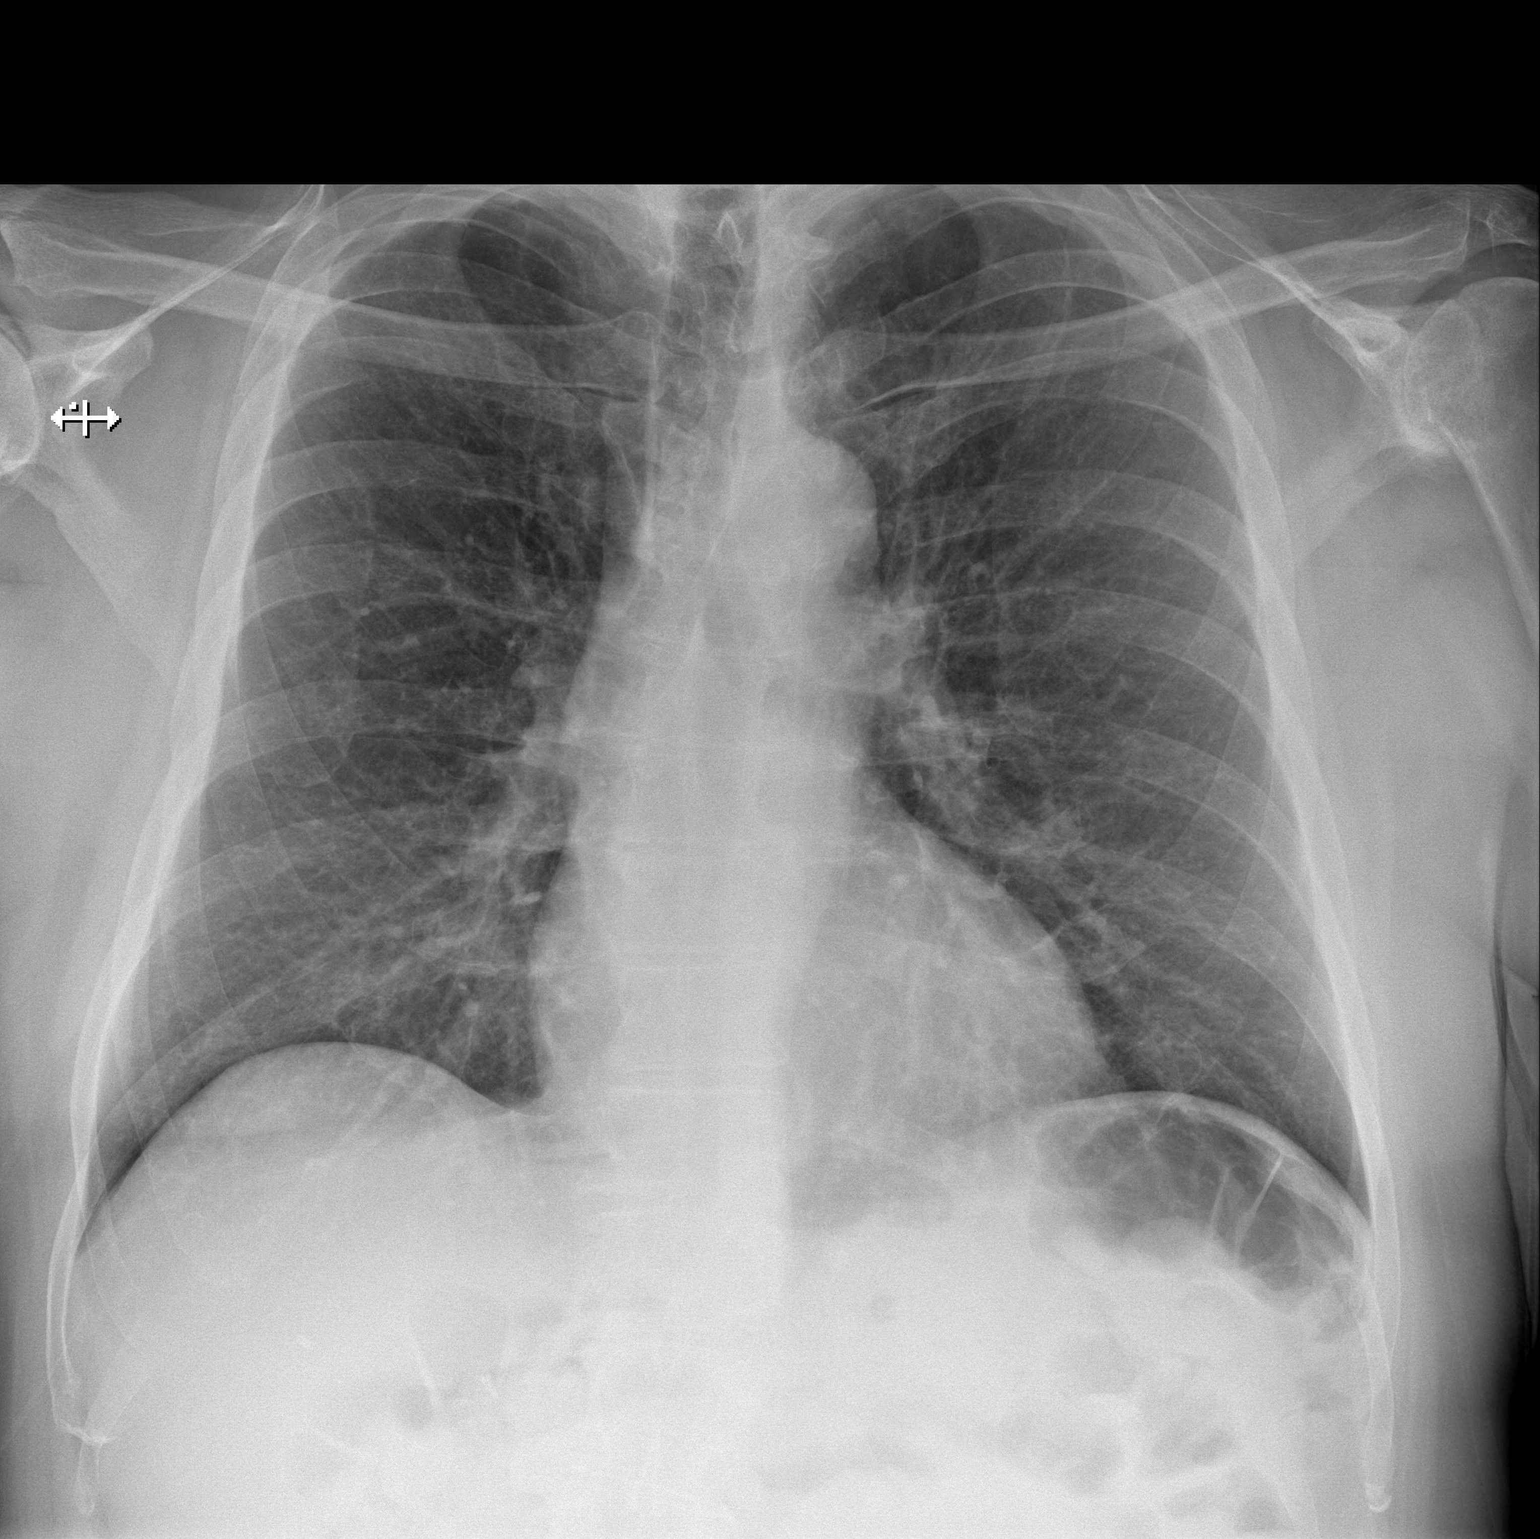

[w chest lat]
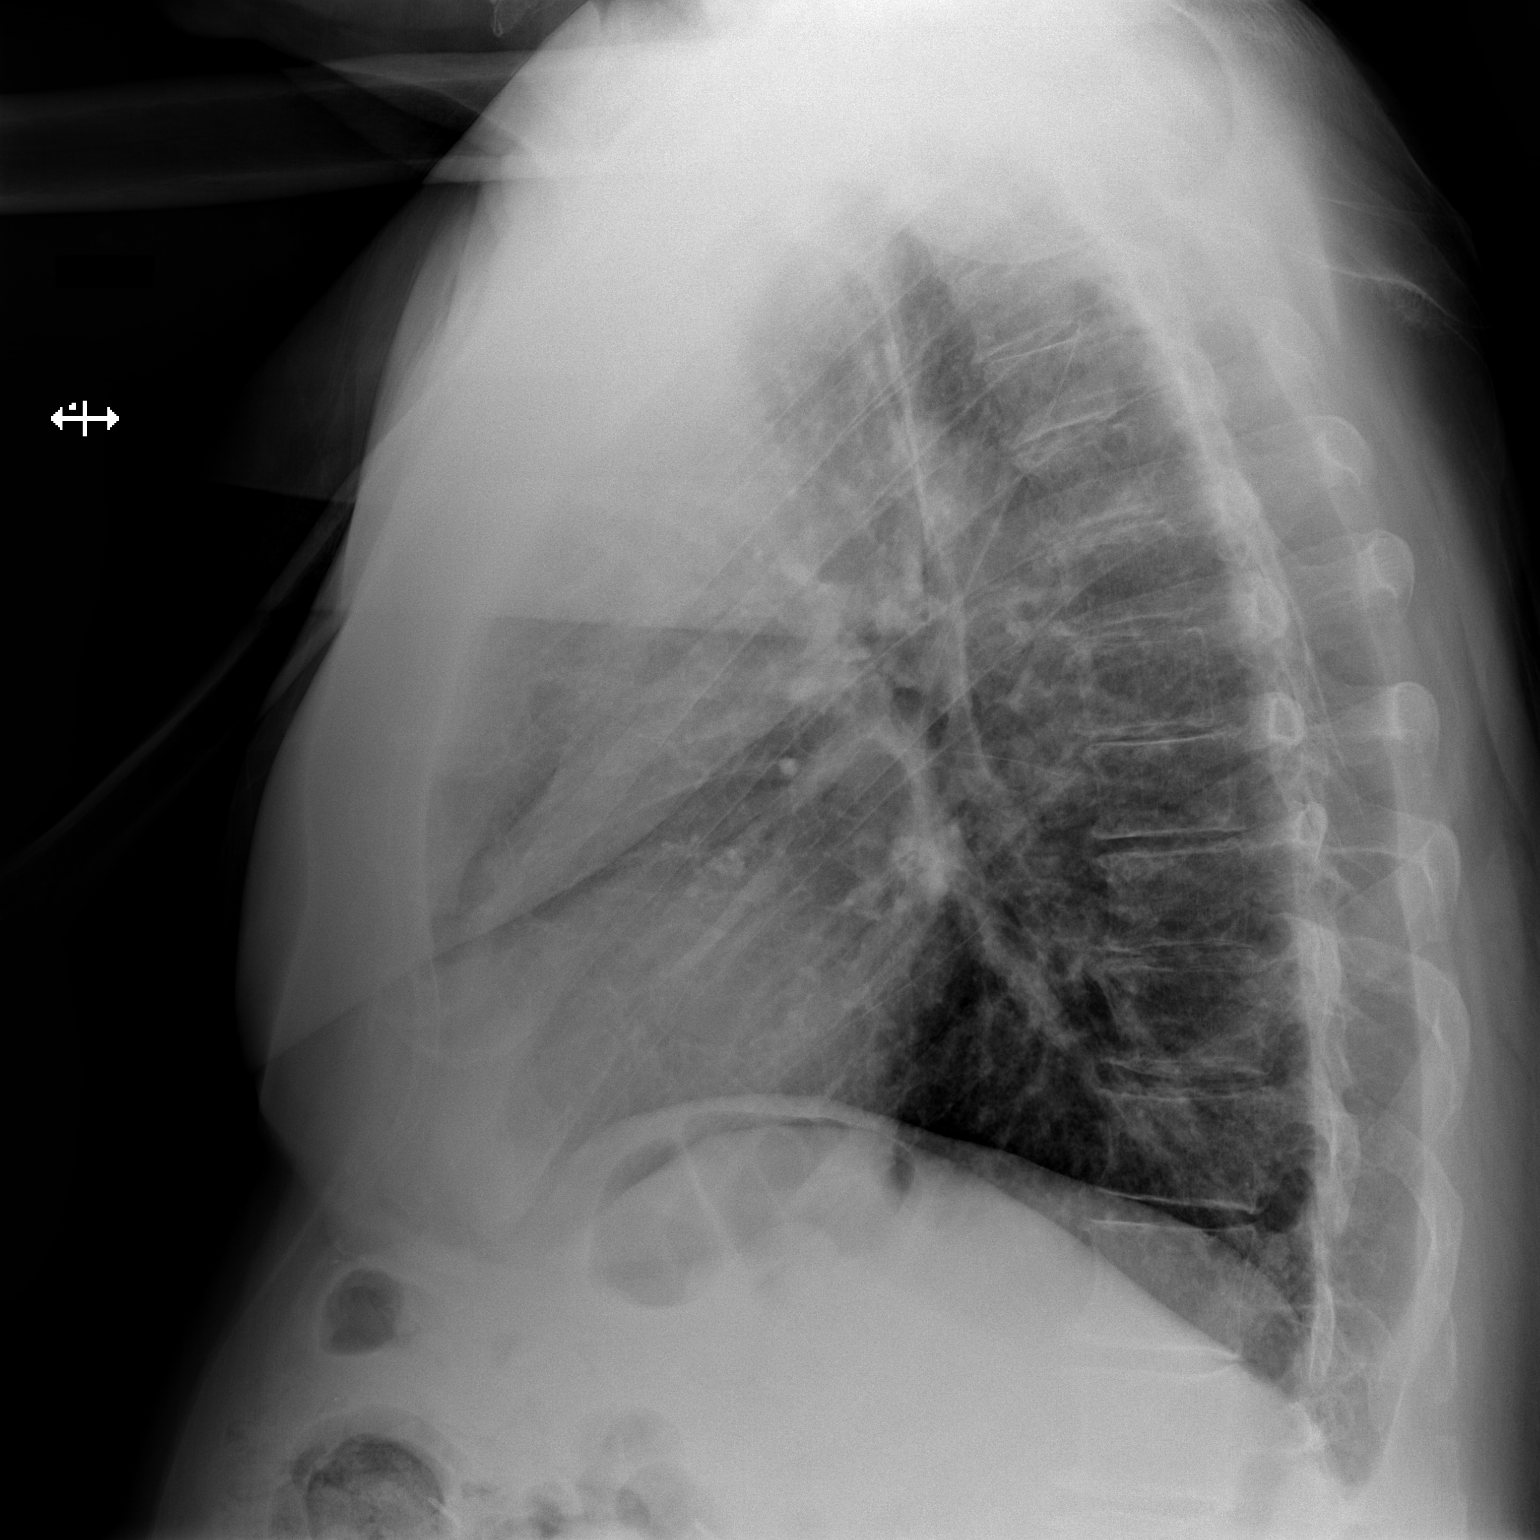

[2 of 2 positions shown; findings below may reference images not displayed]

FINDINGS: The lungs are adequately inflated. There is no focal infiltrate. The
interstitial markings are coarse bilaterally. The heart and
pulmonary vascularity are normal. There is no pleural effusion.
There is apical pleural thickening bilaterally. The bony thorax
exhibits no acute abnormality. There are degenerative changes of the
left shoulder.
IMPRESSION: There is no active cardiopulmonary disease. Mildly increased
interstitial markings diffusely are consistent with the patient's
smoking history.

## 2015-06-02 DIAGNOSIS — R011 Cardiac murmur, unspecified: Secondary | ICD-10-CM | POA: Diagnosis not present

## 2015-06-02 DIAGNOSIS — R9431 Abnormal electrocardiogram [ECG] [EKG]: Secondary | ICD-10-CM | POA: Diagnosis not present

## 2015-06-02 DIAGNOSIS — I10 Essential (primary) hypertension: Secondary | ICD-10-CM | POA: Diagnosis not present

## 2015-06-02 DIAGNOSIS — E669 Obesity, unspecified: Secondary | ICD-10-CM | POA: Diagnosis not present

## 2015-06-14 DIAGNOSIS — Z79899 Other long term (current) drug therapy: Secondary | ICD-10-CM | POA: Diagnosis not present

## 2015-06-14 DIAGNOSIS — N529 Male erectile dysfunction, unspecified: Secondary | ICD-10-CM | POA: Diagnosis not present

## 2015-06-14 DIAGNOSIS — R350 Frequency of micturition: Secondary | ICD-10-CM | POA: Diagnosis not present

## 2015-06-14 DIAGNOSIS — N401 Enlarged prostate with lower urinary tract symptoms: Secondary | ICD-10-CM | POA: Diagnosis not present

## 2015-06-14 DIAGNOSIS — R3915 Urgency of urination: Secondary | ICD-10-CM | POA: Diagnosis not present

## 2015-06-14 DIAGNOSIS — I10 Essential (primary) hypertension: Secondary | ICD-10-CM | POA: Diagnosis not present

## 2015-06-14 DIAGNOSIS — Z87891 Personal history of nicotine dependence: Secondary | ICD-10-CM | POA: Diagnosis not present

## 2015-07-02 DIAGNOSIS — Z9841 Cataract extraction status, right eye: Secondary | ICD-10-CM | POA: Diagnosis not present

## 2015-07-02 DIAGNOSIS — H2512 Age-related nuclear cataract, left eye: Secondary | ICD-10-CM | POA: Diagnosis not present

## 2015-07-02 DIAGNOSIS — Z961 Presence of intraocular lens: Secondary | ICD-10-CM | POA: Diagnosis not present

## 2015-07-02 DIAGNOSIS — H26491 Other secondary cataract, right eye: Secondary | ICD-10-CM | POA: Diagnosis not present

## 2015-07-02 DIAGNOSIS — H25042 Posterior subcapsular polar age-related cataract, left eye: Secondary | ICD-10-CM | POA: Diagnosis not present

## 2015-07-05 DIAGNOSIS — I8311 Varicose veins of right lower extremity with inflammation: Secondary | ICD-10-CM | POA: Diagnosis not present

## 2015-07-05 DIAGNOSIS — D485 Neoplasm of uncertain behavior of skin: Secondary | ICD-10-CM | POA: Diagnosis not present

## 2015-07-05 DIAGNOSIS — L570 Actinic keratosis: Secondary | ICD-10-CM | POA: Diagnosis not present

## 2015-07-05 DIAGNOSIS — L989 Disorder of the skin and subcutaneous tissue, unspecified: Secondary | ICD-10-CM | POA: Diagnosis not present

## 2015-07-08 ENCOUNTER — Encounter: Payer: Self-pay | Admitting: Family Medicine

## 2015-07-08 ENCOUNTER — Ambulatory Visit (INDEPENDENT_AMBULATORY_CARE_PROVIDER_SITE_OTHER): Payer: Medicare Other | Admitting: Family Medicine

## 2015-07-08 VITALS — BP 124/70 | HR 78 | Ht 76.0 in | Wt 257.0 lb

## 2015-07-08 DIAGNOSIS — F332 Major depressive disorder, recurrent severe without psychotic features: Secondary | ICD-10-CM | POA: Diagnosis not present

## 2015-07-08 MED ORDER — CITALOPRAM HYDROBROMIDE 20 MG PO TABS: 20.0000 mg | ORAL_TABLET | Freq: Every day | ORAL | Status: DC

## 2015-07-08 NOTE — Progress Notes (Signed)
Name: [facility].   MRN: [id number]    DOB: 06-Mar-1946   Date:07/08/2015       Progress Note  Subjective  Chief Complaint  Chief Complaint  Patient presents with  . Depression    stopped Celexa due to interaction with Tramadol- feeling down and depressed.     Depression        This is a chronic problem.  The current episode started more than 1 year ago.   The onset quality is gradual.   The problem occurs daily.  The problem has been rapidly worsening (since cessation) since onset.  Associated symptoms include decreased concentration, hopelessness, insomnia, irritable, decreased interest, myalgias and sad.  Associated symptoms include no fatigue, no helplessness, no restlessness, no appetite change, no body aches, no headaches and no suicidal ideas.     The symptoms are aggravated by work stress.  Past treatments include SSRIs - Selective serotonin reuptake inhibitors.  Compliance with treatment is good.   No problem-specific assessment & plan notes found for this encounter.   Past Medical History  Diagnosis Date  . Viral warts   . Hypertension   . Arthritis   . Actinic keratosis   . Osteoarthrosis   . Erectile dysfunction   . Degeneration, intervertebral disc, lumbosacral   . Bilirubinuria   . Sinusitis   . Allergic rhinitis   . Eustachian tube dysfunction   . BPH (benign prostatic hyperplasia)   . Sleep apnea     uses CPAP, will bring mask  . Anxiety   . GERD (gastroesophageal reflux disease)     OTC if needed Pepcid  . Fibromyalgia     Past Surgical History  Procedure Laterality Date  . Cataract extraction      right  . Gallbladder surgery    . Hip surgery      left  . Tonsillectomy    . Eye surgery Right   . Cholecystectomy  2005  . Joint replacement      right and left total hips  . 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 Colonoscopy  2014    cleared for 10 yrs- Dr Allen Norris    Family History  Problem Relation Age of Onset  . Breast cancer Mother   . Hypertension Father   . Heart attack Father     Social History   Social History  . Marital Status: Married    Spouse Name: N/A  . Number of Children: N/A  . Years of Education: N/A   Occupational History  . retired    Social History Main Topics  . Smoking status: Former Smoker -- 1.50 packs/day for 20 years    Types: Cigarettes    Quit date: 10/03/2007  . Smokeless tobacco: Never Used  . Alcohol Use: No  . Drug Use: No  . Sexual Activity: Yes   Other Topics Concern  . Not on file   Social History Narrative    Allergies  Allergen Reactions  . Latex Rash     Review of Systems  Constitutional: Negative for fever, chills, weight loss, malaise/fatigue, appetite change and fatigue.  HENT: Negative for ear discharge, ear pain and sore throat.   Eyes: Negative for blurred vision.  Respiratory: Negative for cough, sputum production, shortness of breath and wheezing.   Cardiovascular: Negative for chest pain, palpitations and leg swelling.  Gastrointestinal: Negative for heartburn, nausea, abdominal pain, diarrhea, constipation, blood in stool and melena.  Genitourinary: Negative for dysuria, urgency, frequency and hematuria.  Musculoskeletal: Positive for myalgias. Negative for back pain, joint pain and neck pain.  Skin: Negative for rash.  Neurological: Negative for dizziness, tingling, sensory change, focal weakness and headaches.  Endo/Heme/Allergies: Negative for environmental allergies and polydipsia. Does not bruise/bleed easily.  Psychiatric/Behavioral: Positive for depression and decreased concentration. Negative for suicidal ideas and substance abuse. The patient is nervous/anxious and has insomnia.      Objective  Filed Vitals:   07/08/15 1358  BP: 124/70  Pulse: 78  Height: 6\' 4"  (1.93 m)  Weight: 257 lb  (116.574 kg)    Physical Exam  Constitutional: He is oriented to person, place, and time and well-developed, well-nourished, and in no distress. He is irritable.  HENT:  Head: Normocephalic.  Right Ear: External ear normal.  Left Ear: External ear normal.  Nose: Nose normal.  Mouth/Throat: Oropharynx is clear and moist.  Eyes: Conjunctivae and EOM are normal. Pupils are equal, round, and reactive to light. Right eye exhibits no discharge. Left eye exhibits no discharge. No scleral icterus.  Neck: Normal range of motion. Neck supple. No JVD present. No tracheal deviation present. No thyromegaly present.  Cardiovascular: Normal rate, regular rhythm, normal heart sounds and intact distal pulses.  Exam reveals no gallop and no friction rub.   No murmur heard. Pulmonary/Chest: Breath sounds normal. No respiratory distress. He has no wheezes. He has no rales.  Abdominal: Soft. Bowel sounds are normal. He exhibits no mass. There is no hepatosplenomegaly. There is no tenderness. There is no rebound, no guarding and no CVA tenderness.  Musculoskeletal: Normal range of motion. He exhibits no edema or tenderness.  Lymphadenopathy:    He has no cervical adenopathy.  Neurological: He is alert and oriented to person, place, and time. He has normal sensation, normal strength and intact cranial nerves. No cranial nerve deficit.  Skin: Skin is warm. No rash noted.  Psychiatric: Memory, affect and judgment normal. He exhibits a depressed mood.  Remembered mint/left relieved and smiling  Nursing note and vitals reviewed.     Assessment & Plan  Problem List Items Addressed This Visit    None    Visit Diagnoses    Severe episode of recurrent major depressive disorder, without psychotic features ([place])    -  Primary    Relevant Medications    citalopram (CELEXA) 20 MG tablet         Dr. Micharl [facility]  07/08/2015

## 2015-08-02 ENCOUNTER — Encounter: Payer: Self-pay | Admitting: Family Medicine

## 2015-08-02 ENCOUNTER — Ambulatory Visit (INDEPENDENT_AMBULATORY_CARE_PROVIDER_SITE_OTHER): Payer: Medicare Other | Admitting: Family Medicine

## 2015-08-02 VITALS — BP 120/80 | HR 68 | Ht 76.0 in | Wt 253.0 lb

## 2015-08-02 DIAGNOSIS — F3341 Major depressive disorder, recurrent, in partial remission: Secondary | ICD-10-CM | POA: Diagnosis not present

## 2015-08-02 DIAGNOSIS — Z23 Encounter for immunization: Secondary | ICD-10-CM

## 2015-08-02 MED ORDER — CITALOPRAM HYDROBROMIDE 40 MG PO TABS: 40.0000 mg | ORAL_TABLET | Freq: Every day | ORAL | Status: DC

## 2015-08-02 NOTE — Progress Notes (Signed)
Name: [facility].   MRN: [id number]    DOB: 1945/11/10   Date:08/02/2015       Progress Note  Subjective  Chief Complaint  Chief Complaint  Patient presents with  . Depression    Depression      The patient presents with depression.  This is a new problem.  The current episode started more than 1 month ago.   The onset quality is gradual.   The problem occurs daily.  The problem has been gradually improving since onset.  Associated symptoms include insomnia and sad.  Associated symptoms include no decreased concentration, no fatigue, no helplessness, no hopelessness, not irritable, no restlessness, no decreased interest, no appetite change, no body aches, no myalgias, no headaches, no indigestion and no suicidal ideas.     The symptoms are aggravated by family issues.  Past treatments include SSRIs - Selective serotonin reuptake inhibitors.  Compliance with treatment is good.  Previous treatment provided mild relief.  Risk factors include marital problems.   Past medical history includes anxiety and depression.     Pertinent negatives include no bipolar disorder, no obsessive-compulsive disorder, no post-traumatic stress disorder and no suicide attempts.   No problem-specific assessment & plan notes found for this encounter.   Past Medical History  Diagnosis Date  . Viral warts   . Hypertension   . Arthritis   . Actinic keratosis   . Osteoarthrosis   . Erectile dysfunction   . Degeneration, intervertebral disc, lumbosacral   . Bilirubinuria   . Sinusitis   . Allergic rhinitis   . Eustachian tube dysfunction   . BPH (benign prostatic hyperplasia)   . Sleep apnea     uses CPAP, will bring mask  . Anxiety   . GERD (gastroesophageal reflux disease)     OTC if needed Pepcid  . Fibromyalgia     Past Surgical History  Procedure Laterality Date  . Cataract extraction      right  . Gallbladder surgery    . Hip surgery      left  . Tonsillectomy    . Eye surgery Right    . Cholecystectomy  2005  . Joint replacement      right and left total hips  . 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 Colonoscopy  2014    cleared for 10 yrs- Dr Allen Norris    Family History  Problem Relation Age of Onset  . Breast cancer Mother   . Hypertension Father   . Heart attack Father     Social History   Social History  . Marital Status: Married    Spouse Name: N/A  . Number of Children: N/A  . Years of Education: N/A   Occupational History  . retired    Social History Main Topics  . Smoking status: Former Smoker -- 1.50 packs/day for 20 years    Types: Cigarettes    Quit date: 10/03/2007  . Smokeless tobacco: Never Used  . Alcohol Use: No  . Drug Use: No  . Sexual Activity: Yes   Other Topics Concern  . Not on file   Social History Narrative    Allergies  Allergen Reactions  . Tramadol     Anger issues  . Latex Rash     Review of Systems  Constitutional: Negative for fever, chills, weight loss, malaise/fatigue, appetite  change and fatigue.  HENT: Negative for ear discharge, ear pain and sore throat.   Eyes: Negative for blurred vision.  Respiratory: Negative for cough, sputum production, shortness of breath and wheezing.   Cardiovascular: Negative for chest pain, palpitations and leg swelling.  Gastrointestinal: Negative for heartburn, nausea, abdominal pain, diarrhea, constipation, blood in stool and melena.  Genitourinary: Negative for dysuria, urgency, frequency and hematuria.  Musculoskeletal: Negative for myalgias, back pain, joint pain and neck pain.  Skin: Negative for rash.  Neurological: Negative for dizziness, tingling, sensory change, focal weakness and headaches.  Endo/Heme/Allergies: Negative for environmental allergies and polydipsia. Does not bruise/bleed easily.  Psychiatric/Behavioral:  Positive for depression. Negative for suicidal ideas and decreased concentration. The patient has insomnia. The patient is not nervous/anxious.      Objective  Filed Vitals:   08/02/15 1034  BP: 120/80  Pulse: 68  Height: 6\' 4"  (1.93 m)  Weight: 253 lb (114.76 kg)    Physical Exam  Constitutional: He is oriented to person, place, and time and well-developed, well-nourished, and in no distress. He is not irritable.  HENT:  Head: Normocephalic.  Right Ear: External ear normal.  Left Ear: External ear normal.  Nose: Nose normal.  Mouth/Throat: Oropharynx is clear and moist.  Eyes: Conjunctivae and EOM are normal. Pupils are equal, round, and reactive to light. Right eye exhibits no discharge. Left eye exhibits no discharge. No scleral icterus.  Neck: Normal range of motion. Neck supple. No JVD present. No tracheal deviation present. No thyromegaly present.  Cardiovascular: Normal rate, regular rhythm, normal heart sounds and intact distal pulses.  Exam reveals no gallop and no friction rub.   No murmur heard. Pulmonary/Chest: Breath sounds normal. No respiratory distress. He has no wheezes. He has no rales.  Abdominal: Soft. Bowel sounds are normal. He exhibits no mass. There is no hepatosplenomegaly. There is no tenderness. There is no rebound, no guarding and no CVA tenderness.  Musculoskeletal: Normal range of motion. He exhibits no edema or tenderness.  Lymphadenopathy:    He has no cervical adenopathy.  Neurological: He is alert and oriented to person, place, and time. He has normal sensation, normal strength and intact cranial nerves. No cranial nerve deficit.  Skin: Skin is warm. No rash noted.  Psychiatric: Mood and affect normal.  Nursing note and vitals reviewed.     Assessment & Plan  Problem List Items Addressed This Visit    None    Visit Diagnoses    Recurrent major depressive disorder, in partial remission ([place])    -  Primary    Relevant Medications     citalopram (CELEXA) 40 MG tablet         Dr. Mac[facility]  08/02/2015

## 2015-08-05 ENCOUNTER — Ambulatory Visit: Payer: Medicare Other | Admitting: Family Medicine

## 2015-08-06 DIAGNOSIS — Z01818 Encounter for other preprocedural examination: Secondary | ICD-10-CM | POA: Diagnosis not present

## 2015-08-06 DIAGNOSIS — M1990 Unspecified osteoarthritis, unspecified site: Secondary | ICD-10-CM | POA: Diagnosis not present

## 2015-08-06 DIAGNOSIS — N400 Enlarged prostate without lower urinary tract symptoms: Secondary | ICD-10-CM | POA: Diagnosis not present

## 2015-08-06 DIAGNOSIS — I10 Essential (primary) hypertension: Secondary | ICD-10-CM | POA: Diagnosis not present

## 2015-08-06 DIAGNOSIS — G4733 Obstructive sleep apnea (adult) (pediatric): Secondary | ICD-10-CM | POA: Diagnosis not present

## 2015-08-09 ENCOUNTER — Other Ambulatory Visit: Payer: Self-pay

## 2015-08-18 DIAGNOSIS — J309 Allergic rhinitis, unspecified: Secondary | ICD-10-CM | POA: Diagnosis not present

## 2015-08-18 DIAGNOSIS — Z9981 Dependence on supplemental oxygen: Secondary | ICD-10-CM | POA: Diagnosis not present

## 2015-08-18 DIAGNOSIS — H2512 Age-related nuclear cataract, left eye: Secondary | ICD-10-CM | POA: Diagnosis not present

## 2015-08-18 DIAGNOSIS — M170 Bilateral primary osteoarthritis of knee: Secondary | ICD-10-CM | POA: Diagnosis not present

## 2015-08-18 DIAGNOSIS — Z96643 Presence of artificial hip joint, bilateral: Secondary | ICD-10-CM | POA: Diagnosis not present

## 2015-08-18 DIAGNOSIS — Z961 Presence of intraocular lens: Secondary | ICD-10-CM | POA: Diagnosis not present

## 2015-08-18 DIAGNOSIS — Z9841 Cataract extraction status, right eye: Secondary | ICD-10-CM | POA: Diagnosis not present

## 2015-08-18 DIAGNOSIS — G4733 Obstructive sleep apnea (adult) (pediatric): Secondary | ICD-10-CM | POA: Diagnosis not present

## 2015-08-18 DIAGNOSIS — I10 Essential (primary) hypertension: Secondary | ICD-10-CM | POA: Diagnosis not present

## 2015-08-18 DIAGNOSIS — Z96612 Presence of left artificial shoulder joint: Secondary | ICD-10-CM | POA: Diagnosis not present

## 2015-08-18 DIAGNOSIS — H25042 Posterior subcapsular polar age-related cataract, left eye: Secondary | ICD-10-CM | POA: Diagnosis not present

## 2015-09-07 ENCOUNTER — Encounter: Payer: Self-pay | Admitting: Family Medicine

## 2015-09-07 ENCOUNTER — Ambulatory Visit (INDEPENDENT_AMBULATORY_CARE_PROVIDER_SITE_OTHER): Payer: Medicare Other | Admitting: Family Medicine

## 2015-09-07 VITALS — BP 112/64 | HR 78 | Ht 76.0 in | Wt 248.0 lb

## 2015-09-07 DIAGNOSIS — J0100 Acute maxillary sinusitis, unspecified: Secondary | ICD-10-CM

## 2015-09-07 DIAGNOSIS — J40 Bronchitis, not specified as acute or chronic: Secondary | ICD-10-CM | POA: Diagnosis not present

## 2015-09-07 DIAGNOSIS — D229 Melanocytic nevi, unspecified: Secondary | ICD-10-CM | POA: Diagnosis not present

## 2015-09-07 MED ORDER — GUAIFENESIN-CODEINE 100-10 MG/5ML PO SOLN: 5.0000 mL | Freq: Three times a day (TID) | ORAL | Status: DC | PRN

## 2015-09-07 MED ORDER — AMOXICILLIN 500 MG PO CAPS: 500.0000 mg | ORAL_CAPSULE | Freq: Three times a day (TID) | ORAL | Status: DC

## 2015-09-07 NOTE — Progress Notes (Signed)
Name: [facility].   MRN: [id number]    DOB: Mar 30, 1946   Date:09/07/2015       Progress Note  Subjective  Chief Complaint  Chief Complaint  Patient presents with  . Sinusitis    cough with green production  . Nevus    on belly- "brushed with a towel and it bled"    Sinusitis This is a chronic problem. The current episode started 1 to 4 weeks ago. The problem has been waxing and waning since onset. There has been no fever. He is experiencing no pain. Associated symptoms include chills, congestion, coughing, headaches, a hoarse voice, sinus pressure and a sore throat. Pertinent negatives include no diaphoresis, ear pain, neck pain, shortness of breath, sneezing or swollen glands. Treatments tried: ibuprofen/ loratadine. The treatment provided mild relief.  Cough This is a chronic problem. The current episode started in the past 7 days. The problem has been waxing and waning. The cough is productive of sputum (green). Associated symptoms include chills, headaches, a rash and a sore throat. Pertinent negatives include no chest pain, ear pain, fever, heartburn, hemoptysis, myalgias, shortness of breath, weight loss or wheezing. The symptoms are aggravated by cold air. He has tried nothing for the symptoms. There is no history of environmental allergies.  Rash This is a new (mole chane) problem. The current episode started 1 to 4 weeks ago. The problem has been gradually worsening since onset. The affected locations include the abdomen. The rash is characterized by swelling (color change/ ? bleed). Associated symptoms include congestion, coughing and a sore throat. Pertinent negatives include no diarrhea, fever, joint pain or shortness of breath. Past treatments include nothing.    No problem-specific assessment & plan notes found for this encounter.   Past Medical History  Diagnosis Date  . Viral warts   . Hypertension   . Arthritis   . Actinic keratosis   . Osteoarthrosis   .  Erectile dysfunction   . Degeneration, intervertebral disc, lumbosacral   . Bilirubinuria   . Sinusitis   . Allergic rhinitis   . Eustachian tube dysfunction   . BPH (benign prostatic hyperplasia)   . Sleep apnea     uses CPAP, will bring mask  . Anxiety   . GERD (gastroesophageal reflux disease)     OTC if needed Pepcid  . Fibromyalgia     Past Surgical History  Procedure Laterality Date  . Cataract extraction      right  . Gallbladder surgery    . Hip surgery      left  . Tonsillectomy    . Eye surgery Right   . Cholecystectomy  2005  . Joint replacement      right and left total hips  . Total shoulder arthroplasty Left 09/03/2014    Procedure: TOTAL SHOULDER ARTHROPLASTY;  Surgeon: Nita Sells, MD;  Location: [place];  Service: Orthopedics;  Laterality: Left;  . Colonoscopy  2014    cleared for 10 yrs- Dr Allen Norris    Family History  Problem Relation Age of Onset  . Breast cancer Mother   . Hypertension Father   . Heart attack Father     Social History   Social History  . Marital Status: Married    Spouse Name: N/A  . Number of Children: N/A  . Years of Education: N/A   Occupational History  . retired    Social History Main Topics  . Smoking status: Former Smoker -- 1.50 packs/day for 20  years    Types: Cigarettes    Quit date: 10/03/2007  . Smokeless tobacco: Never Used  . Alcohol Use: No  . Drug Use: No  . Sexual Activity: Yes   Other Topics Concern  . Not on file   Social History Narrative    Allergies  Allergen Reactions  . Tramadol     Anger issues  . Latex Rash     Review of Systems  Constitutional: Positive for chills. Negative for fever, weight loss, malaise/fatigue and diaphoresis.  HENT: Positive for congestion, hoarse voice, sinus pressure and sore throat. Negative for ear discharge, ear pain and sneezing.   Eyes: Negative for blurred vision.  Respiratory: Positive for cough and sputum production. Negative for hemoptysis,  shortness of breath and wheezing.   Cardiovascular: Negative for chest pain, palpitations and leg swelling.  Gastrointestinal: Negative for heartburn, nausea, abdominal pain, diarrhea, constipation, blood in stool and melena.  Genitourinary: Negative for dysuria, urgency, frequency and hematuria.  Musculoskeletal: Negative for myalgias, back pain, joint pain and neck pain.  Skin: Positive for rash.  Neurological: Positive for headaches. Negative for dizziness, tingling, sensory change and focal weakness.  Endo/Heme/Allergies: Negative for environmental allergies and polydipsia. Does not bruise/bleed easily.  Psychiatric/Behavioral: Negative for depression and suicidal ideas. The patient is not nervous/anxious and does not have insomnia.      Objective  Filed Vitals:   09/07/15 1045  BP: 112/64  Pulse: 78  Height: 6\' 4"  (1.93 m)  Weight: 248 lb (112.492 kg)    Physical Exam  Constitutional: He is oriented to person, place, and time and well-developed, well-nourished, and in no distress.  HENT:  Head: Normocephalic.  Right Ear: External ear normal.  Left Ear: External ear normal.  Nose: Nose normal.  Mouth/Throat: Oropharynx is clear and moist.  Eyes: Conjunctivae and EOM are normal. Pupils are equal, round, and reactive to light. Right eye exhibits no discharge. Left eye exhibits no discharge. No scleral icterus.  Neck: Normal range of motion. Neck supple. No JVD present. No tracheal deviation present. No thyromegaly present.  Cardiovascular: Normal rate, regular rhythm, normal heart sounds and intact distal pulses.  Exam reveals no gallop and no friction rub.   No murmur heard. Pulmonary/Chest: Breath sounds normal. No respiratory distress. He has no wheezes. He has no rales. He exhibits no tenderness.  Abdominal: Soft. Bowel sounds are normal. He exhibits no mass. There is no hepatosplenomegaly. There is no tenderness. There is no rebound, no guarding and no CVA tenderness.    Musculoskeletal: Normal range of motion. He exhibits no edema or tenderness.  Lymphadenopathy:    He has no cervical adenopathy.  Neurological: He is alert and oriented to person, place, and time. He has normal sensation, normal strength, normal reflexes and intact cranial nerves. No cranial nerve deficit.  Skin: Skin is warm. Ecchymosis and lesion noted. No rash noted.     Papular dark red nontender  Psychiatric: Mood and affect normal.  Nursing note and vitals reviewed.     Assessment & Plan  Problem List Items Addressed This Visit    None    Visit Diagnoses    Acute maxillary sinusitis, recurrence not specified    -  Primary    Relevant Medications    amoxicillin (AMOXIL) 500 MG capsule    guaiFENesin-codeine 100-10 MG/5ML syrup    Bronchitis        can take codeine    Relevant Medications    guaiFENesin-codeine 100-10 MG/5ML syrup  Bleeding nevus        Relevant Orders    Ambulatory referral to Dermatology         Dr. Otilio Miu [facility]  09/07/2015

## 2015-10-06 DIAGNOSIS — L821 Other seborrheic keratosis: Secondary | ICD-10-CM | POA: Diagnosis not present

## 2015-10-06 DIAGNOSIS — D692 Other nonthrombocytopenic purpura: Secondary | ICD-10-CM | POA: Diagnosis not present

## 2015-12-23 ENCOUNTER — Other Ambulatory Visit: Payer: Self-pay

## 2015-12-23 MED ORDER — CITALOPRAM HYDROBROMIDE 20 MG PO TABS: 20.0000 mg | ORAL_TABLET | Freq: Every day | ORAL | Status: DC

## 2016-01-21 DIAGNOSIS — M25532 Pain in left wrist: Secondary | ICD-10-CM | POA: Diagnosis not present

## 2016-01-21 DIAGNOSIS — M25512 Pain in left shoulder: Secondary | ICD-10-CM | POA: Diagnosis not present

## 2016-03-03 DIAGNOSIS — M542 Cervicalgia: Secondary | ICD-10-CM | POA: Diagnosis not present

## 2016-03-03 DIAGNOSIS — M9902 Segmental and somatic dysfunction of thoracic region: Secondary | ICD-10-CM | POA: Diagnosis not present

## 2016-03-03 DIAGNOSIS — M546 Pain in thoracic spine: Secondary | ICD-10-CM | POA: Diagnosis not present

## 2016-03-03 DIAGNOSIS — M9901 Segmental and somatic dysfunction of cervical region: Secondary | ICD-10-CM | POA: Diagnosis not present

## 2016-04-14 DIAGNOSIS — I8311 Varicose veins of right lower extremity with inflammation: Secondary | ICD-10-CM | POA: Diagnosis not present

## 2016-04-14 DIAGNOSIS — L570 Actinic keratosis: Secondary | ICD-10-CM | POA: Diagnosis not present

## 2016-04-14 DIAGNOSIS — L980 Pyogenic granuloma: Secondary | ICD-10-CM | POA: Diagnosis not present

## 2016-04-14 DIAGNOSIS — D485 Neoplasm of uncertain behavior of skin: Secondary | ICD-10-CM | POA: Diagnosis not present

## 2016-04-14 DIAGNOSIS — L0202 Furuncle of face: Secondary | ICD-10-CM | POA: Diagnosis not present

## 2016-04-14 DIAGNOSIS — L718 Other rosacea: Secondary | ICD-10-CM | POA: Diagnosis not present

## 2016-04-14 DIAGNOSIS — Z1283 Encounter for screening for malignant neoplasm of skin: Secondary | ICD-10-CM | POA: Diagnosis not present

## 2016-04-14 DIAGNOSIS — L400 Psoriasis vulgaris: Secondary | ICD-10-CM | POA: Diagnosis not present

## 2016-04-14 DIAGNOSIS — L820 Inflamed seborrheic keratosis: Secondary | ICD-10-CM | POA: Diagnosis not present

## 2016-04-14 DIAGNOSIS — I8312 Varicose veins of left lower extremity with inflammation: Secondary | ICD-10-CM | POA: Diagnosis not present

## 2016-05-02 DIAGNOSIS — M5417 Radiculopathy, lumbosacral region: Secondary | ICD-10-CM | POA: Diagnosis not present

## 2016-05-02 DIAGNOSIS — M531 Cervicobrachial syndrome: Secondary | ICD-10-CM | POA: Diagnosis not present

## 2016-05-02 DIAGNOSIS — M9903 Segmental and somatic dysfunction of lumbar region: Secondary | ICD-10-CM | POA: Diagnosis not present

## 2016-05-02 DIAGNOSIS — M9901 Segmental and somatic dysfunction of cervical region: Secondary | ICD-10-CM | POA: Diagnosis not present

## 2016-05-09 DIAGNOSIS — M9901 Segmental and somatic dysfunction of cervical region: Secondary | ICD-10-CM | POA: Diagnosis not present

## 2016-05-09 DIAGNOSIS — M9903 Segmental and somatic dysfunction of lumbar region: Secondary | ICD-10-CM | POA: Diagnosis not present

## 2016-05-09 DIAGNOSIS — M5417 Radiculopathy, lumbosacral region: Secondary | ICD-10-CM | POA: Diagnosis not present

## 2016-05-09 DIAGNOSIS — M531 Cervicobrachial syndrome: Secondary | ICD-10-CM | POA: Diagnosis not present

## 2016-05-16 DIAGNOSIS — M531 Cervicobrachial syndrome: Secondary | ICD-10-CM | POA: Diagnosis not present

## 2016-05-16 DIAGNOSIS — M5417 Radiculopathy, lumbosacral region: Secondary | ICD-10-CM | POA: Diagnosis not present

## 2016-05-16 DIAGNOSIS — M9903 Segmental and somatic dysfunction of lumbar region: Secondary | ICD-10-CM | POA: Diagnosis not present

## 2016-05-16 DIAGNOSIS — M9901 Segmental and somatic dysfunction of cervical region: Secondary | ICD-10-CM | POA: Diagnosis not present

## 2016-05-23 DIAGNOSIS — M9903 Segmental and somatic dysfunction of lumbar region: Secondary | ICD-10-CM | POA: Diagnosis not present

## 2016-05-23 DIAGNOSIS — M9901 Segmental and somatic dysfunction of cervical region: Secondary | ICD-10-CM | POA: Diagnosis not present

## 2016-05-23 DIAGNOSIS — M531 Cervicobrachial syndrome: Secondary | ICD-10-CM | POA: Diagnosis not present

## 2016-05-23 DIAGNOSIS — M5417 Radiculopathy, lumbosacral region: Secondary | ICD-10-CM | POA: Diagnosis not present

## 2016-06-06 DIAGNOSIS — M5417 Radiculopathy, lumbosacral region: Secondary | ICD-10-CM | POA: Diagnosis not present

## 2016-06-06 DIAGNOSIS — M9901 Segmental and somatic dysfunction of cervical region: Secondary | ICD-10-CM | POA: Diagnosis not present

## 2016-06-06 DIAGNOSIS — M9903 Segmental and somatic dysfunction of lumbar region: Secondary | ICD-10-CM | POA: Diagnosis not present

## 2016-06-06 DIAGNOSIS — M531 Cervicobrachial syndrome: Secondary | ICD-10-CM | POA: Diagnosis not present

## 2016-06-15 DIAGNOSIS — N401 Enlarged prostate with lower urinary tract symptoms: Secondary | ICD-10-CM | POA: Diagnosis not present

## 2016-06-15 DIAGNOSIS — N138 Other obstructive and reflux uropathy: Secondary | ICD-10-CM | POA: Diagnosis not present

## 2016-06-15 DIAGNOSIS — N529 Male erectile dysfunction, unspecified: Secondary | ICD-10-CM | POA: Diagnosis not present

## 2016-06-27 DIAGNOSIS — M9903 Segmental and somatic dysfunction of lumbar region: Secondary | ICD-10-CM | POA: Diagnosis not present

## 2016-06-27 DIAGNOSIS — M5417 Radiculopathy, lumbosacral region: Secondary | ICD-10-CM | POA: Diagnosis not present

## 2016-06-27 DIAGNOSIS — M531 Cervicobrachial syndrome: Secondary | ICD-10-CM | POA: Diagnosis not present

## 2016-06-27 DIAGNOSIS — M9901 Segmental and somatic dysfunction of cervical region: Secondary | ICD-10-CM | POA: Diagnosis not present

## 2016-07-13 ENCOUNTER — Ambulatory Visit: Payer: Medicare Other | Admitting: Family Medicine

## 2016-07-14 ENCOUNTER — Encounter: Payer: Self-pay | Admitting: Family Medicine

## 2016-07-14 ENCOUNTER — Ambulatory Visit (INDEPENDENT_AMBULATORY_CARE_PROVIDER_SITE_OTHER): Payer: Medicare Other | Admitting: Family Medicine

## 2016-07-14 VITALS — BP 140/78 | HR 88 | Ht 76.0 in | Wt 254.0 lb

## 2016-07-14 DIAGNOSIS — B079 Viral wart, unspecified: Secondary | ICD-10-CM

## 2016-07-14 DIAGNOSIS — F419 Anxiety disorder, unspecified: Secondary | ICD-10-CM | POA: Diagnosis not present

## 2016-07-14 DIAGNOSIS — N342 Other urethritis: Secondary | ICD-10-CM | POA: Diagnosis not present

## 2016-07-14 MED ORDER — CITALOPRAM HYDROBROMIDE 20 MG PO TABS: 20.0000 mg | ORAL_TABLET | Freq: Every day | ORAL | 6 refills | Status: DC

## 2016-07-14 MED ORDER — DOXYCYCLINE HYCLATE 100 MG PO TABS: 100.0000 mg | ORAL_TABLET | Freq: Two times a day (BID) | ORAL | 0 refills | Status: DC

## 2016-07-14 NOTE — Progress Notes (Signed)
Name: [facility].   MRN: [id number]    DOB: Apr 12, 1946   Date:07/14/2016       Progress Note  Subjective  Chief Complaint  Chief Complaint  Patient presents with  . Urinary Tract Infection    burning in penis when using the bathroom and sometimes when "not going"    Urinary Tract Infection   This is a new problem. The current episode started in the past 7 days. The problem occurs every urination. The problem has been waxing and waning. The quality of the pain is described as burning. The pain is moderate. There has been no fever. Associated symptoms include frequency and urgency. Pertinent negatives include no chills, discharge, flank pain, hematuria, hesitancy, nausea, sweats or vomiting. He has tried nothing for the symptoms. There is no history of catheterization, kidney stones, recurrent UTIs or urinary stasis.  Depression       The patient presents with depression.  This is a chronic problem.  The current episode started more than 1 year ago.   The onset quality is gradual.   The problem occurs daily.  The problem has been gradually improving since onset.  Associated symptoms include no decreased concentration, no fatigue, no helplessness, no hopelessness, does not have insomnia, not irritable, no restlessness, no decreased interest, no appetite change, no body aches, no myalgias, no headaches, no indigestion, not sad and no suicidal ideas.     The symptoms are aggravated by nothing.  Past treatments include SSRIs - Selective serotonin reuptake inhibitors.  Compliance with treatment is good.  Previous treatment provided mild relief.  Past medical history includes anxiety and depression.     Pertinent negatives include no chronic fatigue syndrome. Anxiety  Presents for follow-up visit. Symptoms include nervous/anxious behavior. Patient reports no chest pain, decreased concentration, dizziness, insomnia, nausea, palpitations, panic, restlessness, shortness of breath or suicidal ideas.  The severity of symptoms is mild.   His past medical history is significant for depression.    No problem-specific Assessment & Plan notes found for this encounter.   Past Medical History:  Diagnosis Date  . Actinic keratosis   . Allergic rhinitis   . Anxiety   . Arthritis   . Bilirubinuria   . BPH (benign prostatic hyperplasia)   . Degeneration, intervertebral disc, lumbosacral   . Erectile dysfunction   . Eustachian tube dysfunction   . Fibromyalgia   . GERD (gastroesophageal reflux disease)    OTC if needed Pepcid  . Hypertension   . Osteoarthrosis   . Sinusitis   . Sleep apnea    uses CPAP, will bring mask  . Viral warts     Past Surgical History:  Procedure Laterality Date  . CATARACT EXTRACTION     right  . CHOLECYSTECTOMY  2005  . COLONOSCOPY  2014   cleared for 10 yrs- Dr Allen Norris  . EYE SURGERY Right   . GALLBLADDER SURGERY    . HIP SURGERY     left  . JOINT REPLACEMENT     right and left total hips  . TONSILLECTOMY    . TOTAL SHOULDER ARTHROPLASTY Left 09/03/2014   Procedure: TOTAL SHOULDER ARTHROPLASTY;  Surgeon: Nita Sells, MD;  Location: [place];  Service: Orthopedics;  Laterality: Left;    Family History  Problem Relation Age of Onset  . Breast cancer Mother   . Hypertension Father   . Heart attack Father     Social History   Social History  . Marital status: Married  Spouse name: N/A  . Number of children: N/A  . Years of education: N/A   Occupational History  . retired    Social History Main Topics  . Smoking status: Former Smoker    Packs/day: 1.50    Years: 20.00    Types: Cigarettes    Quit date: 10/03/2007  . Smokeless tobacco: Never Used  . Alcohol use No  . Drug use: No  . Sexual activity: Yes   Other Topics Concern  . Not on file   Social History Narrative  . No narrative on file    Allergies  Allergen Reactions  . Tramadol     Anger issues  . Latex Rash     Review of Systems  Constitutional:  Negative for appetite change, chills, fatigue, fever, malaise/fatigue and weight loss.  HENT: Negative for ear discharge, ear pain and sore throat.   Eyes: Negative for blurred vision.  Respiratory: Negative for cough, sputum production, shortness of breath and wheezing.   Cardiovascular: Negative for chest pain, palpitations and leg swelling.  Gastrointestinal: Negative for abdominal pain, blood in stool, constipation, diarrhea, heartburn, melena, nausea and vomiting.  Genitourinary: Positive for frequency and urgency. Negative for dysuria, flank pain, hematuria and hesitancy.  Musculoskeletal: Negative for back pain, joint pain, myalgias and neck pain.  Skin: Negative for rash.  Neurological: Negative for dizziness, tingling, sensory change, focal weakness and headaches.  Endo/Heme/Allergies: Negative for environmental allergies and polydipsia. Does not bruise/bleed easily.  Psychiatric/Behavioral: Positive for depression. Negative for decreased concentration and suicidal ideas. The patient is nervous/anxious. The patient does not have insomnia.      Objective  Vitals:   07/14/16 1350  BP: 140/78  Pulse: 88  Weight: 254 lb (115.2 kg)  Height: 6\' 4"  (1.93 m)    Physical Exam  Constitutional: He is oriented to person, place, and time and well-developed, well-nourished, and in no distress. He is not irritable.  HENT:  Head: Normocephalic.  Right Ear: External ear normal.  Left Ear: External ear normal.  Nose: Nose normal.  Mouth/Throat: Oropharynx is clear and moist.  Eyes: Conjunctivae and EOM are normal. Pupils are equal, round, and reactive to light. Right eye exhibits no discharge. Left eye exhibits no discharge. No scleral icterus.  Neck: Normal range of motion. Neck supple. No JVD present. No tracheal deviation present. No thyromegaly present.  Cardiovascular: Normal rate, regular rhythm, normal heart sounds and intact distal pulses.  Exam reveals no gallop and no friction  rub.   No murmur heard. Pulmonary/Chest: Breath sounds normal. No respiratory distress. He has no wheezes. He has no rales.  Abdominal: Soft. Bowel sounds are normal. He exhibits no mass. There is no hepatosplenomegaly. There is no tenderness. There is no rebound, no guarding and no CVA tenderness.  Musculoskeletal: Normal range of motion. He exhibits no edema or tenderness.  Lymphadenopathy:    He has no cervical adenopathy.  Neurological: He is alert and oriented to person, place, and time. He has normal sensation, normal strength, normal reflexes and intact cranial nerves. No cranial nerve deficit.  Skin: Skin is warm. Lesion and rash noted.  penile  Psychiatric: Mood and affect normal.  Nursing note and vitals reviewed.     Assessment & Plan  Problem List Items Addressed This Visit    None    Visit Diagnoses    Urethritis    -  Primary   Viral warts, unspecified type       n2 applied   Chronic anxiety  Relevant Medications   citalopram (CELEXA) 20 MG tablet        Dr. Otilio Miu [facility]  07/14/16

## 2016-07-18 DIAGNOSIS — M9903 Segmental and somatic dysfunction of lumbar region: Secondary | ICD-10-CM | POA: Diagnosis not present

## 2016-07-18 DIAGNOSIS — M9901 Segmental and somatic dysfunction of cervical region: Secondary | ICD-10-CM | POA: Diagnosis not present

## 2016-07-18 DIAGNOSIS — M531 Cervicobrachial syndrome: Secondary | ICD-10-CM | POA: Diagnosis not present

## 2016-07-18 DIAGNOSIS — M5417 Radiculopathy, lumbosacral region: Secondary | ICD-10-CM | POA: Diagnosis not present

## 2016-07-25 DIAGNOSIS — M9901 Segmental and somatic dysfunction of cervical region: Secondary | ICD-10-CM | POA: Diagnosis not present

## 2016-07-25 DIAGNOSIS — M9903 Segmental and somatic dysfunction of lumbar region: Secondary | ICD-10-CM | POA: Diagnosis not present

## 2016-07-25 DIAGNOSIS — M531 Cervicobrachial syndrome: Secondary | ICD-10-CM | POA: Diagnosis not present

## 2016-07-25 DIAGNOSIS — M5417 Radiculopathy, lumbosacral region: Secondary | ICD-10-CM | POA: Diagnosis not present

## 2016-08-02 DIAGNOSIS — Z23 Encounter for immunization: Secondary | ICD-10-CM | POA: Diagnosis not present

## 2016-08-03 ENCOUNTER — Other Ambulatory Visit: Payer: Self-pay

## 2016-08-08 DIAGNOSIS — M531 Cervicobrachial syndrome: Secondary | ICD-10-CM | POA: Diagnosis not present

## 2016-08-08 DIAGNOSIS — M5417 Radiculopathy, lumbosacral region: Secondary | ICD-10-CM | POA: Diagnosis not present

## 2016-08-08 DIAGNOSIS — M9903 Segmental and somatic dysfunction of lumbar region: Secondary | ICD-10-CM | POA: Diagnosis not present

## 2016-08-08 DIAGNOSIS — M9901 Segmental and somatic dysfunction of cervical region: Secondary | ICD-10-CM | POA: Diagnosis not present

## 2016-08-22 DIAGNOSIS — M5417 Radiculopathy, lumbosacral region: Secondary | ICD-10-CM | POA: Diagnosis not present

## 2016-08-22 DIAGNOSIS — M9901 Segmental and somatic dysfunction of cervical region: Secondary | ICD-10-CM | POA: Diagnosis not present

## 2016-08-22 DIAGNOSIS — M9903 Segmental and somatic dysfunction of lumbar region: Secondary | ICD-10-CM | POA: Diagnosis not present

## 2016-08-22 DIAGNOSIS — M531 Cervicobrachial syndrome: Secondary | ICD-10-CM | POA: Diagnosis not present

## 2016-09-05 DIAGNOSIS — M531 Cervicobrachial syndrome: Secondary | ICD-10-CM | POA: Diagnosis not present

## 2016-09-05 DIAGNOSIS — M5417 Radiculopathy, lumbosacral region: Secondary | ICD-10-CM | POA: Diagnosis not present

## 2016-09-05 DIAGNOSIS — M9903 Segmental and somatic dysfunction of lumbar region: Secondary | ICD-10-CM | POA: Diagnosis not present

## 2016-09-05 DIAGNOSIS — M9901 Segmental and somatic dysfunction of cervical region: Secondary | ICD-10-CM | POA: Diagnosis not present

## 2016-09-19 DIAGNOSIS — Z961 Presence of intraocular lens: Secondary | ICD-10-CM | POA: Diagnosis not present

## 2016-09-19 DIAGNOSIS — Z9842 Cataract extraction status, left eye: Secondary | ICD-10-CM | POA: Diagnosis not present

## 2016-09-19 DIAGNOSIS — Z9841 Cataract extraction status, right eye: Secondary | ICD-10-CM | POA: Diagnosis not present

## 2016-09-19 DIAGNOSIS — H26493 Other secondary cataract, bilateral: Secondary | ICD-10-CM | POA: Diagnosis not present

## 2016-10-12 DIAGNOSIS — M9901 Segmental and somatic dysfunction of cervical region: Secondary | ICD-10-CM | POA: Diagnosis not present

## 2016-10-12 DIAGNOSIS — M5417 Radiculopathy, lumbosacral region: Secondary | ICD-10-CM | POA: Diagnosis not present

## 2016-10-12 DIAGNOSIS — M9903 Segmental and somatic dysfunction of lumbar region: Secondary | ICD-10-CM | POA: Diagnosis not present

## 2016-10-12 DIAGNOSIS — M531 Cervicobrachial syndrome: Secondary | ICD-10-CM | POA: Diagnosis not present

## 2016-10-23 DIAGNOSIS — M5417 Radiculopathy, lumbosacral region: Secondary | ICD-10-CM | POA: Diagnosis not present

## 2016-10-23 DIAGNOSIS — M531 Cervicobrachial syndrome: Secondary | ICD-10-CM | POA: Diagnosis not present

## 2016-10-23 DIAGNOSIS — M9903 Segmental and somatic dysfunction of lumbar region: Secondary | ICD-10-CM | POA: Diagnosis not present

## 2016-10-23 DIAGNOSIS — M9901 Segmental and somatic dysfunction of cervical region: Secondary | ICD-10-CM | POA: Diagnosis not present

## 2016-11-08 DIAGNOSIS — M531 Cervicobrachial syndrome: Secondary | ICD-10-CM | POA: Diagnosis not present

## 2016-11-08 DIAGNOSIS — M5417 Radiculopathy, lumbosacral region: Secondary | ICD-10-CM | POA: Diagnosis not present

## 2016-11-08 DIAGNOSIS — M9903 Segmental and somatic dysfunction of lumbar region: Secondary | ICD-10-CM | POA: Diagnosis not present

## 2016-11-08 DIAGNOSIS — M9901 Segmental and somatic dysfunction of cervical region: Secondary | ICD-10-CM | POA: Diagnosis not present

## 2016-11-23 DIAGNOSIS — Z8659 Personal history of other mental and behavioral disorders: Secondary | ICD-10-CM | POA: Diagnosis not present

## 2016-11-23 DIAGNOSIS — R011 Cardiac murmur, unspecified: Secondary | ICD-10-CM | POA: Diagnosis not present

## 2016-11-23 DIAGNOSIS — M1990 Unspecified osteoarthritis, unspecified site: Secondary | ICD-10-CM | POA: Diagnosis not present

## 2016-11-23 DIAGNOSIS — G4733 Obstructive sleep apnea (adult) (pediatric): Secondary | ICD-10-CM | POA: Diagnosis not present

## 2016-11-23 DIAGNOSIS — E6609 Other obesity due to excess calories: Secondary | ICD-10-CM | POA: Diagnosis not present

## 2016-11-23 DIAGNOSIS — Z6832 Body mass index (BMI) 32.0-32.9, adult: Secondary | ICD-10-CM | POA: Diagnosis not present

## 2016-11-23 DIAGNOSIS — I10 Essential (primary) hypertension: Secondary | ICD-10-CM | POA: Diagnosis not present

## 2016-11-23 DIAGNOSIS — R9431 Abnormal electrocardiogram [ECG] [EKG]: Secondary | ICD-10-CM | POA: Diagnosis not present

## 2016-11-29 ENCOUNTER — Ambulatory Visit (INDEPENDENT_AMBULATORY_CARE_PROVIDER_SITE_OTHER): Payer: Medicare Other | Admitting: Family Medicine

## 2016-11-29 ENCOUNTER — Encounter: Payer: Self-pay | Admitting: Family Medicine

## 2016-11-29 VITALS — BP 130/80 | HR 64 | Ht 76.0 in | Wt 260.0 lb

## 2016-11-29 DIAGNOSIS — J0100 Acute maxillary sinusitis, unspecified: Secondary | ICD-10-CM | POA: Diagnosis not present

## 2016-11-29 DIAGNOSIS — T7840XA Allergy, unspecified, initial encounter: Secondary | ICD-10-CM | POA: Diagnosis not present

## 2016-11-29 MED ORDER — AMOXICILLIN 500 MG PO CAPS: 500.0000 mg | ORAL_CAPSULE | Freq: Three times a day (TID) | ORAL | 1 refills | Status: DC

## 2016-11-29 NOTE — Progress Notes (Signed)
Name: [facility].   MRN: [id number]    DOB: 1946/05/17   Date:11/29/2016       Progress Note  Subjective  Chief Complaint  Chief Complaint  Patient presents with  . Sinusitis    better today than he has been for the past 2 days- has alot of cong, stuffiness, cough with thick/ green production    Sinusitis  This is a new problem. The current episode started in the past 7 days. The problem has been gradually worsening since onset. There has been no fever. The pain is mild. Associated symptoms include congestion, coughing, headaches, a hoarse voice, sinus pressure and a sore throat. Pertinent negatives include no chills, diaphoresis, ear pain, neck pain or shortness of breath. (Prod nasal drainage) Past treatments include acetaminophen. The treatment provided mild relief.    No problem-specific Assessment & Plan notes found for this encounter.   Past Medical History:  Diagnosis Date  . Actinic keratosis   . Allergic rhinitis   . Anxiety   . Arthritis   . Bilirubinuria   . BPH (benign prostatic hyperplasia)   . Degeneration, intervertebral disc, lumbosacral   . Erectile dysfunction   . Eustachian tube dysfunction   . Fibromyalgia   . GERD (gastroesophageal reflux disease)    OTC if needed Pepcid  . Hypertension   . Osteoarthrosis   . Sinusitis   . Sleep apnea    uses CPAP, will bring mask  . Viral warts     Past Surgical History:  Procedure Laterality Date  . CATARACT EXTRACTION     right  . CHOLECYSTECTOMY  2005  . COLONOSCOPY  2014   cleared for 10 yrs- Dr Allen Norris  . EYE SURGERY Right   . GALLBLADDER SURGERY    . HIP SURGERY     left  . JOINT REPLACEMENT     right and left total hips  . TONSILLECTOMY    . TOTAL SHOULDER ARTHROPLASTY Left 09/03/2014   Procedure: TOTAL SHOULDER ARTHROPLASTY;  Surgeon: Nita Sells, MD;  Location: [place];  Service: Orthopedics;  Laterality: Left;    Family History  Problem Relation Age of Onset  . Breast cancer  Mother   . Hypertension Father   . Heart attack Father     Social History   Social History  . Marital status: Married    Spouse name: N/A  . Number of children: N/A  . Years of education: N/A   Occupational History  . retired    Social History Main Topics  . Smoking status: Former Smoker    Packs/day: 1.50    Years: 20.00    Types: Cigarettes    Quit date: 10/03/2007  . Smokeless tobacco: Never Used  . Alcohol use No  . Drug use: No  . Sexual activity: Yes   Other Topics Concern  . Not on file   Social History Narrative  . No narrative on file    Allergies  Allergen Reactions  . Tramadol     Anger issues  . Latex Rash    Outpatient Medications Prior to Visit  Medication Sig Dispense Refill  . amLODipine (NORVASC) 5 MG tablet Take 10 mg by mouth daily. Dr Clayborn Bigness    . citalopram (CELEXA) 20 MG tablet Take 1 tablet (20 mg total) by mouth daily. 30 tablet 6  . labetalol (NORMODYNE) 100 MG tablet Take 100 mg by mouth daily. Dr Clayborn Bigness    . losartan (COZAAR) 100 MG tablet Take 100 mg  by mouth daily. Dr Clayborn Bigness    . tamsulosin (FLOMAX) 0.4 MG CAPS capsule Take 0.4 mg by mouth daily. Dr Bernardo Heater    . doxycycline (VIBRA-TABS) 100 MG tablet Take 1 tablet (100 mg total) by mouth 2 (two) times daily. 14 tablet 0   No facility-administered medications prior to visit.     Review of Systems  Constitutional: Negative for chills, diaphoresis, fever, malaise/fatigue and weight loss.  HENT: Positive for congestion, hoarse voice, sinus pressure and sore throat. Negative for ear discharge and ear pain.   Eyes: Negative for blurred vision.  Respiratory: Positive for cough. Negative for sputum production, shortness of breath and wheezing.   Cardiovascular: Negative for chest pain, palpitations and leg swelling.  Gastrointestinal: Negative for abdominal pain, blood in stool, constipation, diarrhea, heartburn, melena and nausea.  Genitourinary: Negative for dysuria, frequency,  hematuria and urgency.  Musculoskeletal: Negative for back pain, joint pain, myalgias and neck pain.  Skin: Negative for rash.  Neurological: Positive for headaches. Negative for dizziness, tingling, sensory change and focal weakness.  Endo/Heme/Allergies: Negative for environmental allergies and polydipsia. Does not bruise/bleed easily.  Psychiatric/Behavioral: Negative for depression and suicidal ideas. The patient is not nervous/anxious and does not have insomnia.      Objective  Vitals:   11/29/16 1038  BP: 130/80  Pulse: 64  Weight: 260 lb (117.9 kg)  Height: 6\' 4"  (1.93 m)    Physical Exam  Constitutional: He is oriented to person, place, and time and well-developed, well-nourished, and in no distress.  HENT:  Head: Normocephalic.  Right Ear: External ear normal.  Left Ear: External ear normal.  Nose: Nose normal.  Mouth/Throat: Posterior oropharyngeal erythema present.  Postnasal drainage  Eyes: Conjunctivae and EOM are normal. Pupils are equal, round, and reactive to light. Right eye exhibits no discharge. Left eye exhibits no discharge. No scleral icterus.  Neck: Normal range of motion. Neck supple. No JVD present. No tracheal deviation present. No thyromegaly present.  Cardiovascular: Normal rate, regular rhythm, normal heart sounds and intact distal pulses.  Exam reveals no gallop and no friction rub.   No murmur heard. Pulmonary/Chest: Breath sounds normal. No respiratory distress. He has no wheezes. He has no rales.  Abdominal: Soft. Bowel sounds are normal. He exhibits no mass. There is no hepatosplenomegaly. There is no tenderness. There is no rebound, no guarding and no CVA tenderness.  Musculoskeletal: Normal range of motion. He exhibits no edema or tenderness.  Lymphadenopathy:    He has no cervical adenopathy.  Neurological: He is alert and oriented to person, place, and time. He has normal sensation, normal strength, normal reflexes and intact cranial nerves.  No cranial nerve deficit.  Skin: Skin is warm. No rash noted.  Psychiatric: Mood and affect normal.  Nursing note and vitals reviewed.     Assessment & Plan  Problem List Items Addressed This Visit    None    Visit Diagnoses    Acute maxillary sinusitis, recurrence not specified    -  Primary   Relevant Medications   amoxicillin (AMOXIL) 500 MG capsule   Allergic disorder, initial encounter          Meds ordered this encounter  Medications  . amoxicillin (AMOXIL) 500 MG capsule    Sig: Take 1 capsule (500 mg total) by mouth 3 (three) times daily.    Dispense:  30 capsule    Refill:  1      Dr. Macon [facility]  11/29/16

## 2017-01-19 DIAGNOSIS — Z96612 Presence of left artificial shoulder joint: Secondary | ICD-10-CM | POA: Diagnosis not present

## 2017-01-19 DIAGNOSIS — Z471 Aftercare following joint replacement surgery: Secondary | ICD-10-CM | POA: Diagnosis not present

## 2017-01-19 DIAGNOSIS — M25512 Pain in left shoulder: Secondary | ICD-10-CM | POA: Diagnosis not present

## 2017-01-30 DIAGNOSIS — M1712 Unilateral primary osteoarthritis, left knee: Secondary | ICD-10-CM | POA: Diagnosis not present

## 2017-01-30 DIAGNOSIS — M1711 Unilateral primary osteoarthritis, right knee: Secondary | ICD-10-CM | POA: Diagnosis not present

## 2017-03-19 ENCOUNTER — Other Ambulatory Visit: Payer: Self-pay

## 2017-03-19 DIAGNOSIS — F419 Anxiety disorder, unspecified: Secondary | ICD-10-CM

## 2017-03-19 MED ORDER — CITALOPRAM HYDROBROMIDE 20 MG PO TABS: 20.0000 mg | ORAL_TABLET | Freq: Every day | ORAL | 0 refills | Status: DC

## 2017-03-26 ENCOUNTER — Encounter: Payer: Self-pay | Admitting: Family Medicine

## 2017-03-26 ENCOUNTER — Ambulatory Visit (INDEPENDENT_AMBULATORY_CARE_PROVIDER_SITE_OTHER): Payer: Medicare Other | Admitting: Family Medicine

## 2017-03-26 VITALS — BP 124/78 | HR 61 | Temp 97.8°F | Resp 17 | Ht 76.0 in | Wt 258.0 lb

## 2017-03-26 DIAGNOSIS — F419 Anxiety disorder, unspecified: Secondary | ICD-10-CM

## 2017-03-26 DIAGNOSIS — F3342 Major depressive disorder, recurrent, in full remission: Secondary | ICD-10-CM | POA: Diagnosis not present

## 2017-03-26 MED ORDER — CITALOPRAM HYDROBROMIDE 20 MG PO TABS: 20.0000 mg | ORAL_TABLET | Freq: Every day | ORAL | 3 refills | Status: DC

## 2017-03-26 NOTE — Progress Notes (Signed)
Name: [facility].   MRN: [id number]    DOB: Nov 02, 1945   Date:03/26/2017       Progress Note  Subjective  Chief Complaint  Chief Complaint  Patient presents with  . Medication Refill    Citalopram    Medication Refill  This is a chronic problem. The current episode started more than 1 year ago. The problem has been unchanged. Pertinent negatives include no abdominal pain, chest pain, chills, coughing, fatigue, fever, headaches, myalgias, nausea, neck pain, rash, sore throat or urinary symptoms. Nothing aggravates the symptoms. The treatment provided moderate relief.  Depression       The patient presents with depression.  This is a chronic problem.  The current episode started more than 1 year ago.   The onset quality is gradual.   The problem has been gradually improving since onset.  Associated symptoms include no decreased concentration, no fatigue, no helplessness, no hopelessness, does not have insomnia, not irritable, no restlessness, no decreased interest, no appetite change, no body aches, no myalgias, no headaches, no indigestion, not sad and no suicidal ideas.     The symptoms are aggravated by nothing.  Past treatments include SSRIs - Selective serotonin reuptake inhibitors.  Compliance with treatment is good.  Previous treatment provided mild relief.  Past medical history includes anxiety, eating disorder and depression.     Pertinent negatives include no chronic fatigue syndrome. Anxiety  Presents for follow-up visit. Patient reports no chest pain, decreased concentration, dizziness, excessive worry, insomnia, irritability, nausea, nervous/anxious behavior, palpitations, panic, restlessness, shortness of breath or suicidal ideas. The quality of sleep is good.   His past medical history is significant for depression.    No problem-specific Assessment & Plan notes found for this encounter.   Past Medical History:  Diagnosis Date  . Actinic keratosis   . Allergic  rhinitis   . Anxiety   . Arthritis   . Bilirubinuria   . BPH (benign prostatic hyperplasia)   . Degeneration, intervertebral disc, lumbosacral   . Erectile dysfunction   . Eustachian tube dysfunction   . Fibromyalgia   . GERD (gastroesophageal reflux disease)    OTC if needed Pepcid  . Hypertension   . Osteoarthrosis   . Sinusitis   . Sleep apnea    uses CPAP, will bring mask  . Viral warts     Past Surgical History:  Procedure Laterality Date  . CATARACT EXTRACTION     right  . CHOLECYSTECTOMY  2005  . COLONOSCOPY  2014   cleared for 10 yrs- Dr Allen Norris  . EYE SURGERY Right   . GALLBLADDER SURGERY    . HIP SURGERY     left  . JOINT REPLACEMENT     right and left total hips  . TONSILLECTOMY    . TOTAL SHOULDER ARTHROPLASTY Left 09/03/2014   Procedure: TOTAL SHOULDER ARTHROPLASTY;  Surgeon: Nita Sells, MD;  Location: [place];  Service: Orthopedics;  Laterality: Left;    Family History  Problem Relation Age of Onset  . Breast cancer Mother   . Hypertension Father   . Heart attack Father     Social History   Social History  . Marital status: Married    Spouse name: N/A  . Number of children: N/A  . Years of education: N/A   Occupational History  . retired    Social History Main Topics  . Smoking status: Former Smoker    Packs/day: 1.50    Years: 20.00  Types: Cigarettes    Quit date: 10/03/2007  . Smokeless tobacco: Never Used  . Alcohol use No  . Drug use: No  . Sexual activity: Yes   Other Topics Concern  . Not on file   Social History Narrative  . No narrative on file    Allergies  Allergen Reactions  . Tramadol     Anger issues  . Latex Rash    Outpatient Medications Prior to Visit  Medication Sig Dispense Refill  . amLODipine (NORVASC) 5 MG tablet Take 10 mg by mouth daily. Dr Clayborn Bigness    . labetalol (NORMODYNE) 100 MG tablet Take 100 mg by mouth daily. Dr Clayborn Bigness    . losartan (COZAAR) 100 MG tablet Take 100 mg by mouth  daily. Dr Clayborn Bigness    . tamsulosin (FLOMAX) 0.4 MG CAPS capsule Take 0.4 mg by mouth daily. Dr Bernardo Heater    . citalopram (CELEXA) 20 MG tablet Take 1 tablet (20 mg total) by mouth daily. 30 tablet 0  . amoxicillin (AMOXIL) 500 MG capsule Take 1 capsule (500 mg total) by mouth 3 (three) times daily. 30 capsule 1   No facility-administered medications prior to visit.     Review of Systems  Constitutional: Negative for appetite change, chills, fatigue, fever, irritability, malaise/fatigue and weight loss.  HENT: Negative for ear discharge, ear pain and sore throat.   Eyes: Negative for blurred vision.  Respiratory: Negative for cough, sputum production, shortness of breath and wheezing.   Cardiovascular: Negative for chest pain, palpitations and leg swelling.  Gastrointestinal: Negative for abdominal pain, blood in stool, constipation, diarrhea, heartburn, melena and nausea.  Genitourinary: Negative for dysuria, frequency, hematuria and urgency.  Musculoskeletal: Negative for back pain, joint pain, myalgias and neck pain.  Skin: Negative for rash.  Neurological: Negative for dizziness, tingling, sensory change, focal weakness and headaches.  Endo/Heme/Allergies: Negative for environmental allergies and polydipsia. Does not bruise/bleed easily.  Psychiatric/Behavioral: Positive for depression. Negative for decreased concentration and suicidal ideas. The patient is not nervous/anxious and does not have insomnia.      Objective  Vitals:   03/26/17 0908  BP: 124/78  Pulse: 61  Resp: 17  Temp: 97.8 F (36.6 C)  TempSrc: Oral  SpO2: 95%  Weight: 258 lb (117 kg)  Height: 6\' 4"  (1.93 m)    Physical Exam  Constitutional: He is oriented to person, place, and time and well-developed, well-nourished, and in no distress. He is not irritable.  HENT:  Head: Normocephalic.  Right Ear: External ear normal.  Left Ear: External ear normal.  Nose: Nose normal.  Mouth/Throat: Oropharynx is clear  and moist.  Eyes: Conjunctivae and EOM are normal. Pupils are equal, round, and reactive to light. Right eye exhibits no discharge. Left eye exhibits no discharge. No scleral icterus.  Neck: Normal range of motion. Neck supple. No JVD present. No tracheal deviation present. No thyromegaly present.  Cardiovascular: Normal rate, regular rhythm, normal heart sounds and intact distal pulses.  Exam reveals no gallop and no friction rub.   No murmur heard. Pulmonary/Chest: Breath sounds normal. No respiratory distress. He has no wheezes. He has no rales.  Abdominal: Soft. Bowel sounds are normal. He exhibits no mass. There is no hepatosplenomegaly. There is no tenderness. There is no rebound, no guarding and no CVA tenderness.  Musculoskeletal: Normal range of motion. He exhibits no edema or tenderness.  Lymphadenopathy:    He has no cervical adenopathy.  Neurological: He is alert and oriented to person, place,  and time. He has normal sensation, normal strength, normal reflexes and intact cranial nerves. No cranial nerve deficit.  Skin: Skin is warm. No rash noted.  Psychiatric: Mood and affect normal.  Nursing note and vitals reviewed.     Assessment & Plan  Problem List Items Addressed This Visit    None    Visit Diagnoses    Chronic anxiety    -  Primary   Relevant Medications   citalopram (CELEXA) 20 MG tablet   Recurrent major depressive disorder, in full remission ([place])       Relevant Medications   citalopram (CELEXA) 20 MG tablet      Meds ordered this encounter  Medications  . citalopram (CELEXA) 20 MG tablet    Sig: Take 1 tablet (20 mg total) by mouth daily.    Dispense:  90 tablet    Refill:  3      Dr. Otilio Miu [facility]  03/26/17

## 2017-05-03 DIAGNOSIS — M9903 Segmental and somatic dysfunction of lumbar region: Secondary | ICD-10-CM | POA: Diagnosis not present

## 2017-05-03 DIAGNOSIS — M5417 Radiculopathy, lumbosacral region: Secondary | ICD-10-CM | POA: Diagnosis not present

## 2017-05-03 DIAGNOSIS — M531 Cervicobrachial syndrome: Secondary | ICD-10-CM | POA: Diagnosis not present

## 2017-05-03 DIAGNOSIS — M9901 Segmental and somatic dysfunction of cervical region: Secondary | ICD-10-CM | POA: Diagnosis not present

## 2017-05-09 DIAGNOSIS — M9903 Segmental and somatic dysfunction of lumbar region: Secondary | ICD-10-CM | POA: Diagnosis not present

## 2017-05-09 DIAGNOSIS — M5417 Radiculopathy, lumbosacral region: Secondary | ICD-10-CM | POA: Diagnosis not present

## 2017-05-09 DIAGNOSIS — M531 Cervicobrachial syndrome: Secondary | ICD-10-CM | POA: Diagnosis not present

## 2017-05-09 DIAGNOSIS — M9901 Segmental and somatic dysfunction of cervical region: Secondary | ICD-10-CM | POA: Diagnosis not present

## 2017-05-23 DIAGNOSIS — L821 Other seborrheic keratosis: Secondary | ICD-10-CM | POA: Diagnosis not present

## 2017-05-23 DIAGNOSIS — L570 Actinic keratosis: Secondary | ICD-10-CM | POA: Diagnosis not present

## 2017-05-23 DIAGNOSIS — L400 Psoriasis vulgaris: Secondary | ICD-10-CM | POA: Diagnosis not present

## 2017-05-23 DIAGNOSIS — L718 Other rosacea: Secondary | ICD-10-CM | POA: Diagnosis not present

## 2017-05-23 DIAGNOSIS — L738 Other specified follicular disorders: Secondary | ICD-10-CM | POA: Diagnosis not present

## 2017-05-24 DIAGNOSIS — M9903 Segmental and somatic dysfunction of lumbar region: Secondary | ICD-10-CM | POA: Diagnosis not present

## 2017-05-24 DIAGNOSIS — M9901 Segmental and somatic dysfunction of cervical region: Secondary | ICD-10-CM | POA: Diagnosis not present

## 2017-05-24 DIAGNOSIS — M531 Cervicobrachial syndrome: Secondary | ICD-10-CM | POA: Diagnosis not present

## 2017-05-24 DIAGNOSIS — M5417 Radiculopathy, lumbosacral region: Secondary | ICD-10-CM | POA: Diagnosis not present

## 2017-06-14 DIAGNOSIS — M5417 Radiculopathy, lumbosacral region: Secondary | ICD-10-CM | POA: Diagnosis not present

## 2017-06-14 DIAGNOSIS — M9903 Segmental and somatic dysfunction of lumbar region: Secondary | ICD-10-CM | POA: Diagnosis not present

## 2017-06-14 DIAGNOSIS — M531 Cervicobrachial syndrome: Secondary | ICD-10-CM | POA: Diagnosis not present

## 2017-06-14 DIAGNOSIS — M9901 Segmental and somatic dysfunction of cervical region: Secondary | ICD-10-CM | POA: Diagnosis not present

## 2017-07-06 DIAGNOSIS — L821 Other seborrheic keratosis: Secondary | ICD-10-CM | POA: Diagnosis not present

## 2017-07-06 DIAGNOSIS — L570 Actinic keratosis: Secondary | ICD-10-CM | POA: Diagnosis not present

## 2017-07-06 DIAGNOSIS — L718 Other rosacea: Secondary | ICD-10-CM | POA: Diagnosis not present

## 2017-07-08 DIAGNOSIS — H43391 Other vitreous opacities, right eye: Secondary | ICD-10-CM | POA: Diagnosis not present

## 2017-07-08 DIAGNOSIS — H3562 Retinal hemorrhage, left eye: Secondary | ICD-10-CM | POA: Diagnosis not present

## 2017-07-08 DIAGNOSIS — H43812 Vitreous degeneration, left eye: Secondary | ICD-10-CM | POA: Diagnosis not present

## 2017-07-17 ENCOUNTER — Ambulatory Visit: Payer: Medicare Other

## 2017-07-17 ENCOUNTER — Ambulatory Visit (INDEPENDENT_AMBULATORY_CARE_PROVIDER_SITE_OTHER): Payer: Medicare Other

## 2017-07-17 DIAGNOSIS — Z23 Encounter for immunization: Secondary | ICD-10-CM | POA: Diagnosis not present

## 2017-09-04 ENCOUNTER — Encounter: Payer: Self-pay | Admitting: Family Medicine

## 2017-09-04 ENCOUNTER — Ambulatory Visit (INDEPENDENT_AMBULATORY_CARE_PROVIDER_SITE_OTHER): Payer: Medicare Other | Admitting: Family Medicine

## 2017-09-04 VITALS — BP 130/88 | HR 70 | Ht 76.0 in | Wt 259.0 lb

## 2017-09-04 DIAGNOSIS — A630 Anogenital (venereal) warts: Secondary | ICD-10-CM | POA: Diagnosis not present

## 2017-09-04 DIAGNOSIS — M170 Bilateral primary osteoarthritis of knee: Secondary | ICD-10-CM | POA: Diagnosis not present

## 2017-09-04 NOTE — Progress Notes (Signed)
Name: [facility].   MRN: [id number]    DOB: [birth year]   Date:09/04/2017       Progress Note  Subjective  Chief Complaint  Chief Complaint  Patient presents with  . Genital Warts    had before    Knee Pain   The incident occurred more than 1 week ago. There was no injury mechanism. The pain is present in the right knee and left knee. The quality of the pain is described as aching. The pain is moderate. Pertinent negatives include no tingling. The symptoms are aggravated by movement. He has tried NSAIDs for the symptoms. The treatment provided mild relief.  Rash  This is a recurrent problem. The current episode started 1 to 4 weeks ago (recurrence). The problem has been gradually worsening since onset. The affected locations include the genitalia. He was exposed to nothing. Associated symptoms include joint pain. Pertinent negatives include no anorexia, congestion, cough, diarrhea, eye pain, fever, nail changes, shortness of breath or sore throat.    No problem-specific Assessment & Plan notes found for this encounter.   Past Medical History:  Diagnosis Date  . Actinic keratosis   . Allergic rhinitis   . Anxiety   . Arthritis   . Bilirubinuria   . BPH (benign prostatic hyperplasia)   . Degeneration, intervertebral disc, lumbosacral   . Erectile dysfunction   . Eustachian tube dysfunction   . Fibromyalgia   . GERD (gastroesophageal reflux disease)    OTC if needed Pepcid  . Hypertension   . Osteoarthrosis   . Sinusitis   . Sleep apnea    uses CPAP, will bring mask  . Viral warts     Past Surgical History:  Procedure Laterality Date  . CATARACT EXTRACTION     right  . CHOLECYSTECTOMY  2005  . COLONOSCOPY  2014   cleared for 10 yrs- Dr Allen Norris  . EYE SURGERY Right   . GALLBLADDER SURGERY    . HIP SURGERY     left  . JOINT REPLACEMENT     right and left total hips  . TONSILLECTOMY    . 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 Breast cancer Mother   . Hypertension Father   . Heart attack Father     Social History   Socioeconomic History  . Marital status: Married    Spouse name: Not on file  . Number of children: Not on file  . Years of education: Not on file  . Highest education level: Not on file  Social Needs  . Financial resource strain: Not on file  . Food insecurity - worry: Not on file  . Food insecurity - inability: Not on file  . Transportation needs - medical: Not on file  . Transportation needs - non-medical: Not on file  Occupational History  . Occupation: retired  Tobacco Use  . Smoking status: Former Smoker    Packs/day: 1.50    Years: 20.00    Pack years: 30.00    Types: Cigarettes    Last attempt to quit: 10/03/2007    Years since quitting: 9.9  . Smokeless tobacco: Never Used  Substance and Sexual Activity  . Alcohol use: No  . Drug use: No  . Sexual activity: Yes  Other Topics Concern  . Not  on file  Social History Narrative  . Not on file    Allergies  Allergen Reactions  . Tramadol     Anger issues  . Latex Rash    Outpatient Medications Prior to Visit  Medication Sig Dispense Refill  . amLODipine (NORVASC) 5 MG tablet Take 10 mg by mouth daily. Dr Clayborn Bigness    . citalopram (CELEXA) 20 MG tablet Take 1 tablet (20 mg total) by mouth daily. 90 tablet 3  . labetalol (NORMODYNE) 100 MG tablet Take 100 mg by mouth daily. Dr Clayborn Bigness    . losartan (COZAAR) 100 MG tablet Take 100 mg by mouth daily. Dr Clayborn Bigness    . tamsulosin (FLOMAX) 0.4 MG CAPS capsule Take 0.4 mg by mouth daily. Dr Bernardo Heater     No facility-administered medications prior to visit.     Review of Systems  Constitutional: Negative for chills, fever, malaise/fatigue and weight loss.  HENT: Negative for congestion, ear discharge, ear pain  and sore throat.   Eyes: Negative for blurred vision and pain.  Respiratory: Negative for cough, sputum production, shortness of breath and wheezing.   Cardiovascular: Negative for chest pain, palpitations and leg swelling.  Gastrointestinal: Negative for abdominal pain, anorexia, blood in stool, constipation, diarrhea, heartburn, melena and nausea.  Genitourinary: Negative for dysuria, frequency, hematuria and urgency.  Musculoskeletal: Positive for joint pain. Negative for back pain, myalgias and neck pain.  Skin: Positive for rash. Negative for nail changes.  Neurological: Negative for dizziness, tingling, sensory change, focal weakness and headaches.  Endo/Heme/Allergies: Negative for environmental allergies and polydipsia. Does not bruise/bleed easily.  Psychiatric/Behavioral: Negative for depression and suicidal ideas. The patient is not nervous/anxious and does not have insomnia.      Objective  Vitals:   09/04/17 1338  BP: 130/88  Pulse: 70  Weight: 259 lb (117.5 kg)  Height: 6\' 4"  (1.93 m)    Physical Exam  Constitutional: He is oriented to person, place, and time and well-developed, well-nourished, and in no distress.  HENT:  Head: Normocephalic.  Right Ear: External ear normal.  Left Ear: External ear normal.  Nose: Nose normal.  Mouth/Throat: Oropharynx is clear and moist.  Eyes: Conjunctivae and EOM are normal. Pupils are equal, round, and reactive to light. Right eye exhibits no discharge. Left eye exhibits no discharge. No scleral icterus.  Neck: Normal range of motion. Neck supple. No JVD present. No tracheal deviation present. No thyromegaly present.  Cardiovascular: Normal rate, regular rhythm, normal heart sounds and intact distal pulses. Exam reveals no gallop and no friction rub.  No murmur heard. Pulmonary/Chest: Breath sounds normal. No respiratory distress. He has no wheezes. He has no rales.  Abdominal: Soft. Bowel sounds are normal. He exhibits no mass.  There is no hepatosplenomegaly. There is no tenderness. There is no rebound, no guarding and no CVA tenderness.  Musculoskeletal: Normal range of motion. He exhibits no edema or tenderness.  Lymphadenopathy:    He has no cervical adenopathy.  Neurological: He is alert and oriented to person, place, and time. He has normal sensation, normal strength and intact cranial nerves. No cranial nerve deficit.  Skin: Skin is warm. No rash noted.  Psychiatric: Mood and affect normal.  Nursing note and vitals reviewed.     Assessment & Plan  Problem List Items Addressed This Visit      Musculoskeletal and Integument   Condyloma acuminatum - Primary    Other Visit Diagnoses    Primary osteoarthritis of both knees  No orders of the defined types were placed in this encounter.     Dr. [facility]  09/04/17

## 2017-09-13 ENCOUNTER — Ambulatory Visit (INDEPENDENT_AMBULATORY_CARE_PROVIDER_SITE_OTHER): Payer: Medicare Other

## 2017-09-13 VITALS — BP 140/78 | HR 64 | Temp 98.1°F | Resp 16 | Ht 76.0 in | Wt 259.0 lb

## 2017-09-13 DIAGNOSIS — Z0000 Encounter for general adult medical examination without abnormal findings: Secondary | ICD-10-CM

## 2017-09-13 DIAGNOSIS — Z23 Encounter for immunization: Secondary | ICD-10-CM

## 2017-09-13 NOTE — Progress Notes (Signed)
Subjective:   Joe Patton. is a 71 y.o. male who presents for Medicare Annual/Subsequent preventive examination.  Review of Systems:  N/A Cardiac Risk Factors include: advanced age (>36men, >71 women);hypertension;male gender;obesity (BMI >30kg/m2)     Objective:    Vitals: BP 140/78 (BP Location: Right Arm, Patient Position: Sitting, Cuff Size: Large)   Pulse 64   Temp 98.1 F (36.7 C) (Oral)   Resp 16   Ht 6\' 4"  (1.93 m)   Wt 259 lb (117.5 kg)   BMI 31.53 kg/m   Body mass index is 31.53 kg/m.  Advanced Directives 09/13/2017 08/02/2015 08/25/2014  Does Patient Have a Medical Advance Directive? Yes No Yes  Type of Advance Directive [place]  Does patient want to make changes to medical advance directive? Yes (MAU/Ambulatory/Procedural Areas - Information given) - No - Patient declined  Copy of [place] in Chart? No - copy requested - No - copy requested    Tobacco Social History   Tobacco Use  Smoking Status Former Smoker  . Packs/day: 1.50  . Years: 20.00  . Pack years: 30.00  . Types: Cigarettes  . Last attempt to quit: 10/03/2007  . Years since quitting: 9.9  Smokeless Tobacco Never Used  Tobacco Comment   Has not resumed smoking. No need to provide pt with smoking cessation materials     Counseling given: Not Answered Comment: Has not resumed smoking. No need to provide pt with smoking cessation materials   Clinical Intake:  Pre-visit preparation completed: Yes  Pain : No/denies pain     BMI - recorded: 31.53 Nutritional Risks: None Diabetes: No  Activities of Daily Living: Independent Ambulation: Independent with device- listed below Home Assistive Devices/Equipment: Eyeglasses, Dentures (specify type)(partial upper dentures) Medication Administration: Independent Home Management: Independent  Barriers to Care Management & Learning: None  Do you feel  unsafe in your current relationship?: No(married) Do you feel physically threatened by others?: No Anyone hurting you at home, work, or school?: No  How often do you need to have someone help you when you read instructions, pamphlets, or other written materials from your doctor or pharmacy?: 1 - Never What is the last grade level you completed in school?: some college  Interpreter Needed?: No  Information entered by :: Idell Pickles, LPN  Past Medical History:  Diagnosis Date  . Actinic keratosis   . Allergic rhinitis   . Anxiety   . Arthritis   . Bilirubinuria   . BPH (benign prostatic hyperplasia)   . Degeneration, intervertebral disc, lumbosacral   . Erectile dysfunction   . Eustachian tube dysfunction   . Fibromyalgia   . GERD (gastroesophageal reflux disease)    OTC if needed Pepcid  . Hypertension   . Osteoarthrosis   . Sinusitis   . Sleep apnea    uses CPAP, will bring mask  . Viral warts    Past Surgical History:  Procedure Laterality Date  . CATARACT EXTRACTION     right  . CHOLECYSTECTOMY  2005  . COLONOSCOPY  2014   cleared for 10 yrs- Dr Allen Norris  . EYE SURGERY Right   . GALLBLADDER SURGERY    . HIP SURGERY     left  . JOINT REPLACEMENT     right and left total hips  . TONSILLECTOMY    . 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 Breast cancer Mother   . Hypertension Father   . Heart attack Father   . Stroke Father    Social History   Socioeconomic History  . Marital status: Married    Spouse name: None  . Number of children: 1  . Years of education: None  . Highest education level: None  Social Needs  . Financial resource strain: Not hard at all  . Food insecurity - worry: Never true  . Food insecurity - inability: Never true  . Transportation needs -  medical: No  . Transportation needs - non-medical: No  Occupational History  . Occupation: retired  Tobacco Use  . Smoking status: Former Smoker    Packs/day: 1.50    Years: 20.00    Pack years: 30.00    Types: Cigarettes    Last attempt to quit: 10/03/2007    Years since quitting: 9.9  . Smokeless tobacco: Never Used  . Tobacco comment: Has not resumed smoking. No need to provide pt with smoking cessation materials  Substance and Sexual Activity  . Alcohol use: No  . Drug use: No  . Sexual activity: Yes  Other Topics Concern  . None  Social History Narrative  . None    Outpatient Encounter Medications as of 09/13/2017  Medication Sig  . amLODipine (NORVASC) 5 MG tablet Take 10 mg by mouth daily. Dr Clayborn Bigness  . citalopram (CELEXA) 20 MG tablet Take 1 tablet (20 mg total) by mouth daily.  Marland Kitchen labetalol (NORMODYNE) 100 MG tablet Take 100 mg by mouth daily. Dr Clayborn Bigness  . losartan (COZAAR) 100 MG tablet Take 100 mg by mouth daily. Dr Clayborn Bigness  . Multiple Vitamin (MULTI-VITAMINS) TABS Take 1 tablet by mouth daily.  . tamsulosin (FLOMAX) 0.4 MG CAPS capsule Take 0.4 mg by mouth daily. Dr Bernardo Heater   No facility-administered encounter medications on file as of 09/13/2017.     Activities of Daily Living In your present state of health, do you have any difficulty performing the following activities: 09/13/2017  Hearing? N  Vision? N  Walking or climbing stairs? Y  Comment joint pain  Dressing or bathing? N  Doing errands, shopping? N  Preparing Food and eating ? N  Using the Toilet? N  In the past six months, have you accidently leaked urine? N  Do you have problems with loss of bowel control? N  Managing your Medications? N  Managing your Finances? N  Housekeeping or managing your Housekeeping? N  Some recent data might be hidden    Timed Get Up and Go Performed: Yes. 8 Sec. No intervention required.  Patient Care Team: Juline Patch, MD as PCP - General (Family  Medicine)   Assessment:    Exercise Activities and Dietary recommendations Current Exercise Habits: Structured exercise class, Type of exercise: Other - see comments(recumbant bike), Time (Minutes): 40, Frequency (Times/Week): 3, Weekly Exercise (Minutes/Week): 120, Intensity: Mild, Exercise limited by: orthopedic condition(s)(knee pain)  Goals    . DIET - INCREASE WATER INTAKE     Recommend to drink at least 6-8 8oz glasses of water per day.       Fall Risk Fall Risk  09/13/2017 09/04/2017 07/14/2016 07/08/2015 04/30/2015  Falls in the past year? Yes No No No No  Number falls in past yr: 1 - - - -  Injury with Fall? No - - - -  Comment  Children jumped on him at church causing him to loose his balance - - - -  Follow up Education provided;Falls prevention discussed;Falls evaluation completed - - - -   Is the patient's home free of loose throw rugs in walkways, pet beds, electrical cords, etc?   yes      Grab bars in the bathroom? yes      Handrails on the stairs?   No. Does not have any stairs inside or outside the home.      Adequate lighting?   yes Depression Screen PHQ 2/9 Scores 09/13/2017 09/04/2017 09/04/2017 07/14/2016  PHQ - 2 Score 0 0 0 0  PHQ- 9 Score 0 1 - -    Cognitive Function     6CIT Screen 09/13/2017  What Year? 0 points  What month? 0 points  What time? 0 points  Count back from 20 0 points  Months in reverse 0 points  Repeat phrase 0 points  Total Score 0    Immunization History  Administered Date(s) Administered  . Influenza, High Dose Seasonal PF 07/17/2017  . Influenza, Seasonal, Injecte, Preservative Fre 07/16/2012  . Influenza,inj,Quad PF,6+ Mos 07/01/2013, 08/17/2014, 08/02/2015  . Influenza-Unspecified 07/02/2013, 08/29/2014, 08/01/2016  . Pneumococcal Conjugate-13 09/13/2017  . Pneumococcal Polysaccharide-23 07/01/2013  . Pneumococcal-Unspecified 07/02/2013  . Zoster 03/03/2015   Screening Tests Health Maintenance  Topic Date Due  .  Hepatitis C Screening  December 18, 1945  . TETANUS/TDAP  05/28/1966  . COLONOSCOPY  05/28/1997  . INFLUENZA VACCINE  Completed  . PNA vac Low Risk Adult  Completed   Cancer Screenings: Colorectal: Pt states he had a colonoscopy performed approx 2015 or 2016. Unable to locate report. Advised pt to please provide a copy of his most recent colonoscopy.  Additional Screenings: Hepatitis B/HIV/Syphillis: does not qualify Hepatitis C Screening: Declined my offer to order this screening today. Requested to check with his insurance company re: his out of pocket expense.    Plan:   I have personally reviewed and addressed the Medicare Annual Wellness questionnaire and have noted the following in the patient's chart:  A. Medical and social history B. Use of alcohol, tobacco or illicit drugs  C. Current medications and supplements D. Functional ability and status E.  Nutritional status F.  Physical activity G. Advance directives H. List of other physicians I.  Hospitalizations, surgeries, and ER visits in previous 12 months J.  [place] such as hearing and vision if needed, cognitive and depression L. Referrals and appointments - none  In addition, I have reviewed and discussed with patient certain preventive protocols, quality metrics, and best practice recommendations. A written personalized care plan for preventive services as well as general preventive health recommendations were provided to patient.  See attached scanned questionnaire for additional information.   Signed,  Aleatha Borer, LPN Nurse Health Advisor  MD Recommendations: Due for colonoscopy. States he had a colonoscopy performed approx 2015 or 2016. Unable to locate report. Advised pt to please provide a copy of his most recent colonoscopy. Verbalized acceptance and understanding.  Due for Hep C Screening. Declined my offer to order this screening today. Pt has requested to check with his insurance company re: his  out of pocket expense.  Due for Tdap. Declined my offer to administer today. Education has been provided regarding the importance of this vaccine but still declined. Pt has been advised to call his insurance company to determine his out of pocket expense. Advised he may also receive this vaccine at  his local pharmacy or Health Dept. Verbalized acceptance and understanding.  Received Zostavax 03/18/15. According to [facility] recommendations, pt is due for Shingrix. Education has been provided regarding the importance of this vaccine. Pt has been advised to call his insurance company to determine his out of pocket expense. Advised he may also receive this vaccine at his local pharmacy or Health Dept. Verbalized acceptance and understanding.

## 2017-09-13 NOTE — Patient Instructions (Signed)
Joe Patton , Thank you for taking time to come for your Medicare Wellness Visit. I appreciate your ongoing commitment to your health goals. Please review the following plan we discussed and let me know if I can assist you in the future.   Screening recommendations/referrals: Colonoscopy: Please provide a copy of your most recent colonoscopy. Recommended yearly ophthalmology/optometry visit for glaucoma screening and checkup Recommended yearly dental visit for hygiene and checkup  Vaccinations: Influenza vaccine: Completed 07/17/17 Pneumococcal vaccine: PPSV23 completed 07/02/13 PCV13 given today Tdap vaccine: Declined. Please call your insurance company to determine your out of pocket expense. You may also receive this vaccine at your local pharmacy or Health Dept. Shingles vaccine: Declined. Please call your insurance company to determine your out of pocket expense. You may also receive this vaccine at your local pharmacy or Health Dept.  Advanced directives: Advance directive discussed with you today. I have provided a copy for you to complete at home and have notarized. Once this is complete please bring a copy in to our office so we can scan it into your chart.  Conditions/risks identified: Recommend to drink at least 6-8 8oz glasses of water per day.  Next appointment: Please schedule an appointment with Dr. Ronnald Ramp within the next 30 days for follow up after today's Annual Wellness Visit.  Please schedule your Annual Wellness Visit with your Nurse Health Advisor in one year.  Preventive Care 54 Years and Older, Male Preventive care refers to lifestyle choices and visits with your health care provider that can promote health and wellness. What does preventive care include?  A yearly physical exam. This is also called an annual well check.  Dental exams once or twice a year.  Routine eye exams. Ask your health care provider how often you should have your eyes checked.  Personal  lifestyle choices, including:  Daily care of your teeth and gums.  Regular physical activity.  Eating a healthy diet.  Avoiding tobacco and drug use.  Limiting alcohol use.  Practicing safe sex.  Taking low doses of aspirin every day.  Taking vitamin and mineral supplements as recommended by your health care provider. What happens during an annual well check? The services and screenings done by your health care provider during your annual well check will depend on your age, overall health, lifestyle risk factors, and family history of disease. Counseling  Your health care provider may ask you questions about your:  Alcohol use.  Tobacco use.  Drug use.  Emotional well-being.  Home and relationship well-being.  Sexual activity.  Eating habits.  History of falls.  Memory and ability to understand (cognition).  Work and work Statistician. Screening  You may have the following tests or measurements:  Height, weight, and BMI.  Blood pressure.  Lipid and cholesterol levels. These may be checked every 5 years, or more frequently if you are over 63 years old.  Skin check.  Lung cancer screening. You may have this screening every year starting at age 85 if you have a 30-pack-year history of smoking and currently smoke or have quit within the past 15 years.  Fecal occult blood test (FOBT) of the stool. You may have this test every year starting at age 72.  Flexible sigmoidoscopy or colonoscopy. You may have a sigmoidoscopy every 5 years or a colonoscopy every 10 years starting at age 26.  Prostate cancer screening. Recommendations will vary depending on your family history and other risks.  Hepatitis C blood test.  Hepatitis B blood  test.  Sexually transmitted disease (STD) testing.  Diabetes screening. This is done by checking your blood sugar (glucose) after you have not eaten for a while (fasting). You may have this done every 1-3 years.  Abdominal aortic  aneurysm (AAA) screening. You may need this if you are a current or former smoker.  Osteoporosis. You may be screened starting at age 55 if you are at high risk. Talk with your health care provider about your test results, treatment options, and if necessary, the need for more tests. Vaccines  Your health care provider may recommend certain vaccines, such as:  Influenza vaccine. This is recommended every year.  Tetanus, diphtheria, and acellular pertussis (Tdap, Td) vaccine. You may need a Td booster every 10 years.  Zoster vaccine. You may need this after age 59.  Pneumococcal 13-valent conjugate (PCV13) vaccine. One dose is recommended after age 77.  Pneumococcal polysaccharide (PPSV23) vaccine. One dose is recommended after age 77. Talk to your health care provider about which screenings and vaccines you need and how often you need them. This information is not intended to replace advice given to you by your health care provider. Make sure you discuss any questions you have with your health care provider. Document Released: 10/15/2015 Document Revised: 06/07/2016 Document Reviewed: 07/20/2015 Elsevier Interactive Patient Education  2017 [place] Prevention in the Home Falls can cause injuries. They can happen to people of all ages. There are many things you can do to make your home safe and to help prevent falls. What can I do on the outside of my home?  Regularly fix the edges of walkways and driveways and fix any cracks.  Remove anything that might make you trip as you walk through a door, such as a raised step or threshold.  Trim any bushes or trees on the path to your home.  Use bright outdoor lighting.  Clear any walking paths of anything that might make someone trip, such as rocks or tools.  Regularly check to see if handrails are loose or broken. Make sure that both sides of any steps have handrails.  Any raised decks and porches should have guardrails on  the edges.  Have any leaves, snow, or ice cleared regularly.  Use sand or salt on walking paths during winter.  Clean up any spills in your garage right away. This includes oil or grease spills. What can I do in the bathroom?  Use night lights.  Install grab bars by the toilet and in the tub and shower. Do not use towel bars as grab bars.  Use non-skid mats or decals in the tub or shower.  If you need to sit down in the shower, use a plastic, non-slip stool.  Keep the floor dry. Clean up any water that spills on the floor as soon as it happens.  Remove soap buildup in the tub or shower regularly.  Attach bath mats securely with double-sided non-slip rug tape.  Do not have throw rugs and other things on the floor that can make you trip. What can I do in the bedroom?  Use night lights.  Make sure that you have a light by your bed that is easy to reach.  Do not use any sheets or blankets that are too big for your bed. They should not hang down onto the floor.  Have a firm chair that has side arms. You can use this for support while you get dressed.  Do not have throw rugs  and other things on the floor that can make you trip. What can I do in the kitchen?  Clean up any spills right away.  Avoid walking on wet floors.  Keep items that you use a lot in easy-to-reach places.  If you need to reach something above you, use a strong step stool that has a grab bar.  Keep electrical cords out of the way.  Do not use floor polish or wax that makes floors slippery. If you must use wax, use non-skid floor wax.  Do not have throw rugs and other things on the floor that can make you trip. What can I do with my stairs?  Do not leave any items on the stairs.  Make sure that there are handrails on both sides of the stairs and use them. Fix handrails that are broken or loose. Make sure that handrails are as long as the stairways.  Check any carpeting to make sure that it is firmly  attached to the stairs. Fix any carpet that is loose or worn.  Avoid having throw rugs at the top or bottom of the stairs. If you do have throw rugs, attach them to the floor with carpet tape.  Make sure that you have a light switch at the top of the stairs and the bottom of the stairs. If you do not have them, ask someone to add them for you. What else can I do to help prevent falls?  Wear shoes that:  Do not have high heels.  Have rubber bottoms.  Are comfortable and fit you well.  Are closed at the toe. Do not wear sandals.  If you use a stepladder:  Make sure that it is fully opened. Do not climb a closed stepladder.  Make sure that both sides of the stepladder are locked into place.  Ask someone to hold it for you, if possible.  Clearly mark and make sure that you can see:  Any grab bars or handrails.  First and last steps.  Where the edge of each step is.  Use tools that help you move around (mobility aids) if they are needed. These include:  Canes.  Walkers.  Scooters.  Crutches.  Turn on the lights when you go into a dark area. Replace any light bulbs as soon as they burn out.  Set up your furniture so you have a clear path. Avoid moving your furniture around.  If any of your floors are uneven, fix them.  If there are any pets around you, be aware of where they are.  Review your medicines with your doctor. Some medicines can make you feel dizzy. This can increase your chance of falling. Ask your doctor what other things that you can do to help prevent falls. This information is not intended to replace advice given to you by your health care provider. Make sure you discuss any questions you have with your health care provider. Document Released: 07/15/2009 Document Revised: 02/24/2016 Document Reviewed: 10/23/2014 Elsevier Interactive Patient Education  2017 [employer].

## 2017-09-14 ENCOUNTER — Other Ambulatory Visit: Payer: Self-pay | Admitting: Family Medicine

## 2017-09-14 DIAGNOSIS — F3342 Major depressive disorder, recurrent, in full remission: Secondary | ICD-10-CM

## 2017-09-14 DIAGNOSIS — F419 Anxiety disorder, unspecified: Secondary | ICD-10-CM

## 2017-10-23 ENCOUNTER — Telehealth: Payer: Self-pay | Admitting: Urology

## 2017-10-23 NOTE — Telephone Encounter (Signed)
Est Pt of [place] called [facility] asking for a Tamsulosin refill to be called in for him.  Please advise. Thanks.

## 2017-10-24 DIAGNOSIS — L821 Other seborrheic keratosis: Secondary | ICD-10-CM | POA: Diagnosis not present

## 2017-10-24 MED ORDER — TAMSULOSIN HCL 0.4 MG PO CAPS: 0.4000 mg | ORAL_CAPSULE | Freq: Every day | ORAL | 1 refills | Status: DC

## 2017-10-24 NOTE — Telephone Encounter (Signed)
Spoke with Joe Patton and made aware he has not been seen since 2017 by Dr. Bernardo Heater therefore he would need to be seen. Joe Patton stated [place] of Feb. A f/u appt was made for March. Enough medication was given only until March appt. Joe Patton voiced understanding.

## 2017-12-05 ENCOUNTER — Encounter: Payer: Self-pay | Admitting: Urology

## 2017-12-05 ENCOUNTER — Ambulatory Visit (INDEPENDENT_AMBULATORY_CARE_PROVIDER_SITE_OTHER): Payer: Medicare Other | Admitting: Urology

## 2017-12-05 VITALS — BP 162/80 | HR 65 | Resp 16 | Ht 75.0 in | Wt 255.0 lb

## 2017-12-05 DIAGNOSIS — E6609 Other obesity due to excess calories: Secondary | ICD-10-CM | POA: Diagnosis not present

## 2017-12-05 DIAGNOSIS — N401 Enlarged prostate with lower urinary tract symptoms: Secondary | ICD-10-CM | POA: Insufficient documentation

## 2017-12-05 DIAGNOSIS — N529 Male erectile dysfunction, unspecified: Secondary | ICD-10-CM

## 2017-12-05 DIAGNOSIS — Z6832 Body mass index (BMI) 32.0-32.9, adult: Secondary | ICD-10-CM | POA: Diagnosis not present

## 2017-12-05 DIAGNOSIS — G4733 Obstructive sleep apnea (adult) (pediatric): Secondary | ICD-10-CM | POA: Diagnosis not present

## 2017-12-05 DIAGNOSIS — R011 Cardiac murmur, unspecified: Secondary | ICD-10-CM | POA: Diagnosis not present

## 2017-12-05 DIAGNOSIS — R9431 Abnormal electrocardiogram [ECG] [EKG]: Secondary | ICD-10-CM | POA: Diagnosis not present

## 2017-12-05 DIAGNOSIS — I10 Essential (primary) hypertension: Secondary | ICD-10-CM | POA: Diagnosis not present

## 2017-12-05 DIAGNOSIS — Z8659 Personal history of other mental and behavioral disorders: Secondary | ICD-10-CM | POA: Diagnosis not present

## 2017-12-05 DIAGNOSIS — M1990 Unspecified osteoarthritis, unspecified site: Secondary | ICD-10-CM | POA: Diagnosis not present

## 2017-12-05 MED ORDER — SILDENAFIL CITRATE 20 MG PO TABS: ORAL_TABLET | ORAL | 0 refills | Status: DC

## 2017-12-05 NOTE — Progress Notes (Signed)
12/05/2017 11:52 AM   Joe [address] Jr. 12/18/1945 [id number]  Referring provider: Juline Patch, MD [address] [name], [place] [address]  Chief complaint: Follow-up  HPI: 72 year old male presents for follow-up of BPH and erectile dysfunction.  I last saw him at [facility] on 06/15/2016.  He remains on tamsulosin and has no bothersome lower urinary tract symptoms.  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  He had persistent nocturia postop and is taking tamsulosin for this symptom with good results.  PSA the last he is also using generic sildenafil as needed for erectile dysfunction with good efficacy.  PSA the last few years has been in the 0.3 range.  He denies dysuria or gross hematuria.  He denies flank, abdominal, pelvic or scrotal pain.   PMH: Past Medical History:  Diagnosis Date  . Actinic keratosis   . Allergic rhinitis   . Anxiety   . Arthritis   . Bilirubinuria   . BPH (benign prostatic hyperplasia)   . Degeneration, intervertebral disc, lumbosacral   . Erectile dysfunction   . Eustachian tube dysfunction   . Fibromyalgia   . GERD (gastroesophageal reflux disease)    OTC if needed Pepcid  . Hypertension   . Osteoarthrosis   . Sinusitis   . Sleep apnea    uses CPAP, will bring mask  . Viral warts     Surgical History: Past Surgical History:  Procedure Laterality Date  . CATARACT EXTRACTION     right  . CHOLECYSTECTOMY  2005  . COLONOSCOPY  2014   cleared for 10 yrs- Dr Joe Patton  . EYE SURGERY Right   . GALLBLADDER SURGERY    . HIP SURGERY     left  . JOINT REPLACEMENT     right and left total hips  . TONSILLECTOMY    . TOTAL SHOULDER ARTHROPLASTY Left 09/03/2014   Procedure: TOTAL SHOULDER ARTHROPLASTY;  Surgeon: Joe Sells, MD;  Location: [place];  Service: Orthopedics;  Laterality: Left;    Home Medications:  Allergies as of 12/05/2017      Reactions   Tramadol    Anger issues   Latex Rash      Medication List        Accurate as of 12/05/17 11:52 AM. Always use your most recent med list.          amLODipine 5 MG tablet Commonly known as:  NORVASC Take 10 mg by mouth daily. Dr Joe Patton   citalopram 20 MG tablet Commonly known as:  CELEXA Take 1 tablet (20 mg total) by mouth daily.   labetalol 100 MG tablet Commonly known as:  NORMODYNE Take 100 mg by mouth daily. Dr Joe Patton   losartan 100 MG tablet Commonly known as:  COZAAR Take 100 mg by mouth daily. Dr Joe Patton   MULTI-VITAMINS Tabs Take 1 tablet by mouth daily.   tamsulosin 0.4 MG Caps capsule Commonly known as:  FLOMAX Take 1 capsule (0.4 mg total) by mouth daily. Dr Joe Patton       Allergies:  Allergies  Allergen Reactions  . Tramadol     Anger issues  . Latex Rash    Family History: Family History  Problem Relation Age of Onset  . Breast cancer Mother   . Hypertension Father   . Heart attack Father   . Stroke Father     Social History:  reports that he quit smoking about 10 years ago. His smoking use included cigarettes. He has a  30.00 pack-year smoking history. he has never used smokeless tobacco. He reports that he does not drink alcohol or use drugs.  ROS: UROLOGY Frequent Urination?: No Hard to postpone urination?: No Burning/pain with urination?: No Get up at night to urinate?: No Leakage of urine?: No Urine stream starts and stops?: No Trouble starting stream?: No Do you have to strain to urinate?: No Blood in urine?: No Urinary tract infection?: No Sexually transmitted disease?: No Injury to kidneys or bladder?: No Painful intercourse?: No Weak stream?: No Erection problems?: No Penile pain?: No  Gastrointestinal Nausea?: No Vomiting?: No Indigestion/heartburn?: No Diarrhea?: No Constipation?: No  Constitutional Fever: No Night sweats?: No Weight loss?: No Fatigue?: No  Skin Skin rash/lesions?: No Itching?: No  Eyes Blurred vision?: No Double vision?: No  Ears/Nose/Throat Sore  throat?: No Sinus problems?: No  Hematologic/Lymphatic Swollen glands?: No Easy bruising?: No  Cardiovascular Leg swelling?: No Chest pain?: No  Respiratory Cough?: No Shortness of breath?: No  Endocrine Excessive thirst?: No  Musculoskeletal Back pain?: No Joint pain?: No  Neurological Headaches?: No Dizziness?: No  Psychologic Depression?: No Anxiety?: No  Physical Exam: BP (!) 162/80   Pulse 65   Resp 16   Ht 6\' 3"  (1.905 m)   Wt 255 lb (115.7 kg)   SpO2 98%   BMI 31.87 kg/m   Constitutional:  Alert and oriented, No acute distress. HEENT: [place] AT, moist mucus membranes.  Trachea midline, no masses. Cardiovascular: No clubbing, cyanosis, or edema. Respiratory: Normal respiratory effort, no increased work of breathing. GI: Abdomen is soft, nontender, nondistended, no abdominal masses GU: No CVA tenderness; prostate 35 g, smooth without nodules Lymph: No cervical or inguinal lymphadenopathy. Skin: No rashes, bruises or suspicious lesions. Neurologic: Grossly intact, no focal deficits, moving all 4 extremities. Psychiatric: Normal mood and affect.  Laboratory Data: Lab Results  Component Value Date   WBC 8.1 09/04/2014   HGB 11.6 (L) 09/04/2014   HCT 34.3 (L) 09/04/2014   MCV 87.3 09/04/2014   PLT 212 09/04/2014    Lab Results  Component Value Date   CREATININE 0.84 09/04/2014     Assessment & Plan:    1. Benign prostatic hyperplasia with lower urinary tract symptoms, symptom details unspecified Tamsulosin was refilled.  Based on age and PSA stability he has elected to discontinue PSA screening.  Continue annual follow-up.  2. Erectile dysfunction, unspecified erectile dysfunction type He requested a printed prescription for generic sildenafil.   Return in about 1 year (around 12/06/2018) for Recheck.   [name], [place] [address], [place], [place] [address] [phone]

## 2018-01-08 ENCOUNTER — Other Ambulatory Visit: Payer: Self-pay

## 2018-01-08 MED ORDER — TAMSULOSIN HCL 0.4 MG PO CAPS: 0.4000 mg | ORAL_CAPSULE | Freq: Every day | ORAL | 11 refills | Status: DC

## 2018-02-18 ENCOUNTER — Other Ambulatory Visit: Payer: Self-pay

## 2018-05-08 ENCOUNTER — Other Ambulatory Visit: Payer: Self-pay | Admitting: Family Medicine

## 2018-05-08 DIAGNOSIS — F419 Anxiety disorder, unspecified: Secondary | ICD-10-CM

## 2018-05-08 DIAGNOSIS — F3342 Major depressive disorder, recurrent, in full remission: Secondary | ICD-10-CM

## 2018-05-21 ENCOUNTER — Encounter: Payer: Self-pay | Admitting: Family Medicine

## 2018-05-21 ENCOUNTER — Ambulatory Visit (INDEPENDENT_AMBULATORY_CARE_PROVIDER_SITE_OTHER): Payer: Medicare Other | Admitting: Family Medicine

## 2018-05-21 VITALS — BP 110/60 | HR 60 | Ht 75.0 in | Wt 257.0 lb

## 2018-05-21 DIAGNOSIS — F419 Anxiety disorder, unspecified: Secondary | ICD-10-CM | POA: Diagnosis not present

## 2018-05-21 DIAGNOSIS — F3342 Major depressive disorder, recurrent, in full remission: Secondary | ICD-10-CM

## 2018-05-21 MED ORDER — CITALOPRAM HYDROBROMIDE 20 MG PO TABS: 20.0000 mg | ORAL_TABLET | Freq: Every day | ORAL | 3 refills | Status: DC

## 2018-05-21 MED ORDER — CITALOPRAM HYDROBROMIDE 20 MG PO TABS: 20.0000 mg | ORAL_TABLET | Freq: Every day | ORAL | 0 refills | Status: DC

## 2018-05-21 NOTE — Progress Notes (Signed)
Name: [facility].   MRN: [id number]    DOB: 17-Nov-1945   Date:05/21/2018       Progress Note  Subjective  Chief Complaint  Chief Complaint  Patient presents with  . Depression    Depression         This is a chronic problem.  The current episode started more than 1 year ago.   The onset quality is gradual.   The problem occurs daily.The problem is unchanged.  Associated symptoms include no decreased concentration, no fatigue, no helplessness, no hopelessness, does not have insomnia, not irritable, no restlessness, no decreased interest, no appetite change, no body aches, no myalgias, no headaches, no indigestion, not sad and no suicidal ideas.     The symptoms are aggravated by nothing.  Past treatments include SSRIs - Selective serotonin reuptake inhibitors.  Compliance with treatment is good.  Previous treatment provided mild relief.   Recurrent major depressive disorder, in full remission (HCC) Chronic Stable Recurrent Continue celexa 20 mg daily.   Past Medical History:  Diagnosis Date  . Actinic keratosis   . Allergic rhinitis   . Anxiety   . Arthritis   . Bilirubinuria   . BPH (benign prostatic hyperplasia)   . Degeneration, intervertebral disc, lumbosacral   . Erectile dysfunction   . Eustachian tube dysfunction   . Fibromyalgia   . GERD (gastroesophageal reflux disease)    OTC if needed Pepcid  . Hypertension   . Osteoarthrosis   . Sinusitis   . Sleep apnea    uses CPAP, will bring mask  . Viral warts     Past Surgical History:  Procedure Laterality Date  . CATARACT EXTRACTION     right  . CHOLECYSTECTOMY  2005  . COLONOSCOPY  2014   cleared for 10 yrs- Dr Allen Norris  . EYE SURGERY Right   . GALLBLADDER SURGERY    . HIP SURGERY     left  . JOINT REPLACEMENT     right and left total hips  . TONSILLECTOMY    . TOTAL SHOULDER ARTHROPLASTY Left 09/03/2014   Procedure: TOTAL SHOULDER ARTHROPLASTY;  Surgeon: Nita Sells, MD;  Location: [place];   Service: Orthopedics;  Laterality: Left;    Family History  Problem Relation Age of Onset  . Breast cancer Mother   . Hypertension Father   . Heart attack Father   . Stroke Father     Social History   Socioeconomic History  . Marital status: Married    Spouse name: Not on file  . Number of children: 1  . Years of education: Not on file  . Highest education level: Not on file  Occupational History  . Occupation: retired  Scientific laboratory technician  . Financial resource strain: Not hard at all  . Food insecurity:    Worry: Never true    Inability: Never true  . Transportation needs:    Medical: No    Non-medical: No  Tobacco Use  . Smoking status: Former Smoker    Packs/day: 1.50    Years: 20.00    Pack years: 30.00    Types: Cigarettes    Last attempt to quit: 10/03/2007    Years since quitting: 10.6  . Smokeless tobacco: Never Used  . Tobacco comment: Has not resumed smoking. No need to provide pt with smoking cessation materials  Substance and Sexual Activity  . Alcohol use: No  . Drug use: No  . Sexual activity: Yes  Lifestyle  .  Physical activity:    Days per week: 3 days    Minutes per session: 40 min  . Stress: Not at all  Relationships  . Social connections:    Talks on phone: More than three times a week    Gets together: More than three times a week    Attends religious service: More than 4 times per year    Active member of club or organization: Yes    Attends meetings of clubs or organizations: More than 4 times per year    Relationship status: Married  . Intimate partner violence:    Fear of current or ex partner: No    Emotionally abused: No    Physically abused: No    Forced sexual activity: No  Other Topics Concern  . Not on file  Social History Narrative  . Not on file    Allergies  Allergen Reactions  . Tramadol     Anger issues  . Latex Rash    Outpatient Medications Prior to Visit  Medication Sig Dispense Refill  . amLODipine (NORVASC) 5  MG tablet Take 10 mg by mouth daily. Dr Clayborn Bigness    . labetalol (NORMODYNE) 100 MG tablet Take 100 mg by mouth daily. Dr Clayborn Bigness    . losartan (COZAAR) 100 MG tablet Take 100 mg by mouth daily. Dr Clayborn Bigness    . Multiple Vitamin (MULTI-VITAMINS) TABS Take 1 tablet by mouth daily.    . sildenafil (REVATIO) 20 MG tablet 2-5 tabs 1 hour prior to intercourse 90 tablet 0  . tamsulosin (FLOMAX) 0.4 MG CAPS capsule Take 1 capsule (0.4 mg total) by mouth daily. Dr Bernardo Heater 30 capsule 11  . citalopram (CELEXA) 20 MG tablet Take 1 tablet (20 mg total) by mouth daily. 30 tablet 0   No facility-administered medications prior to visit.     Review of Systems  Constitutional: Negative for appetite change, chills, fatigue, fever, malaise/fatigue and weight loss.  HENT: Negative for ear discharge, ear pain and sore throat.   Eyes: Negative for blurred vision.  Respiratory: Negative for cough, sputum production, shortness of breath and wheezing.   Cardiovascular: Negative for chest pain, palpitations and leg swelling.  Gastrointestinal: Negative for abdominal pain, blood in stool, constipation, diarrhea, heartburn, melena and nausea.  Genitourinary: Negative for dysuria, frequency, hematuria and urgency.  Musculoskeletal: Negative for back pain, joint pain, myalgias and neck pain.  Skin: Negative for rash.  Neurological: Negative for dizziness, tingling, sensory change, focal weakness and headaches.  Endo/Heme/Allergies: Negative for environmental allergies and polydipsia. Does not bruise/bleed easily.  Psychiatric/Behavioral: Positive for depression. Negative for decreased concentration and suicidal ideas. The patient is not nervous/anxious and does not have insomnia.      Objective  Vitals:   05/21/18 1129  BP: 110/60  Pulse: 60  Weight: 257 lb (116.6 kg)  Height: 6\' 3"  (1.905 m)    Physical Exam  Constitutional: He is oriented to person, place, and time. He is not irritable.  HENT:  Head:  Normocephalic.  Right Ear: External ear normal.  Left Ear: External ear normal.  Nose: Nose normal.  Mouth/Throat: Oropharynx is clear and moist.  Eyes: Pupils are equal, round, and reactive to light. Conjunctivae and EOM are normal. Right eye exhibits no discharge. Left eye exhibits no discharge. No scleral icterus.  Neck: Normal range of motion. Neck supple. No JVD present. No tracheal deviation present. No thyromegaly present.  Cardiovascular: Normal rate, regular rhythm, normal heart sounds and intact distal pulses. Exam reveals  no gallop and no friction rub.  No murmur heard. Pulmonary/Chest: Breath sounds normal. No respiratory distress. He has no wheezes. He has no rales.  Abdominal: Soft. Bowel sounds are normal. He exhibits no mass. There is no hepatosplenomegaly. There is no tenderness. There is no rebound, no guarding and no CVA tenderness.  Musculoskeletal: Normal range of motion. He exhibits no edema or tenderness.  Lymphadenopathy:    He has no cervical adenopathy.  Neurological: He is alert and oriented to person, place, and time. He has normal strength and normal reflexes. No cranial nerve deficit.  Skin: Skin is warm. No rash noted.  Nursing note and vitals reviewed.     Assessment & Plan  Problem List Items Addressed This Visit      Other   Recurrent major depressive disorder, in full remission ([place]) - Primary    Chronic Stable Recurrent Continue celexa 20 mg daily.      Relevant Medications   citalopram (CELEXA) 20 MG tablet    Other Visit Diagnoses    Chronic anxiety       Chronic Stable Continue celexa 20 mg daily.   Relevant Medications   citalopram (CELEXA) 20 MG tablet      Meds ordered this encounter  Medications  . DISCONTD: citalopram (CELEXA) 20 MG tablet    Sig: Take 1 tablet (20 mg total) by mouth daily.    Dispense:  30 tablet    Refill:  0    Last time filling- sched appt  . citalopram (CELEXA) 20 MG tablet    Sig: Take 1 tablet (20 mg  total) by mouth daily.    Dispense:  90 tablet    Refill:  3    Last time filling- sched appt      Dr. Otilio Miu [facility]  05/21/18

## 2018-05-21 NOTE — Assessment & Plan Note (Addendum)
Chronic Stable Recurrent Continue celexa 20 mg daily.

## 2018-07-11 ENCOUNTER — Ambulatory Visit (INDEPENDENT_AMBULATORY_CARE_PROVIDER_SITE_OTHER): Payer: Medicare Other

## 2018-07-11 DIAGNOSIS — Z23 Encounter for immunization: Secondary | ICD-10-CM

## 2018-07-15 DIAGNOSIS — A630 Anogenital (venereal) warts: Secondary | ICD-10-CM | POA: Diagnosis not present

## 2018-07-15 DIAGNOSIS — L821 Other seborrheic keratosis: Secondary | ICD-10-CM | POA: Diagnosis not present

## 2018-07-15 DIAGNOSIS — L570 Actinic keratosis: Secondary | ICD-10-CM | POA: Diagnosis not present

## 2018-07-15 DIAGNOSIS — L578 Other skin changes due to chronic exposure to nonionizing radiation: Secondary | ICD-10-CM | POA: Diagnosis not present

## 2018-07-15 DIAGNOSIS — Z872 Personal history of diseases of the skin and subcutaneous tissue: Secondary | ICD-10-CM | POA: Diagnosis not present

## 2018-07-15 DIAGNOSIS — Z1283 Encounter for screening for malignant neoplasm of skin: Secondary | ICD-10-CM | POA: Diagnosis not present

## 2018-08-20 ENCOUNTER — Other Ambulatory Visit: Payer: Self-pay

## 2018-10-16 ENCOUNTER — Encounter: Payer: Self-pay | Admitting: Family Medicine

## 2018-10-16 ENCOUNTER — Ambulatory Visit (INDEPENDENT_AMBULATORY_CARE_PROVIDER_SITE_OTHER): Payer: Medicare Other | Admitting: Family Medicine

## 2018-10-16 VITALS — BP 120/68 | HR 64 | Ht 75.0 in | Wt 262.0 lb

## 2018-10-16 DIAGNOSIS — R05 Cough: Secondary | ICD-10-CM

## 2018-10-16 DIAGNOSIS — J0100 Acute maxillary sinusitis, unspecified: Secondary | ICD-10-CM

## 2018-10-16 DIAGNOSIS — R059 Cough, unspecified: Secondary | ICD-10-CM

## 2018-10-16 MED ORDER — GUAIFENESIN-CODEINE 100-10 MG/5ML PO SYRP: 5.0000 mL | ORAL_SOLUTION | Freq: Three times a day (TID) | ORAL | 0 refills | Status: DC | PRN

## 2018-10-16 MED ORDER — AMOXICILLIN 500 MG PO CAPS: 500.0000 mg | ORAL_CAPSULE | Freq: Three times a day (TID) | ORAL | 0 refills | Status: DC

## 2018-10-16 NOTE — Progress Notes (Signed)
Date:  10/16/2018   Name:  Joe Patton.   DOB:  [birth year]   MRN:  [id number]   Chief Complaint: Sinusitis (cong, cough- yellow production)  Sinusitis  This is a new problem. The current episode started in the past 7 days. The problem has been gradually worsening since onset. There has been no fever. The pain is mild. Associated symptoms include congestion and sinus pressure. Pertinent negatives include no chills, coughing, diaphoresis, ear pain, headaches, hoarse voice, neck pain, shortness of breath, sneezing, sore throat or swollen glands. (Productive nasal drainage/yellow) Past treatments include oral decongestants. The treatment provided mild relief.    Review of Systems  Constitutional: Negative for chills, diaphoresis and fever.  HENT: Positive for congestion and sinus pressure. Negative for drooling, ear discharge, ear pain, hoarse voice, sneezing and sore throat.   Respiratory: Negative for cough, shortness of breath and wheezing.   Cardiovascular: Negative for chest pain, palpitations and leg swelling.  Gastrointestinal: Negative for abdominal pain, blood in stool, constipation, diarrhea and nausea.  Endocrine: Negative for polydipsia.  Genitourinary: Negative for dysuria, frequency, hematuria and urgency.  Musculoskeletal: Negative for back pain, myalgias and neck pain.  Skin: Negative for rash.  Allergic/Immunologic: Negative for environmental allergies.  Neurological: Negative for dizziness and headaches.  Hematological: Does not bruise/bleed easily.  Psychiatric/Behavioral: Negative for suicidal ideas. The patient is not nervous/anxious.     Patient Active Problem List   Diagnosis Date Noted  . Recurrent major depressive disorder, in full remission ([place]) 05/21/2018  . Benign prostatic hyperplasia with lower urinary tract symptoms 12/05/2017  . Erectile dysfunction 12/05/2017  . Condyloma acuminatum 09/04/2017  . Status post total shoulder arthroplasty  09/03/2014  . OSA (obstructive sleep apnea) 05/08/2014    Allergies  Allergen Reactions  . Tramadol     Anger issues  . Latex Rash    Past Surgical History:  Procedure Laterality Date  . CATARACT EXTRACTION     right  . CHOLECYSTECTOMY  2005  . COLONOSCOPY  2014   cleared for 10 yrs- Dr Allen Norris  . EYE SURGERY Right   . GALLBLADDER SURGERY    . HIP SURGERY     left  . JOINT REPLACEMENT     right and left total hips  . TONSILLECTOMY    . TOTAL SHOULDER ARTHROPLASTY Left 09/03/2014   Procedure: TOTAL SHOULDER ARTHROPLASTY;  Surgeon: Nita Sells, MD;  Location: [place];  Service: Orthopedics;  Laterality: Left;    Social History   Tobacco Use  . Smoking status: Former Smoker    Packs/day: 1.50    Years: 20.00    Pack years: 30.00    Types: Cigarettes    Last attempt to quit: 10/03/2007    Years since quitting: 11.0  . Smokeless tobacco: Never Used  . Tobacco comment: Has not resumed smoking. No need to provide pt with smoking cessation materials  Substance Use Topics  . Alcohol use: No  . Drug use: No     Medication list has been reviewed and updated.  Current Meds  Medication Sig  . amLODipine (NORVASC) 5 MG tablet Take 10 mg by mouth daily. Dr Clayborn Bigness  . citalopram (CELEXA) 20 MG tablet Take 1 tablet (20 mg total) by mouth daily.  Marland Kitchen labetalol (NORMODYNE) 100 MG tablet Take 100 mg by mouth daily. Dr Clayborn Bigness  . losartan (COZAAR) 100 MG tablet Take 100 mg by mouth daily. Dr Clayborn Bigness  . Multiple Vitamin (MULTI-VITAMINS) TABS Take 1 tablet by  mouth daily.  . sildenafil (REVATIO) 20 MG tablet 2-5 tabs 1 hour prior to intercourse  . tamsulosin (FLOMAX) 0.4 MG CAPS capsule Take 1 capsule (0.4 mg total) by mouth daily. Dr Bernardo Heater    [facility] 2/9 Scores 10/16/2018 05/21/2018 09/13/2017 09/04/2017  PHQ - 2 Score 0 0 0 0  PHQ- 9 Score 0 0 0 1    Physical Exam Vitals signs and nursing note reviewed.  HENT:     Head: Normocephalic.     Right Ear: External ear  normal.     Left Ear: External ear normal.     Nose: Nose normal.  Eyes:     General: No scleral icterus.       Right eye: No discharge.        Left eye: No discharge.     Conjunctiva/sclera: Conjunctivae normal.     Pupils: Pupils are equal, round, and reactive to light.  Neck:     Musculoskeletal: Normal range of motion and neck supple.     Thyroid: No thyromegaly.     Vascular: No JVD.     Trachea: No tracheal deviation.  Cardiovascular:     Rate and Rhythm: Normal rate and regular rhythm.     Heart sounds: Normal heart sounds. No murmur. No friction rub. No gallop.   Pulmonary:     Effort: No respiratory distress.     Breath sounds: Normal breath sounds. No wheezing or rales.  Abdominal:     General: Bowel sounds are normal.     Palpations: Abdomen is soft. There is no mass.     Tenderness: There is no abdominal tenderness. There is no guarding or rebound.  Musculoskeletal: Normal range of motion.        General: No tenderness.  Lymphadenopathy:     Cervical: No cervical adenopathy.  Skin:    General: Skin is warm.     Findings: No rash.  Neurological:     Mental Status: He is alert and oriented to person, place, and time.     Cranial Nerves: No cranial nerve deficit.     Deep Tendon Reflexes: Reflexes are normal and symmetric.     BP 120/68   Pulse 64   Ht 6\' 3"  (1.905 m)   Wt 262 lb (118.8 kg)   BMI 32.75 kg/m   Assessment and Plan: 1. Acute maxillary sinusitis, recurrence not specified Acute.  Stable.  Will prescribe amoxicillin 500 mg 3 times a day. - amoxicillin (AMOXIL) 500 MG capsule; Take 1 capsule (500 mg total) by mouth 3 (three) times daily.  Dispense: 30 capsule; Refill: 0  2. Cough She is developing a cough with light relief at night.  Robitussin-AC teaspoon every 6 to 8 hours as needed cough prescribed. - guaiFENesin-codeine (ROBITUSSIN AC) 100-10 MG/5ML syrup; Take 5 mLs by mouth 3 (three) times daily as needed for cough.  Dispense: 100 mL;  Refill: 0

## 2018-10-28 ENCOUNTER — Telehealth: Payer: Self-pay | Admitting: Family Medicine

## 2018-10-28 NOTE — Telephone Encounter (Signed)
Called to schedule Medicare Annual Wellness Visit with the Nurse Health Advisor. Spoke with patient and he will check his schedule and call back in a couple of weeks.  If patient returns call, please note: their last AWV was on 09/13/17, please schedule AWV with NHA any date AFTER 09/13/2018  Thank you! For any questions please contact: Janace Hoard at [phone] or Skype [email or web address]

## 2018-12-11 ENCOUNTER — Encounter: Payer: Self-pay | Admitting: Urology

## 2018-12-11 ENCOUNTER — Ambulatory Visit (INDEPENDENT_AMBULATORY_CARE_PROVIDER_SITE_OTHER): Payer: Medicare Other | Admitting: Urology

## 2018-12-11 ENCOUNTER — Other Ambulatory Visit: Payer: Self-pay

## 2018-12-11 VITALS — BP 102/55 | HR 59 | Ht 75.0 in | Wt 264.7 lb

## 2018-12-11 DIAGNOSIS — N5201 Erectile dysfunction due to arterial insufficiency: Secondary | ICD-10-CM

## 2018-12-11 DIAGNOSIS — Z01818 Encounter for other preprocedural examination: Secondary | ICD-10-CM | POA: Insufficient documentation

## 2018-12-11 DIAGNOSIS — I10 Essential (primary) hypertension: Secondary | ICD-10-CM | POA: Insufficient documentation

## 2018-12-11 DIAGNOSIS — M51379 Other intervertebral disc degeneration, lumbosacral region without mention of lumbar back pain or lower extremity pain: Secondary | ICD-10-CM | POA: Insufficient documentation

## 2018-12-11 DIAGNOSIS — L821 Other seborrheic keratosis: Secondary | ICD-10-CM | POA: Insufficient documentation

## 2018-12-11 DIAGNOSIS — N401 Enlarged prostate with lower urinary tract symptoms: Secondary | ICD-10-CM

## 2018-12-11 DIAGNOSIS — H6980 Other specified disorders of Eustachian tube, unspecified ear: Secondary | ICD-10-CM | POA: Insufficient documentation

## 2018-12-11 DIAGNOSIS — M5137 Other intervertebral disc degeneration, lumbosacral region: Secondary | ICD-10-CM | POA: Insufficient documentation

## 2018-12-11 DIAGNOSIS — M1610 Unilateral primary osteoarthritis, unspecified hip: Secondary | ICD-10-CM | POA: Insufficient documentation

## 2018-12-11 DIAGNOSIS — W57XXXA Bitten or stung by nonvenomous insect and other nonvenomous arthropods, initial encounter: Secondary | ICD-10-CM | POA: Insufficient documentation

## 2018-12-11 DIAGNOSIS — J329 Chronic sinusitis, unspecified: Secondary | ICD-10-CM | POA: Insufficient documentation

## 2018-12-11 DIAGNOSIS — M7540 Impingement syndrome of unspecified shoulder: Secondary | ICD-10-CM | POA: Insufficient documentation

## 2018-12-11 DIAGNOSIS — J309 Allergic rhinitis, unspecified: Secondary | ICD-10-CM | POA: Insufficient documentation

## 2018-12-11 DIAGNOSIS — R011 Cardiac murmur, unspecified: Secondary | ICD-10-CM | POA: Insufficient documentation

## 2018-12-11 DIAGNOSIS — N390 Urinary tract infection, site not specified: Secondary | ICD-10-CM | POA: Insufficient documentation

## 2018-12-11 DIAGNOSIS — L570 Actinic keratosis: Secondary | ICD-10-CM | POA: Insufficient documentation

## 2018-12-11 DIAGNOSIS — R822 Biliuria: Secondary | ICD-10-CM | POA: Insufficient documentation

## 2018-12-11 DIAGNOSIS — M353 Polymyalgia rheumatica: Secondary | ICD-10-CM | POA: Insufficient documentation

## 2018-12-11 DIAGNOSIS — I872 Venous insufficiency (chronic) (peripheral): Secondary | ICD-10-CM | POA: Insufficient documentation

## 2018-12-11 DIAGNOSIS — Z23 Encounter for immunization: Secondary | ICD-10-CM | POA: Insufficient documentation

## 2018-12-11 MED ORDER — TAMSULOSIN HCL 0.4 MG PO CAPS: 0.4000 mg | ORAL_CAPSULE | Freq: Every day | ORAL | 11 refills | Status: DC

## 2018-12-11 MED ORDER — SILDENAFIL CITRATE 20 MG PO TABS: ORAL_TABLET | ORAL | 0 refills | Status: DC

## 2018-12-11 NOTE — Progress Notes (Signed)
12/11/2018 11:44 AM   Joe Patton [address] Jr. 1946/09/14 [id number]  Referring provider: Juline Patch, MD [address] [place], [place] [address]  Chief Complaint  Patient presents with  . Benign Prostatic Hypertrophy   Urologic history: 1.  BPH with lower urinary tract symptoms  -PVP January 2014  -Remains on tamsulosin for nocturia  2.  Erectile dysfunction  -On generic sildenafil   HPI: 73 year old male presents for annual follow-up.  He has no complaints today.  He continues to do well and has no bothersome lower urinary tract symptoms. Denies dysuria, gross hematuria or flank/abdominal/pelvic/scrotal pain.  IPSS completed today was 3/2.  He has occasional frequency, urgency and gets up once per night to void.  Sildenafil continues to be effective for his ED.   PMH: Past Medical History:  Diagnosis Date  . Actinic keratosis   . Allergic rhinitis   . Anxiety   . Arthritis   . Bilirubinuria   . BPH (benign prostatic hyperplasia)   . Degeneration, intervertebral disc, lumbosacral   . Erectile dysfunction   . Eustachian tube dysfunction   . Fibromyalgia   . GERD (gastroesophageal reflux disease)    OTC if needed Pepcid  . Hypertension   . Osteoarthrosis   . Sinusitis   . Sleep apnea    uses CPAP, will bring mask  . Viral warts     Surgical History: Past Surgical History:  Procedure Laterality Date  . CATARACT EXTRACTION     right  . CHOLECYSTECTOMY  2005  . COLONOSCOPY  2014   cleared for 10 yrs- Dr Allen Norris  . EYE SURGERY Right   . GALLBLADDER SURGERY    . HIP SURGERY     left  . JOINT REPLACEMENT     right and left total hips  . TONSILLECTOMY    . TOTAL SHOULDER ARTHROPLASTY Left 09/03/2014   Procedure: TOTAL SHOULDER ARTHROPLASTY;  Surgeon: Nita Sells, MD;  Location: [facility];  Service: Orthopedics;  Laterality: Left;    Home Medications:  Allergies as of 12/11/2018      Reactions   Tramadol    Anger issues   Latex Rash       Medication List       Accurate as of December 11, 2018 11:44 AM. Always use your most recent med list.        amLODipine 5 MG tablet Commonly known as:  NORVASC Take 10 mg by mouth daily. Dr Clayborn Bigness   citalopram 20 MG tablet Commonly known as:  CELEXA Take 1 tablet (20 mg total) by mouth daily.   labetalol 100 MG tablet Commonly known as:  NORMODYNE Take 100 mg by mouth daily. Dr Clayborn Bigness   losartan 100 MG tablet Commonly known as:  COZAAR Take 100 mg by mouth daily. Dr Clayborn Bigness   Multi-Vitamins Tabs Take 1 tablet by mouth daily.   sildenafil 20 MG tablet Commonly known as:  REVATIO 2-5 tabs 1 hour prior to intercourse   tamsulosin 0.4 MG Caps capsule Commonly known as:  FLOMAX Take 1 capsule (0.4 mg total) by mouth daily. Dr Bernardo Heater       Allergies:  Allergies  Allergen Reactions  . Tramadol     Anger issues  . Latex Rash    Family History: Family History  Problem Relation Age of Onset  . Breast cancer Mother   . Hypertension Father   . Heart attack Father   . Stroke Father     Social History:  reports that he quit smoking about 11 years ago. His smoking use included cigarettes. He has a 30.00 pack-year smoking history. He has never used smokeless tobacco. He reports that he does not drink alcohol or use drugs.  ROS: UROLOGY Frequent Urination?: No Hard to postpone urination?: No Burning/pain with urination?: No Get up at night to urinate?: No Leakage of urine?: No Urine stream starts and stops?: No Trouble starting stream?: No Do you have to strain to urinate?: No Blood in urine?: No Urinary tract infection?: No Sexually transmitted disease?: No Injury to kidneys or bladder?: No Painful intercourse?: No Weak stream?: No Erection problems?: No Penile pain?: No  Gastrointestinal Nausea?: No Vomiting?: No Indigestion/heartburn?: No Diarrhea?: No Constipation?: No  Constitutional Fever: No Night sweats?: No Weight loss?: No  Fatigue?: No  Skin Skin rash/lesions?: No Itching?: No  Eyes Blurred vision?: No Double vision?: No  Ears/Nose/Throat Sore throat?: No Sinus problems?: No  Hematologic/Lymphatic Swollen glands?: No Easy bruising?: No  Cardiovascular Leg swelling?: No Chest pain?: No  Respiratory Cough?: No Shortness of breath?: No  Endocrine Excessive thirst?: No  Musculoskeletal Back pain?: No Joint pain?: Yes  Neurological Headaches?: No Dizziness?: No  Psychologic Depression?: No Anxiety?: No  Physical Exam: BP (!) 102/55 (BP Location: Left Arm, Patient Position: Sitting, Cuff Size: Large)   Pulse (!) 59   Ht 6\' 3"  (1.905 m)   Wt 264 lb 11.2 oz (120.1 kg)   BMI 33.09 kg/m   Constitutional:  Alert and oriented, No acute distress. HEENT: [place] AT, moist mucus membranes.  Trachea midline, no masses. Cardiovascular: No clubbing, cyanosis, or edema. Respiratory: Normal respiratory effort, no increased work of breathing. Skin: No rashes, bruises or suspicious lesions. Neurologic: Grossly intact, no focal deficits, moving all 4 extremities. Psychiatric: Normal mood and affect.   Assessment & Plan:   73 year old male with stable BPH and erectile dysfunction.  Tamsulosin and sildenafil were refilled.  He will continue annual follow-up.    [name], [place] [address], [place], [place] [address] [phone]

## 2018-12-11 NOTE — Addendum Note (Signed)
Addended by: John Giovanni C on: 12/11/2018 11:51 AM   Modules accepted: Orders

## 2018-12-11 NOTE — Patient Instructions (Signed)

## 2019-01-06 ENCOUNTER — Telehealth: Payer: Self-pay | Admitting: Urology

## 2019-01-06 MED ORDER — SILDENAFIL CITRATE 20 MG PO TABS: ORAL_TABLET | ORAL | 0 refills | Status: DC

## 2019-01-06 NOTE — Telephone Encounter (Signed)
rx sent

## 2019-01-06 NOTE — Telephone Encounter (Signed)
Patient's sildenafil was sent to [place] [facility] by accident so I updated his pharmacy to [facility] and patient just needs [place] to resend it to the correct pharmacy please.  Thanks, Sharyn Lull

## 2019-04-08 ENCOUNTER — Other Ambulatory Visit: Payer: Self-pay

## 2019-04-08 DIAGNOSIS — F419 Anxiety disorder, unspecified: Secondary | ICD-10-CM

## 2019-04-08 MED ORDER — CITALOPRAM HYDROBROMIDE 20 MG PO TABS: 20.0000 mg | ORAL_TABLET | Freq: Every day | ORAL | 0 refills | Status: DC

## 2019-06-18 ENCOUNTER — Telehealth: Payer: Self-pay

## 2019-06-18 NOTE — Telephone Encounter (Signed)
Mika called from [facility] wanting RX and office notes for pt's supplies for CPAP machine. I told them pt is scheduled to come in on Sept 22- will send in everything at that time [id number]

## 2019-06-24 ENCOUNTER — Ambulatory Visit (INDEPENDENT_AMBULATORY_CARE_PROVIDER_SITE_OTHER): Payer: Medicare Other | Admitting: Family Medicine

## 2019-06-24 ENCOUNTER — Encounter: Payer: Self-pay | Admitting: Family Medicine

## 2019-06-24 ENCOUNTER — Other Ambulatory Visit: Payer: Self-pay

## 2019-06-24 VITALS — BP 130/70 | HR 68 | Ht 75.0 in | Wt 260.0 lb

## 2019-06-24 DIAGNOSIS — Z23 Encounter for immunization: Secondary | ICD-10-CM

## 2019-06-24 DIAGNOSIS — F419 Anxiety disorder, unspecified: Secondary | ICD-10-CM

## 2019-06-24 DIAGNOSIS — G4733 Obstructive sleep apnea (adult) (pediatric): Secondary | ICD-10-CM | POA: Diagnosis not present

## 2019-06-24 DIAGNOSIS — F3342 Major depressive disorder, recurrent, in full remission: Secondary | ICD-10-CM | POA: Diagnosis not present

## 2019-06-24 MED ORDER — CITALOPRAM HYDROBROMIDE 20 MG PO TABS: 20.0000 mg | ORAL_TABLET | Freq: Every day | ORAL | 1 refills | Status: DC

## 2019-06-24 NOTE — Progress Notes (Signed)
Date:  06/24/2019   Name:  Joe Patton Joe Patton.   DOB:  04/27/1946   MRN:  [id number]   Chief Complaint: Sleep Apnea (face to face for sleep apnea- pt is using CPAP nightly and is sleeping well. No fatigue/ feels rested in the morning. Taking machine to use, wherever he goes. Is benefitting from use of machine. Needs new machine and supplies. ) and influenza vacc need  Patient is a 73 year old male who presents for a face to face exam for sleep apnea.. The patient reports the following problems: none. Patton maintenance has been reviewed influenza immunization. Depression        This is a chronic problem.  The current episode started more than 1 year ago.   The onset quality is gradual.   The problem occurs rarely.  The problem has been gradually improving since onset.  Associated symptoms include no decreased concentration, no fatigue, no helplessness, no hopelessness, does not have insomnia, not irritable, no restlessness, no decreased interest, no appetite change, no body aches, no myalgias, no headaches, no indigestion, not sad and no suicidal ideas.  Past treatments include SSRIs - Selective serotonin reuptake inhibitors.  Past medical history includes anxiety.   Anxiety Presents for follow-up visit. Patient reports no chest pain, compulsions, confusion, decreased concentration, depressed mood, dizziness, dry mouth, excessive worry, feeling of choking, hyperventilation, impotence, insomnia, irritability, malaise, muscle tension, nausea, nervous/anxious behavior, obsessions, palpitations, panic, restlessness, shortness of breath or suicidal ideas.      Review of Systems  Constitutional: Negative for appetite change, chills, fatigue, fever and irritability.  HENT: Negative for drooling, ear discharge, ear pain and sore throat.   Respiratory: Negative for cough, shortness of breath and wheezing.   Cardiovascular: Negative for chest pain, palpitations and leg swelling.  Gastrointestinal:  Negative for abdominal pain, blood in stool, constipation, diarrhea and nausea.  Endocrine: Negative for polydipsia.  Genitourinary: Negative for dysuria, frequency, hematuria, impotence and urgency.  Musculoskeletal: Negative for back pain, myalgias and neck pain.  Skin: Negative for rash.  Allergic/Immunologic: Negative for environmental allergies.  Neurological: Negative for dizziness and headaches.  Hematological: Does not bruise/bleed easily.  Psychiatric/Behavioral: Positive for depression. Negative for confusion, decreased concentration and suicidal ideas. The patient is not nervous/anxious and does not have insomnia.     Patient Active Problem List   Diagnosis Date Noted  . Actinic keratoses 12/11/2018  . Allergic rhinitis 12/11/2018  . 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 Basal cell papilloma 12/11/2018  . Bilirubin in urine 12/11/2018  . Chronic venous insufficiency 12/11/2018  . Coxitis 12/11/2018  . DDD (degenerative disc disease), lumbosacral 12/11/2018  . Dermatitis, stasis 12/11/2018  . Dysfunction of eustachian tube 12/11/2018  . Flu vaccine need 12/11/2018  . Heart murmur 12/11/2018  . Hypertension 12/11/2018  . Impingement syndrome of shoulder 12/11/2018  . Insect bite, nonvenomous 12/11/2018  . Lower urinary tract infection 12/11/2018  . Need for vaccination 12/11/2018  . Preop examination 12/11/2018  . Sinus infection 12/11/2018  . Recurrent major depressive disorder, in full remission ([place]) 05/21/2018  . Benign prostatic hyperplasia with lower urinary tract symptoms 12/05/2017  . Erectile dysfunction 12/05/2017  . Condyloma acuminatum 09/04/2017  . 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 Presence of artificial shoulder joint 09/03/2014  . Osteoarthritis of both knees 07/07/2014  . 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  . OSA (obstructive sleep apnea) 05/08/2014  . Abnormal EKG 05/06/2014  . Depression 05/06/2014  . Obesity 05/06/2014   .  Urinary urgency 07/22/2012    Allergies  Allergen Reactions  . Tramadol     Anger issues  . Latex Rash    Past Surgical History:  Procedure Laterality Date  . CATARACT EXTRACTION     right  . CHOLECYSTECTOMY  2005  . COLONOSCOPY  2014   cleared for 10 yrs- Dr Allen Norris  . EYE SURGERY Right   . GALLBLADDER SURGERY    . HIP SURGERY     left  . JOINT REPLACEMENT     right and left total hips  . TONSILLECTOMY    . TOTAL SHOULDER ARTHROPLASTY Left 09/03/2014   Procedure: TOTAL SHOULDER ARTHROPLASTY;  Surgeon: Nita Sells, MD;  Location: [facility];  Service: Orthopedics;  Laterality: Left;    Social History   Tobacco Use  . Smoking status: Former Smoker    Packs/day: 1.50    Years: 20.00    Pack years: 30.00    Types: Cigarettes    Quit date: 10/03/2007    Years since quitting: 11.7  . Smokeless tobacco: Never Used  . Tobacco comment: Has not resumed smoking. No need to provide pt with smoking cessation materials  Substance Use Topics  . Alcohol use: No  . Drug use: No     Medication list has been reviewed and updated.  Current Meds  Medication Sig  . amLODipine (NORVASC) 5 MG tablet Take 10 mg by mouth daily. Dr Clayborn Bigness  . citalopram (CELEXA) 20 MG tablet Take 1 tablet (20 mg total) by mouth daily.  Marland Kitchen labetalol (NORMODYNE) 100 MG tablet Take 100 mg by mouth daily. Dr Clayborn Bigness  . losartan (COZAAR) 100 MG tablet Take 100 mg by mouth daily. Dr Clayborn Bigness  . Multiple Vitamin (MULTI-VITAMINS) TABS Take 1 tablet by mouth daily.  . sildenafil (REVATIO) 20 MG tablet 2-5 tabs 1 hour prior to intercourse  . tamsulosin (FLOMAX) 0.4 MG CAPS capsule Take 1 capsule (0.4 mg total) by mouth daily. Dr Bernardo Heater    [facility] 2/9 Scores 06/24/2019 10/16/2018 05/21/2018 09/13/2017  PHQ - 2 Score 0 0 0 0  PHQ- 9 Score 0 0 0 0    BP Readings from Last 3 Encounters:  06/24/19 130/70  12/11/18 (!) 102/55  10/16/18 120/68    Physical Exam Vitals signs and nursing note reviewed.   Constitutional:      General: He is not irritable.    Appearance: He is obese.  HENT:     Head: Normocephalic.     Right Ear: Tympanic membrane, ear canal and external ear normal.     Left Ear: Tympanic membrane, ear canal and external ear normal.     Nose: Nose normal. No congestion or rhinorrhea.     Mouth/Throat:     Mouth: Mucous membranes are moist.  Eyes:     General: No scleral icterus.       Right eye: No discharge.        Left eye: No discharge.     Conjunctiva/sclera: Conjunctivae normal.     Pupils: Pupils are equal, round, and reactive to light.  Neck:     Musculoskeletal: Normal range of motion and neck supple.     Thyroid: No thyromegaly.     Vascular: No JVD.     Trachea: No tracheal deviation.  Cardiovascular:     Rate and Rhythm: Normal rate and regular rhythm.     Heart sounds: Normal heart sounds. No murmur. No friction rub. No gallop.   Pulmonary:     Effort:  No respiratory distress.     Breath sounds: Normal breath sounds. No stridor. No wheezing, rhonchi or rales.  Chest:     Chest wall: No tenderness.  Abdominal:     General: Bowel sounds are normal. There is no distension.     Palpations: Abdomen is soft. There is no mass.     Tenderness: There is no abdominal tenderness. There is no guarding or rebound.     Hernia: No hernia is present.  Musculoskeletal: Normal range of motion.        General: No tenderness.  Lymphadenopathy:     Cervical: No cervical adenopathy.  Skin:    General: Skin is warm.     Capillary Refill: Capillary refill takes less than 2 seconds.     Coloration: Skin is not jaundiced or pale.     Findings: No bruising, erythema, lesion or rash.  Neurological:     Mental Status: He is alert and oriented to person, place, and time.     Cranial Nerves: No cranial nerve deficit.     Deep Tendon Reflexes: Reflexes are normal and symmetric.     Wt Readings from Last 3 Encounters:  06/24/19 260 lb (117.9 kg)  12/11/18 264 lb 11.2 oz  (120.1 kg)  10/16/18 262 lb (118.8 kg)    BP 130/70   Pulse 68   Ht 6\' 3"  (1.905 m)   Wt 260 lb (117.9 kg)   BMI 32.50 kg/m   Assessment and Plan:   1. Influenza vaccine needed Discussed and administered. - Flu Vaccine QUAD High Dose(Fluad)  2. Recurrent major depressive disorder, in full remission ([place]) Chronic.  Controlled.  PHQ score is 0.  Patient will continue citalopram 20 mg once a day. - citalopram (CELEXA) 20 MG tablet; Take 1 tablet (20 mg total) by mouth daily.  Dispense: 90 tablet; Refill: 1  3. Anxiety Chronic.  Controlled.  Gad score 0.  Patient will continue Celexa 20 mg 1 a day.  Patient will be re-seen in 6 months. - citalopram (CELEXA) 20 MG tablet; Take 1 tablet (20 mg total) by mouth daily.  Dispense: 90 tablet; Refill: 1  4. Obstructive sleep apnea This is a face-to-face encounter for sleep apnea so patient can receive another CPAP machine.  Review of systems suggest that patient has had sleep apnea in the past.  This evaluation is going to be prior to the machine use prescription.  Patient does use the CPAP/supplies on a nightly basis.  Patient is benefiting from the CPAP therapy.  Patient has demonstrated CPAP compliance by using machine throughout the night which is greater than 4 hours over a 5-year.  Since he had his home evaluation in 2015.  Patient does take his CPAP with him on all trips without fail.

## 2019-06-25 ENCOUNTER — Encounter: Payer: Self-pay | Admitting: Gastroenterology

## 2019-06-25 ENCOUNTER — Other Ambulatory Visit: Payer: Self-pay

## 2019-08-21 DIAGNOSIS — H43392 Other vitreous opacities, left eye: Secondary | ICD-10-CM | POA: Diagnosis not present

## 2019-08-21 DIAGNOSIS — H26492 Other secondary cataract, left eye: Secondary | ICD-10-CM | POA: Diagnosis not present

## 2019-08-27 ENCOUNTER — Other Ambulatory Visit: Payer: Self-pay

## 2019-09-04 DIAGNOSIS — Z01818 Encounter for other preprocedural examination: Secondary | ICD-10-CM | POA: Diagnosis not present

## 2019-09-04 DIAGNOSIS — R9431 Abnormal electrocardiogram [ECG] [EKG]: Secondary | ICD-10-CM | POA: Diagnosis not present

## 2019-09-04 DIAGNOSIS — G4733 Obstructive sleep apnea (adult) (pediatric): Secondary | ICD-10-CM | POA: Diagnosis not present

## 2019-09-04 DIAGNOSIS — R011 Cardiac murmur, unspecified: Secondary | ICD-10-CM | POA: Diagnosis not present

## 2019-09-04 DIAGNOSIS — I10 Essential (primary) hypertension: Secondary | ICD-10-CM | POA: Diagnosis not present

## 2019-09-04 DIAGNOSIS — E669 Obesity, unspecified: Secondary | ICD-10-CM | POA: Diagnosis not present

## 2019-09-04 DIAGNOSIS — Z6832 Body mass index (BMI) 32.0-32.9, adult: Secondary | ICD-10-CM | POA: Diagnosis not present

## 2019-11-05 ENCOUNTER — Telehealth: Payer: Self-pay

## 2019-11-05 NOTE — Telephone Encounter (Signed)
Pt called in to get a letter for [employer] to move mailbox d/t 55 mile an hour zone. Told him we don't do those, he can see if his ortho or cardio can do that for him

## 2019-12-02 ENCOUNTER — Telehealth: Payer: Self-pay | Admitting: Urology

## 2019-12-02 MED ORDER — SILDENAFIL CITRATE 20 MG PO TABS: ORAL_TABLET | ORAL | 0 refills | Status: DC

## 2019-12-02 NOTE — Telephone Encounter (Signed)
Pt wants his Viagra refilled at [facility]. Please advise pt at [phone].

## 2019-12-02 NOTE — Telephone Encounter (Signed)
Rx sent 

## 2019-12-06 DIAGNOSIS — Z23 Encounter for immunization: Secondary | ICD-10-CM | POA: Diagnosis not present

## 2019-12-11 ENCOUNTER — Ambulatory Visit: Payer: Medicare Other | Admitting: Urology

## 2019-12-12 ENCOUNTER — Other Ambulatory Visit: Payer: Self-pay

## 2019-12-12 ENCOUNTER — Ambulatory Visit (INDEPENDENT_AMBULATORY_CARE_PROVIDER_SITE_OTHER): Payer: Medicare Other | Admitting: Urology

## 2019-12-12 VITALS — BP 158/80 | HR 65 | Ht 75.0 in | Wt 250.0 lb

## 2019-12-12 DIAGNOSIS — N401 Enlarged prostate with lower urinary tract symptoms: Secondary | ICD-10-CM

## 2019-12-12 DIAGNOSIS — N5201 Erectile dysfunction due to arterial insufficiency: Secondary | ICD-10-CM

## 2019-12-12 LAB — BLADDER SCAN AMB NON-IMAGING: Scan Result: 7

## 2019-12-12 MED ORDER — SILDENAFIL CITRATE 20 MG PO TABS: ORAL_TABLET | ORAL | 2 refills | Status: DC

## 2019-12-12 NOTE — Progress Notes (Signed)
12/12/2019 11:20 AM   Joe Patton [address] Jr. December 21, 1945 [id number]  Referring provider: Juline Patch, MD [address] [place],  [place] [address]  Chief Complaint  Patient presents with  . Follow-up    Urologic history: 1.  BPH with lower urinary tract symptoms             -PVP January 2014             -Remains on tamsulosin for nocturia  2.  Erectile dysfunction             -On generic sildenafil   HPI: 74 y.o. male presents for annual follow-up.  -Since visit last year he has noted increased urinary urgency, no incontinence -IPSS 10/35 with urgency most bothersome -QoL rated 4/6 -Remains on tamsulosin -No dysuria, gross hematuria -No flank, abdominal, pelvic pain -Remains on sildenafil    PMH: Past Medical History:  Diagnosis Date  . Actinic keratosis   . Allergic rhinitis   . Anxiety   . Arthritis   . Bilirubinuria   . BPH (benign prostatic hyperplasia)   . Degeneration, intervertebral disc, lumbosacral   . Erectile dysfunction   . Eustachian tube dysfunction   . Fibromyalgia   . GERD (gastroesophageal reflux disease)    OTC if needed Pepcid  . Hypertension   . Osteoarthrosis   . Sinusitis   . Sleep apnea    uses CPAP, will bring mask  . Viral warts     Surgical History: Past Surgical History:  Procedure Laterality Date  . CATARACT EXTRACTION     right  . CHOLECYSTECTOMY  2005  . COLONOSCOPY  2014   cleared for 10 yrs- Dr Allen Norris  . EYE SURGERY Right   . GALLBLADDER SURGERY    . HIP SURGERY     left  . JOINT REPLACEMENT     right and left total hips  . TONSILLECTOMY    . TOTAL SHOULDER ARTHROPLASTY Left 09/03/2014   Procedure: TOTAL SHOULDER ARTHROPLASTY;  Surgeon: Nita Sells, MD;  Location: [facility];  Service: Orthopedics;  Laterality: Left;    Home Medications:  Allergies as of 12/12/2019      Reactions   Tramadol    Anger issues   Latex Rash      Medication List       Accurate as of December 12, 2019 11:20  AM. If you have any questions, ask your nurse or doctor.        amLODipine 5 MG tablet Commonly known as: NORVASC Take 10 mg by mouth daily. Dr Clayborn Bigness   citalopram 20 MG tablet Commonly known as: CELEXA Take 1 tablet (20 mg total) by mouth daily.   labetalol 100 MG tablet Commonly known as: NORMODYNE Take 100 mg by mouth daily. Dr Clayborn Bigness   losartan 100 MG tablet Commonly known as: COZAAR Take 100 mg by mouth daily. Dr Clayborn Bigness   Multi-Vitamins Tabs Take 1 tablet by mouth daily.   sildenafil 20 MG tablet Commonly known as: REVATIO 2-5 tabs 1 hour prior to intercourse   tamsulosin 0.4 MG Caps capsule Commonly known as: FLOMAX Take 1 capsule (0.4 mg total) by mouth daily. Dr Bernardo Heater   vitamin E 1000 UNIT capsule Take by mouth.       Allergies:  Allergies  Allergen Reactions  . Tramadol     Anger issues  . Latex Rash    Family History: Family History  Problem Relation Age of Onset  . Breast cancer Mother   .  Hypertension Father   . Heart attack Father   . Stroke Father     Social History:  reports that he quit smoking about 12 years ago. His smoking use included cigarettes. He has a 30.00 pack-year smoking history. He has never used smokeless tobacco. He reports that he does not drink alcohol or use drugs.   Physical Exam: BP (!) 158/80   Pulse 65   Ht 6\' 3"  (1.905 m)   Wt 250 lb (113.4 kg)   BMI 31.25 kg/m   Constitutional:  Alert and oriented, No acute distress. HEENT: [place] AT, moist mucus membranes.  Trachea midline, no masses. Cardiovascular: No clubbing, cyanosis, or edema. Respiratory: Normal respiratory effort, no increased work of breathing. Skin: No rashes, bruises or suspicious lesions. Neurologic: Grossly intact, no focal deficits, moving all 4 extremities. Psychiatric: Normal mood and affect.   Assessment & Plan:    - Urinary urgency We discussed potential etiologies including overactive bladder secondary to adenoma regrowth and  idiopathic detrusor overactivity.  PVR 7 cc by bladder scan.  Options were discussed of adding an OAB medication.  He was interested in possible additional procedures if he had adenoma regrowth.  He has elected to schedule cystoscopy and we will also schedule TRUS prostate for volume.  - Erectile dysfunction Stable, sildenafil refilled   [name], MD  [facility] [address], [place],  [address] [phone]

## 2019-12-12 NOTE — Patient Instructions (Signed)
Transrectal Ultrasound Transrectal ultrasound is a procedure that uses sound waves to create images of your prostate gland and nearby tissues. The sound waves are sent through the wall of your rectum into your prostate gland, which is located in front of your rectum. The images show the size and shape of your prostate gland and nearby structures. You may have this test if you have:  Trouble urinating.  Infertility.  An abnormal prostate screening exam. Tell a health care provider about:  Any allergies you have.  All medicines you are taking, including vitamins, herbs, eye drops, creams, and over-the-counter medicines.  Any blood disorders you have.  Any medical conditions you have.  Any surgeries you have had. What are the risks? Generally, this is a safe procedure. However, problems may occur, including:  Discomfort during the procedure. This is rare.  Blood in your urine or sperm after the procedure. This is rare. What happens before the procedure?  Your health care provider may instruct you to use an enema 1-4 hours before the procedure. Follow instructions from your health care provider about how to do the enema.  Ask your health care provider about changing or stopping your regular medicines. This is especially important if you are taking diabetes medicines or blood thinners. What happens during the procedure?  You will be asked to lie down on your left side on an examination table.  You will bend your knees toward your chest.  A lubricated probe will be gently inserted into your rectum. This may cause a feeling of fullness.  The probe will send signals to a computer that will create images.  The technician will slightly rotate the probe throughout the procedure. While rotating the probe, he or she will view and capture images of the prostate gland and the surrounding structures from different angles.  The probe will be removed. The procedure may vary among health care  providers and hospitals. What happens after the procedure?  It is up to you to get the results of your procedure. Ask your health care provider, or the department that is doing the procedure, when your results will be ready. Summary  Transrectal ultrasound is a procedure that uses sound waves to create images of your prostate gland and nearby tissues.  The images show the size and shape of your prostate gland and nearby structures.  Before the procedure, ask your health care provider about changing or stopping your regular medicines. This is especially important if you are taking diabetes medicines or blood thinners. This information is not intended to replace advice given to you by your health care provider. Make sure you discuss any questions you have with your health care provider. Document Revised: 08/31/2017 Document Reviewed: 08/11/2016 Elsevier Patient Education  2020 Elsevier Inc. Cystoscopy Cystoscopy is a procedure that is used to help diagnose and sometimes treat conditions that affect the lower urinary tract. The lower urinary tract includes the bladder and the urethra. The urethra is the tube that drains urine from the bladder. Cystoscopy is done using a thin, tube-shaped instrument with a light and camera at the end (cystoscope). The cystoscope may be hard or flexible, depending on the goal of the procedure. The cystoscope is inserted through the urethra, into the bladder. Cystoscopy may be recommended if you have:  Urinary tract infections that keep coming back.  Blood in the urine (hematuria).  An inability to control when you urinate (urinary incontinence) or an overactive bladder.  Unusual cells found in a urine sample.    A blockage in the urethra, such as a urinary stone.  Painful urination.  An abnormality in the bladder found during an intravenous pyelogram (IVP) or CT scan. Cystoscopy may also be done to remove a sample of tissue to be examined under a microscope  (biopsy). Tell a health care provider about:  Any allergies you have.  All medicines you are taking, including vitamins, herbs, eye drops, creams, and over-the-counter medicines.  Any problems you or family members have had with anesthetic medicines.  Any blood disorders you have.  Any surgeries you have had.  Any medical conditions you have.  Whether you are pregnant or may be pregnant. What are the risks? Generally, this is a safe procedure. However, problems may occur, including:  Infection.  Bleeding.  Allergic reactions to medicines.  Damage to other structures or organs. What happens before the procedure?  Ask your health care provider about: ? Changing or stopping your regular medicines. This is especially important if you are taking diabetes medicines or blood thinners. ? Taking medicines such as aspirin and ibuprofen. These medicines can thin your blood. Do not take these medicines unless your health care provider tells you to take them. ? Taking over-the-counter medicines, vitamins, herbs, and supplements.  Follow instructions from your health care provider about eating or drinking restrictions.  Ask your health care provider what steps will be taken to help prevent infection. These may include: ? Washing skin with a germ-killing soap. ? Taking antibiotic medicine.  You may have an exam or testing, such as: ? X-rays of the bladder, urethra, or kidneys. ? Urine tests to check for signs of infection.  Plan to have someone take you home from the hospital or clinic. What happens during the procedure?   You will be given one or more of the following: ? A medicine to help you relax (sedative). ? A medicine to numb the area (local anesthetic).  The area around the opening of your urethra will be cleaned.  The cystoscope will be passed through your urethra into your bladder.  Germ-free (sterile) fluid will flow through the cystoscope to fill your bladder. The  fluid will stretch your bladder so that your health care provider can clearly examine your bladder walls.  Your doctor will look at the urethra and bladder. Your doctor may take a biopsy or remove stones.  The cystoscope will be removed, and your bladder will be emptied. The procedure may vary among health care providers and hospitals. What can I expect after the procedure? After the procedure, it is common to have:  Some soreness or pain in your abdomen and urethra.  Urinary symptoms. These include: ? Mild pain or burning when you urinate. Pain should stop within a few minutes after you urinate. This may last for up to 1 week. ? A small amount of blood in your urine for several days. ? Feeling like you need to urinate but producing only a small amount of urine. Follow these instructions at home: Medicines  Take over-the-counter and prescription medicines only as told by your health care provider.  If you were prescribed an antibiotic medicine, take it as told by your health care provider. Do not stop taking the antibiotic even if you start to feel better. General instructions  Return to your normal activities as told by your health care provider. Ask your health care provider what activities are safe for you.  Do not drive for 24 hours if you were given a sedative during your procedure.    Watch for any blood in your urine. If the amount of blood in your urine increases, call your health care provider.  Follow instructions from your health care provider about eating or drinking restrictions.  If a tissue sample was removed for testing (biopsy) during your procedure, it is up to you to get your test results. Ask your health care provider, or the department that is doing the test, when your results will be ready.  Drink enough fluid to keep your urine pale yellow.  Keep all follow-up visits as told by your health care provider. This is important. Contact a health care provider if  you:  Have pain that gets worse or does not get better with medicine, especially pain when you urinate.  Have trouble urinating.  Have more blood in your urine. Get help right away if you:  Have blood clots in your urine.  Have abdominal pain.  Have a fever or chills.  Are unable to urinate. Summary  Cystoscopy is a procedure that is used to help diagnose and sometimes treat conditions that affect the lower urinary tract.  Cystoscopy is done using a thin, tube-shaped instrument with a light and camera at the end.  After the procedure, it is common to have some soreness or pain in your abdomen and urethra.  Watch for any blood in your urine. If the amount of blood in your urine increases, call your health care provider.  If you were prescribed an antibiotic medicine, take it as told by your health care provider. Do not stop taking the antibiotic even if you start to feel better. This information is not intended to replace advice given to you by your health care provider. Make sure you discuss any questions you have with your health care provider. Document Revised: 09/10/2018 Document Reviewed: 09/10/2018 Elsevier Patient Education  2020 Elsevier Inc.  

## 2019-12-14 ENCOUNTER — Encounter: Payer: Self-pay | Admitting: Urology

## 2019-12-17 ENCOUNTER — Telehealth: Payer: Self-pay | Admitting: Radiology

## 2019-12-17 NOTE — Telephone Encounter (Signed)
Patient would like to know if Medicare A & B covers cystoscopy/TRUS scheduled 12/24/2019. He would like a call back to [phone].

## 2019-12-24 ENCOUNTER — Ambulatory Visit (INDEPENDENT_AMBULATORY_CARE_PROVIDER_SITE_OTHER): Payer: Medicare Other | Admitting: Urology

## 2019-12-24 ENCOUNTER — Other Ambulatory Visit: Payer: Self-pay

## 2019-12-24 ENCOUNTER — Encounter: Payer: Self-pay | Admitting: Urology

## 2019-12-24 VITALS — BP 130/74 | HR 80 | Ht 70.0 in | Wt 250.0 lb

## 2019-12-24 DIAGNOSIS — N401 Enlarged prostate with lower urinary tract symptoms: Secondary | ICD-10-CM | POA: Diagnosis not present

## 2019-12-24 NOTE — Progress Notes (Signed)
   12/24/19  CC:  Chief Complaint  Patient presents with  . Cysto    HPI: 74 y.o. male seen 12/12/2019 with complaints of moderate lower urinary tract symptoms with urgency being his most bothersome symptom.  He declined medical management and was potentially interested in outlet procedure and presents for cystoscopy and TRUS prostate for volume.  Blood pressure 130/74, pulse 80, height 5\' 10"  (1.778 m), weight 250 lb (113.4 kg). NED. A&Ox3.   No respiratory distress   Abd soft, NT, ND Normal phallus with bilateral descended testicles  Cystoscopy Procedure Note  Patient identification was confirmed, informed consent was obtained, and patient was prepped using Betadine solution.  Lidocaine jelly was administered per urethral meatus.     Pre-Procedure: - Inspection reveals a normal caliber urethra meatus.  Procedure: The flexible cystoscope was introduced without difficulty - No urethral strictures/lesions are present. 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  His most bothersome symptom is urgency and we did discuss there is no guarantee that an outlet procedure would resolve the storage related symptoms.  He has initially elected conservative management with medication.  Will increase tamsulosin to 0.8 mg daily which he will take for 2-4 weeks.  Double dose Rx was sent to pharmacy.  If this is not effective we will give a trial of Myrbetriq 50 mg daily x4 weeks.  He was given samples today however instructed not to start taking until he tries the tamsulosin increased first.   [name], MD

## 2019-12-24 NOTE — Progress Notes (Signed)
TRUS Prostate  An 8.5 MHz transrectal ultrasound probe was lubricated and passed per rectum.  The prostate was imaged in transverse and sagittal planes.  There was asymmetric enlargement of the right transition zone.  No peripheral zone abnormalities were noted.  Volume was calculated at 24 cc.  Impression: Asymmetric enlargement of the right transition zone.

## 2019-12-25 ENCOUNTER — Other Ambulatory Visit: Payer: Self-pay | Admitting: *Deleted

## 2019-12-25 ENCOUNTER — Telehealth: Payer: Self-pay | Admitting: Urology

## 2019-12-25 LAB — PSA: Prostate Specific Ag, Serum: 0.6 ng/mL (ref 0.0–4.0)

## 2019-12-25 MED ORDER — TAMSULOSIN HCL 0.4 MG PO CAPS: 0.4000 mg | ORAL_CAPSULE | Freq: Every day | ORAL | 11 refills | Status: DC

## 2019-12-25 NOTE — Telephone Encounter (Signed)
Per Dr. Dene Gentry note yesterday, "Will increase tamsulosin to 0.8 mg daily which he will take for 2-4 weeks.  Double dose Rx was sent to pharmacy"  [facility] [phone]) called the office to request an updated prescription.

## 2019-12-26 NOTE — Telephone Encounter (Signed)
-----   Message from [name], MD sent at 12/25/2019  6:04 PM EDT ----- PSA stable at 0.6

## 2019-12-26 NOTE — Telephone Encounter (Signed)
Notified patient as instructed, patient pleased. Discussed follow-up appointments, patient agrees  

## 2020-01-06 DIAGNOSIS — Z23 Encounter for immunization: Secondary | ICD-10-CM | POA: Diagnosis not present

## 2020-01-16 ENCOUNTER — Telehealth: Payer: Self-pay | Admitting: *Deleted

## 2020-01-16 NOTE — Telephone Encounter (Signed)
Patient  increase tamsulosin to 0.8 mg daily.Patient states the tamsulosin did not work. Advised patient to try Myrbetriq 50 mg daily x4 weeks.  Instructed patient to stop Tamsulosin and try Mrybetriq. Call us and report in one month

## 2020-01-20 ENCOUNTER — Telehealth: Payer: Self-pay | Admitting: Family Medicine

## 2020-01-20 NOTE — Telephone Encounter (Signed)
Pt will call back when he returns from out of town and schedule [employer] Visit (AWV) either virtually/audio only or in office. Whichever the patients preference is.  Last AWV 09/13/17; please schedule at anytime with [facility] Health Advisor.

## 2020-02-11 ENCOUNTER — Telehealth: Payer: Self-pay | Admitting: *Deleted

## 2020-02-11 MED ORDER — TOLTERODINE TARTRATE ER 4 MG PO CP24: 4.0000 mg | ORAL_CAPSULE | Freq: Every day | ORAL | 0 refills | Status: DC

## 2020-02-11 NOTE — Telephone Encounter (Signed)
Pt calling stating that the Mrybetriq is working and would like more samples, I advised we could send in a rx since it's working but pt states he can not afford the Mrybetriq due to him not having prescription insurance, what other medication should pt try?

## 2020-02-11 NOTE — Telephone Encounter (Signed)
Pt did some research with the pharmacist and would like to try Detrol LA if possible? Please advise

## 2020-02-11 NOTE — Telephone Encounter (Signed)
Rx sent 

## 2020-02-11 NOTE — Telephone Encounter (Signed)
Spoke with patient and advised results   

## 2020-03-15 ENCOUNTER — Other Ambulatory Visit: Payer: Self-pay

## 2020-03-15 MED ORDER — TOLTERODINE TARTRATE ER 4 MG PO CP24: 4.0000 mg | ORAL_CAPSULE | Freq: Every day | ORAL | 6 refills | Status: DC

## 2020-03-29 ENCOUNTER — Other Ambulatory Visit: Payer: Self-pay | Admitting: Family Medicine

## 2020-03-29 DIAGNOSIS — F419 Anxiety disorder, unspecified: Secondary | ICD-10-CM

## 2020-03-29 DIAGNOSIS — F3342 Major depressive disorder, recurrent, in full remission: Secondary | ICD-10-CM

## 2020-04-30 ENCOUNTER — Telehealth: Payer: Self-pay | Admitting: Family Medicine

## 2020-04-30 ENCOUNTER — Other Ambulatory Visit: Payer: Self-pay | Admitting: Family Medicine

## 2020-04-30 DIAGNOSIS — F419 Anxiety disorder, unspecified: Secondary | ICD-10-CM

## 2020-04-30 DIAGNOSIS — F3342 Major depressive disorder, recurrent, in full remission: Secondary | ICD-10-CM

## 2020-04-30 NOTE — Telephone Encounter (Signed)
Pt. Has an appointment 05/06/20.

## 2020-04-30 NOTE — Telephone Encounter (Signed)
Pt did not get the message that he needed an appt for future refills on his citalopram (CELEXA) 20 MG tablet  Pt states he is out of the medication.  I have scheduled pt for Thurs, August 5 for appt.  Pt would appreciate a refill to get him through to appt please.  [place], [place] - [place] Phone:  [phone]  Fax:  [phone]

## 2020-05-06 ENCOUNTER — Ambulatory Visit (INDEPENDENT_AMBULATORY_CARE_PROVIDER_SITE_OTHER): Payer: Medicare Other | Admitting: Family Medicine

## 2020-05-06 ENCOUNTER — Encounter: Payer: Self-pay | Admitting: Family Medicine

## 2020-05-06 ENCOUNTER — Other Ambulatory Visit: Payer: Self-pay

## 2020-05-06 DIAGNOSIS — F419 Anxiety disorder, unspecified: Secondary | ICD-10-CM

## 2020-05-06 DIAGNOSIS — F3342 Major depressive disorder, recurrent, in full remission: Secondary | ICD-10-CM | POA: Diagnosis not present

## 2020-05-06 MED ORDER — CITALOPRAM HYDROBROMIDE 20 MG PO TABS: 20.0000 mg | ORAL_TABLET | Freq: Every day | ORAL | 2 refills | Status: DC

## 2020-05-06 NOTE — Progress Notes (Signed)
Date:  05/06/2020   Name:  Joe Patton.   DOB:  [birth year]   MRN:  [id number]   Chief Complaint: Anxiety (follow up ) and Depression  Anxiety Presents for follow-up visit. Patient reports no chest pain, compulsions, confusion, decreased concentration, depressed mood, dizziness, dry mouth, excessive worry, feeling of choking, hyperventilation, impotence, insomnia, irritability, malaise, muscle tension, nausea, nervous/anxious behavior, obsessions, palpitations, panic, restlessness, shortness of breath or suicidal ideas. The severity of symptoms is mild. The quality of sleep is good.    Depression        This is a chronic problem.  The current episode started more than 1 year ago.   The onset quality is gradual.   The problem occurs intermittently.  The problem has been gradually improving since onset.  Associated symptoms include no decreased concentration, no fatigue, no helplessness, no hopelessness, does not have insomnia, not irritable, no restlessness, no decreased interest, no appetite change, no body aches, no myalgias, no headaches, no indigestion, not sad and no suicidal ideas.  Past treatments include SSRIs - Selective serotonin reuptake inhibitors.  Past medical history includes anxiety.     Lab Results  Component Value Date   CREATININE 0.84 09/04/2014   BUN 7 09/04/2014   NA 141 09/04/2014   K 3.1 (L) 09/04/2014   CL 102 09/04/2014   CO2 29 09/04/2014   No results found for: CHOL, HDL, LDLCALC, LDLDIRECT, TRIG, CHOLHDL No results found for: TSH No results found for: HGBA1C Lab Results  Component Value Date   WBC 8.1 09/04/2014   HGB 11.6 (L) 09/04/2014   HCT 34.3 (L) 09/04/2014   MCV 87.3 09/04/2014   PLT 212 09/04/2014   Lab Results  Component Value Date   ALT 21 08/25/2014   AST 21 08/25/2014   ALKPHOS 84 08/25/2014   BILITOT 0.5 08/25/2014     Review of Systems  Constitutional: Negative for appetite change, chills, fatigue, fever and irritability.   HENT: Negative for drooling, ear discharge, ear pain and sore throat.   Respiratory: Negative for cough, shortness of breath and wheezing.   Cardiovascular: Negative for chest pain, palpitations and leg swelling.  Gastrointestinal: Negative for abdominal pain, blood in stool, constipation, diarrhea and nausea.  Endocrine: Negative for polydipsia.  Genitourinary: Negative for dysuria, frequency, hematuria, impotence and urgency.  Musculoskeletal: Negative for back pain, myalgias and neck pain.  Skin: Negative for rash.  Allergic/Immunologic: Negative for environmental allergies.  Neurological: Negative for dizziness and headaches.  Hematological: Does not bruise/bleed easily.  Psychiatric/Behavioral: Positive for depression. Negative for confusion, decreased concentration and suicidal ideas. The patient is not nervous/anxious and does not have insomnia.     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   Basal cell papilloma 12/11/2018   Bilirubin in urine 12/11/2018   Chronic venous insufficiency 12/11/2018   Coxitis 12/11/2018   DDD (degenerative disc disease), lumbosacral 12/11/2018   Dermatitis, stasis 12/11/2018   Dysfunction of eustachian tube 12/11/2018   Flu vaccine need 12/11/2018   Heart murmur 12/11/2018   Hypertension 12/11/2018   Impingement syndrome of shoulder 12/11/2018   Insect bite, nonvenomous 12/11/2018   Need for vaccination 12/11/2018   Preop examination 12/11/2018   Sinus infection 12/11/2018   Recurrent major depressive disorder, in full remission ([place]) 05/21/2018   Benign prostatic hyperplasia with lower urinary tract symptoms 12/05/2017   Erectile dysfunction  12/05/2017   Condyloma acuminatum 09/04/2017   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   Presence of artificial shoulder joint 09/03/2014    Osteoarthritis of both knees 07/07/2014   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   OSA (obstructive sleep apnea) 05/08/2014   Abnormal EKG 05/06/2014   Depression 05/06/2014   Obesity 05/06/2014   Urinary urgency 07/22/2012    Allergies  Allergen Reactions   Tramadol     Anger issues   Latex Rash    Past Surgical History:  Procedure Laterality Date   CATARACT EXTRACTION     right   CHOLECYSTECTOMY  2005   COLONOSCOPY  2014   cleared for 10 yrs- Dr Allen Norris   EYE SURGERY Right    GALLBLADDER SURGERY     HIP SURGERY     left   JOINT REPLACEMENT     right and left total hips   TONSILLECTOMY     TOTAL SHOULDER ARTHROPLASTY Left 09/03/2014   Procedure: TOTAL SHOULDER ARTHROPLASTY;  Surgeon: Nita Sells, MD;  Location: [facility];  Service: Orthopedics;  Laterality: Left;    Social History   Tobacco Use   Smoking status: Former Smoker    Packs/day: 1.50    Years: 20.00    Pack years: 30.00    Types: Cigarettes    Quit date: 10/03/2007    Years since quitting: 12.6   Smokeless tobacco: Never Used   Tobacco comment: Has not resumed smoking. No need to provide pt with smoking cessation materials  Vaping Use   Vaping Use: Never used  Substance Use Topics   Alcohol use: No   Drug use: No     Medication list has been reviewed and updated.  Current Meds  Medication Sig   amLODipine (NORVASC) 5 MG tablet Take 10 mg by mouth daily. Dr Clayborn Bigness   citalopram (CELEXA) 20 MG tablet Take 1 tablet (20 mg total) by mouth daily.   labetalol (NORMODYNE) 100 MG tablet Take 100 mg by mouth daily. Dr Clayborn Bigness   losartan (COZAAR) 100 MG tablet Take 100 mg by mouth daily. Dr Clayborn Bigness   Multiple Vitamin (MULTI-VITAMINS) TABS Take 1 tablet by mouth daily.   sildenafil (REVATIO) 20 MG tablet 2-5 tabs 1 hour prior to intercourse   tolterodine (DETROL LA) 2 MG 24 hr capsule Take 2 mg by mouth daily.   vitamin E 1000 UNIT capsule Take by mouth.   [DISCONTINUED]  labetalol (NORMODYNE) 100 MG tablet Take by mouth.    PHQ 2/9 Scores 05/06/2020 06/24/2019 10/16/2018 05/21/2018  PHQ - 2 Score 0 0 0 0  PHQ- 9 Score 0 0 0 0    GAD 7 : Generalized Anxiety Score 05/06/2020 06/24/2019  Nervous, Anxious, on Edge 0 0  Control/stop worrying 0 0  Worry too much - different things 0 0  Trouble relaxing 0 0  Restless 0 0  Easily annoyed or irritable 0 0  Afraid - awful might happen 0 0  Total GAD 7 Score 0 0  Anxiety Difficulty Not difficult at all -    BP Readings from Last 3 Encounters:  05/06/20 128/80  12/24/19 130/74  12/12/19 (!) 158/80    Physical Exam Vitals and nursing note reviewed.  Constitutional:      General: He is not irritable. HENT:     Head: Normocephalic.     Right Ear: External ear normal.     Left Ear: External ear normal.     Nose: Nose normal.  Eyes:  General: No scleral icterus.       Right eye: No discharge.        Left eye: No discharge.     Conjunctiva/sclera: Conjunctivae normal.     Pupils: Pupils are equal, round, and reactive to light.  Neck:     Thyroid: No thyromegaly.     Vascular: No JVD.     Trachea: No tracheal deviation.  Cardiovascular:     Rate and Rhythm: Normal rate and regular rhythm.     Heart sounds: Normal heart sounds. No murmur heard.  No friction rub. No gallop.   Pulmonary:     Effort: No respiratory distress.     Breath sounds: Normal breath sounds. No wheezing or rales.  Abdominal:     General: Bowel sounds are normal.     Palpations: Abdomen is soft. There is no mass.     Tenderness: There is no abdominal tenderness. There is no guarding or rebound.  Musculoskeletal:        General: No tenderness. Normal range of motion.     Cervical back: Normal range of motion and neck supple.  Lymphadenopathy:     Cervical: No cervical adenopathy.  Skin:    General: Skin is warm.     Findings: No rash.  Neurological:     Mental Status: He is alert and oriented to person, place, and time.      Cranial Nerves: No cranial nerve deficit.     Deep Tendon Reflexes: Reflexes are normal and symmetric.     Wt Readings from Last 3 Encounters:  05/06/20 255 lb (115.7 kg)  12/24/19 250 lb (113.4 kg)  12/12/19 250 lb (113.4 kg)    BP 128/80    Pulse (!) 51    Ht 6\' 4"  (1.93 m)    Wt 255 lb (115.7 kg)    SpO2 96%    BMI 31.04 kg/m   Assessment and Plan: 1. Recurrent major depressive disorder, in full remission ([place]) Chronic.  Controlled.  Stable.  PHQ is 0 we will continue citalopram 20 mg once a day.  Will recheck in 6 to 9 months. - citalopram (CELEXA) 20 MG tablet; Take 1 tablet (20 mg total) by mouth daily.  Dispense: 90 tablet; Refill: 2  2. Anxiety Chronic.  Controlled.  Stable.  GAD is 0.  Continuance of citalopram 20 mg once a day will recheck in 6 to 9 months. - citalopram (CELEXA) 20 MG tablet; Take 1 tablet (20 mg total) by mouth daily.  Dispense: 90 tablet; Refill: 2

## 2020-06-17 DIAGNOSIS — L738 Other specified follicular disorders: Secondary | ICD-10-CM | POA: Diagnosis not present

## 2020-06-17 DIAGNOSIS — L821 Other seborrheic keratosis: Secondary | ICD-10-CM | POA: Diagnosis not present

## 2020-06-17 DIAGNOSIS — L400 Psoriasis vulgaris: Secondary | ICD-10-CM | POA: Diagnosis not present

## 2020-08-04 ENCOUNTER — Ambulatory Visit (INDEPENDENT_AMBULATORY_CARE_PROVIDER_SITE_OTHER): Payer: Medicare Other

## 2020-08-04 DIAGNOSIS — Z0000 Encounter for general adult medical examination without abnormal findings: Secondary | ICD-10-CM

## 2020-08-04 NOTE — Patient Instructions (Signed)
Joe Patton , Thank you for taking time to come for your Medicare Wellness Visit. I appreciate your ongoing commitment to your health goals. Please review the following plan we discussed and let me know if I can assist you in the future.   Screening recommendations/referrals: Colonoscopy: done 08/11/13. Repeat in 2024 Recommended yearly ophthalmology/optometry visit for glaucoma screening and checkup Recommended yearly dental visit for hygiene and checkup  Vaccinations: Influenza vaccine: due Pneumococcal vaccine: done 09/13/17 Tdap vaccine: due Shingles vaccine: Shingrix discussed. Please contact your pharmacy for coverage information.  Covid-19:  Done 11/19/19 & 12/17/19  Advanced directives: Please bring a copy of your health care power of attorney and living will to the office at your convenience.  Conditions/risks identified: Keep up the great work!  Next appointment: Follow up in one year for your annual wellness visit.   Preventive Care 48 Years and Older, Male Preventive care refers to lifestyle choices and visits with your health care provider that can promote health and wellness. What does preventive care include?  A yearly physical exam. This is also called an annual well check.  Dental exams once or twice a year.  Routine eye exams. Ask your health care provider how often you should have your eyes checked.  Personal lifestyle choices, including:  Daily care of your teeth and gums.  Regular physical activity.  Eating a healthy diet.  Avoiding tobacco and drug use.  Limiting alcohol use.  Practicing safe sex.  Taking low doses of aspirin every day.  Taking vitamin and mineral supplements as recommended by your health care provider. What happens during an annual well check? The services and screenings done by your health care provider during your annual well check will depend on your age, overall health, lifestyle risk factors, and family history of  disease. Counseling  Your health care provider may ask you questions about your:  Alcohol use.  Tobacco use.  Drug use.  Emotional well-being.  Home and relationship well-being.  Sexual activity.  Eating habits.  History of falls.  Memory and ability to understand (cognition).  Work and work Statistician. Screening  You may have the following tests or measurements:  Height, weight, and BMI.  Blood pressure.  Lipid and cholesterol levels. These may be checked every 5 years, or more frequently if you are over 66 years old.  Skin check.  Lung cancer screening. You may have this screening every year starting at age 36 if you have a 30-pack-year history of smoking and currently smoke or have quit within the past 15 years.  Fecal occult blood test (FOBT) of the stool. You may have this test every year starting at age 4.  Flexible sigmoidoscopy or colonoscopy. You may have a sigmoidoscopy every 5 years or a colonoscopy every 10 years starting at age 31.  Prostate cancer screening. Recommendations will vary depending on your family history and other risks.  Hepatitis C blood test.  Hepatitis B blood test.  Sexually transmitted disease (STD) testing.  Diabetes screening. This is done by checking your blood sugar (glucose) after you have not eaten for a while (fasting). You may have this done every 1-3 years.  Abdominal aortic aneurysm (AAA) screening. You may need this if you are a current or former smoker.  Osteoporosis. You may be screened starting at age 81 if you are at high risk. Talk with your health care provider about your test results, treatment options, and if necessary, the need for more tests. Vaccines  Your health care  provider may recommend certain vaccines, such as:  Influenza vaccine. This is recommended every year.  Tetanus, diphtheria, and acellular pertussis (Tdap, Td) vaccine. You may need a Td booster every 10 years.  Zoster vaccine. You may  need this after age 60.  Pneumococcal 13-valent conjugate (PCV13) vaccine. One dose is recommended after age 1.  Pneumococcal polysaccharide (PPSV23) vaccine. One dose is recommended after age 37. Talk to your health care provider about which screenings and vaccines you need and how often you need them. This information is not intended to replace advice given to you by your health care provider. Make sure you discuss any questions you have with your health care provider. Document Released: 10/15/2015 Document Revised: 06/07/2016 Document Reviewed: 07/20/2015 Elsevier Interactive Patient Education  2017 [place] Prevention in the Home Falls can cause injuries. They can happen to people of all ages. There are many things you can do to make your home safe and to help prevent falls. What can I do on the outside of my home?  Regularly fix the edges of walkways and driveways and fix any cracks.  Remove anything that might make you trip as you walk through a door, such as a raised step or threshold.  Trim any bushes or trees on the path to your home.  Use bright outdoor lighting.  Clear any walking paths of anything that might make someone trip, such as rocks or tools.  Regularly check to see if handrails are loose or broken. Make sure that both sides of any steps have handrails.  Any raised decks and porches should have guardrails on the edges.  Have any leaves, snow, or ice cleared regularly.  Use sand or salt on walking paths during winter.  Clean up any spills in your garage right away. This includes oil or grease spills. What can I do in the bathroom?  Use night lights.  Install grab bars by the toilet and in the tub and shower. Do not use towel bars as grab bars.  Use non-skid mats or decals in the tub or shower.  If you need to sit down in the shower, use a plastic, non-slip stool.  Keep the floor dry. Clean up any water that spills on the floor as soon as it  happens.  Remove soap buildup in the tub or shower regularly.  Attach bath mats securely with double-sided non-slip rug tape.  Do not have throw rugs and other things on the floor that can make you trip. What can I do in the bedroom?  Use night lights.  Make sure that you have a light by your bed that is easy to reach.  Do not use any sheets or blankets that are too big for your bed. They should not hang down onto the floor.  Have a firm chair that has side arms. You can use this for support while you get dressed.  Do not have throw rugs and other things on the floor that can make you trip. What can I do in the kitchen?  Clean up any spills right away.  Avoid walking on wet floors.  Keep items that you use a lot in easy-to-reach places.  If you need to reach something above you, use a strong step stool that has a grab bar.  Keep electrical cords out of the way.  Do not use floor polish or wax that makes floors slippery. If you must use wax, use non-skid floor wax.  Do not have throw  rugs and other things on the floor that can make you trip. What can I do with my stairs?  Do not leave any items on the stairs.  Make sure that there are handrails on both sides of the stairs and use them. Fix handrails that are broken or loose. Make sure that handrails are as long as the stairways.  Check any carpeting to make sure that it is firmly attached to the stairs. Fix any carpet that is loose or worn.  Avoid having throw rugs at the top or bottom of the stairs. If you do have throw rugs, attach them to the floor with carpet tape.  Make sure that you have a light switch at the top of the stairs and the bottom of the stairs. If you do not have them, ask someone to add them for you. What else can I do to help prevent falls?  Wear shoes that:  Do not have high heels.  Have rubber bottoms.  Are comfortable and fit you well.  Are closed at the toe. Do not wear sandals.  If you  use a stepladder:  Make sure that it is fully opened. Do not climb a closed stepladder.  Make sure that both sides of the stepladder are locked into place.  Ask someone to hold it for you, if possible.  Clearly mark and make sure that you can see:  Any grab bars or handrails.  First and last steps.  Where the edge of each step is.  Use tools that help you move around (mobility aids) if they are needed. These include:  Canes.  Walkers.  Scooters.  Crutches.  Turn on the lights when you go into a dark area. Replace any light bulbs as soon as they burn out.  Set up your furniture so you have a clear path. Avoid moving your furniture around.  If any of your floors are uneven, fix them.  If there are any pets around you, be aware of where they are.  Review your medicines with your doctor. Some medicines can make you feel dizzy. This can increase your chance of falling. Ask your doctor what other things that you can do to help prevent falls. This information is not intended to replace advice given to you by your health care provider. Make sure you discuss any questions you have with your health care provider. Document Released: 07/15/2009 Document Revised: 02/24/2016 Document Reviewed: 10/23/2014 Elsevier Interactive Patient Education  2017 [employer].

## 2020-08-04 NOTE — Progress Notes (Signed)
Subjective:   Joe Patton. is a 74 y.o. male who presents for Medicare Annual/Subsequent preventive examination.  Virtual Visit via Telephone Note  I connected with  Joe Patton. on 08/04/20 at  2:00 PM EDT by telephone and verified that I am speaking with the correct person using two identifiers.  Medicare Annual Wellness visit completed telephonically due to Covid-19 pandemic.   Location: Patient: home Provider: [facility]   I discussed the limitations, risks, security and privacy concerns of performing an evaluation and management service by telephone and the availability of in person appointments. The patient expressed understanding and agreed to proceed.  Unable to perform video visit due to video visit attempted and failed and/or patient does not have video capability.   Some vital signs may be absent or patient reported.   Clemetine Marker, LPN    Review of Systems     Cardiac Risk Factors include: advanced age (>10men, >78 women);hypertension;male gender;obesity (BMI >30kg/m2)     Objective:    There were no vitals filed for this visit. There is no height or weight on file to calculate BMI.  Advanced Directives 08/04/2020 09/13/2017 08/02/2015 08/25/2014  Does Patient Have a Medical Advance Directive? Yes Yes No Yes  Type of Advance Directive Healthcare Power of [place]  Does patient want to make changes to medical advance directive? - Yes (MAU/Ambulatory/Procedural Areas - Information given) - No - Patient declined  Copy of [place] in Chart? No - copy requested No - copy requested - No - copy requested    Current Medications (verified) Outpatient Encounter Medications as of 08/04/2020  Medication Sig  . amLODipine (NORVASC) 10 MG tablet Take 1 tablet by mouth in the morning and at bedtime.  . citalopram (CELEXA) 20 MG tablet Take 1 tablet (20 mg total) by mouth  daily.  Marland Kitchen labetalol (NORMODYNE) 100 MG tablet Take 100 mg by mouth daily. Dr Clayborn Bigness  . losartan (COZAAR) 100 MG tablet Take 100 mg by mouth daily. Dr Clayborn Bigness  . Multiple Vitamin (MULTI-VITAMINS) TABS Take 1 tablet by mouth daily.  . sildenafil (REVATIO) 20 MG tablet 2-5 tabs 1 hour prior to intercourse  . tolterodine (DETROL LA) 2 MG 24 hr capsule Take 2 mg by mouth daily.  . vitamin E 1000 UNIT capsule Take by mouth.  . [DISCONTINUED] amLODipine (NORVASC) 5 MG tablet Take 10 mg by mouth daily. Dr Clayborn Bigness   No facility-administered encounter medications on file as of 08/04/2020.    Allergies (verified) Tramadol and Latex   History: Past Medical History:  Diagnosis Date  . Actinic keratosis   . Allergic rhinitis   . Anxiety   . Arthritis   . Bilirubinuria   . BPH (benign prostatic hyperplasia)   . Degeneration, intervertebral disc, lumbosacral   . Erectile dysfunction   . Eustachian tube dysfunction   . Fibromyalgia   . GERD (gastroesophageal reflux disease)    OTC if needed Pepcid  . Hypertension   . Osteoarthrosis   . Sinusitis   . Sleep apnea    uses CPAP, will bring mask  . Viral warts    Past Surgical History:  Procedure Laterality Date  . CATARACT EXTRACTION     right  . CHOLECYSTECTOMY  2005  . COLONOSCOPY  2014   cleared for 10 yrs- Dr Allen Norris  . EYE SURGERY Right   . GALLBLADDER SURGERY    . HIP SURGERY  left  . JOINT REPLACEMENT     right and left total hips  . TONSILLECTOMY    . 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 Breast cancer Mother   . Hypertension Father   . Heart attack Father   . Stroke Father    Social History   Socioeconomic History  . Marital status: Married    Spouse name: Not on file  . Number of children: 1  . Years of education:  Not on file  . Highest education level: Not on file  Occupational History  . Occupation: retired  Tobacco Use  . Smoking status: Former Smoker    Packs/day: 1.50    Years: 20.00    Pack years: 30.00    Types: Cigarettes    Quit date: 10/03/2007    Years since quitting: 12.8  . Smokeless tobacco: Never Used  . Tobacco comment: Has not resumed smoking. No need to provide pt with smoking cessation materials  Vaping Use  . Vaping Use: Never used  Substance and Sexual Activity  . Alcohol use: No  . Drug use: No  . Sexual activity: Yes  Other Topics Concern  . Not on file  Social History Narrative  . Not on file   Social Determinants of Health   Financial Resource Strain: Low Risk   . Difficulty of Paying Living Expenses: Not hard at all  Food Insecurity: No Food Insecurity  . Worried About Charity fundraiser in the Last Year: Never true  . Ran Out of Food in the Last Year: Never true  Transportation Needs: No Transportation Needs  . Lack of Transportation (Medical): No  . Lack of Transportation (Non-Medical): No  Physical Activity: Sufficiently Active  . Days of Exercise per Week: 3 days  . Minutes of Exercise per Session: 60 min  Stress: No Stress Concern Present  . Feeling of Stress : Not at all  Social Connections: Socially Integrated  . Frequency of Communication with Friends and Family: More than three times a week  . Frequency of Social Gatherings with Friends and Family: More than three times a week  . Attends Religious Services: More than 4 times per year  . Active Member of Clubs or Organizations: Yes  . Attends Archivist Meetings: More than 4 times per year  . Marital Status: Married    Tobacco Counseling Counseling given: Not Answered Comment: Has not resumed smoking. No need to provide pt with smoking cessation materials   Clinical Intake:  Pre-visit preparation completed: Yes  Pain : No/denies pain     Nutritional Risks: None Diabetes:  No  How often do you need to have someone help you when you read instructions, pamphlets, or other written materials from your doctor or pharmacy?: 1 - Never    Interpreter Needed?: No  Information entered by :: Clemetine Marker LPN   Activities of Daily Living In your present state of health, do you have any difficulty performing the following activities: 08/04/2020 05/06/2020  Hearing? N N  Comment declines hearing aids -  Vision? N N  Difficulty concentrating or making decisions? N N  Walking or climbing stairs? N N  Dressing or bathing? N N  Doing errands, shopping? N N  Preparing Food and eating ? N -  Using the Toilet? N -  In the past six months, have you accidently leaked urine? N -  Do you have problems with loss of bowel control? N -  Managing your Medications? N -  Managing your Finances? N -  Housekeeping or managing your Housekeeping? N -  Some recent data might be hidden    Patient Care Team: Juline Patch, MD as PCP - General (Family Medicine)  Indicate any recent Medical Services you may have received from other than [facility] providers in the past year (date may be approximate).     Assessment:   This is a routine wellness examination for [place].  Hearing/Vision screen  Hearing Screening   125Hz  250Hz  500Hz  1000Hz  2000Hz  3000Hz  4000Hz  6000Hz  8000Hz   Right ear:           Left ear:           Comments: Pt denies hearing difficulty  Vision Screening Comments: Annual vision screenings done at [facility]  Dietary issues and exercise activities discussed: Current Exercise Habits: Home exercise routine, Type of exercise: strength training/weights;Other - see comments (cardio), Time (Minutes): 60, Frequency (Times/Week): 3, Weekly Exercise (Minutes/Week): 180, Intensity: Moderate, Exercise limited by: None identified  Goals    . DIET - INCREASE WATER INTAKE     Recommend to drink at least 6-8 8oz glasses of water per day.       Depression Screen PHQ 2/9 Scores  08/04/2020 05/06/2020 06/24/2019 10/16/2018 05/21/2018 09/13/2017 09/04/2017  PHQ - 2 Score 0 0 0 0 0 0 0  PHQ- 9 Score - 0 0 0 0 0 1    Fall Risk Fall Risk  08/04/2020 05/06/2020 08/27/2019 08/20/2018 09/13/2017  Falls in the past year? 1 0 0 0 Yes  Comment - - Emmi Telephone Survey: data to providers prior to load Emmi Telephone Survey: data to providers prior to load -  Number falls in past yr: 0 - - - 1  Injury with Fall? 0 - - - No  Comment - - - - Children jumped on him at church causing him to loose his balance  Risk for fall due to : No Fall Risks No Fall Risks - - -  Follow up Falls prevention discussed Falls evaluation completed - - Education provided;Falls prevention discussed;Falls evaluation completed    Any stairs in or around the home? Yes  If so, are there any without handrails? No    Home free of loose throw rugs in walkways, pet beds, electrical cords, etc? Yes  Adequate lighting in your home to reduce risk of falls? Yes   ASSISTIVE DEVICES UTILIZED TO PREVENT FALLS:  Life alert? No  Use of a cane, walker or w/c? No  Grab bars in the bathroom? Yes  Shower chair or bench in shower? No  Elevated toilet seat or a handicapped toilet? Yes   TIMED UP AND GO:  Was the test performed? No . Telephonic visit.   Cognitive Function: 6CIT deferred for 2021 AWV; pt states no memory issues      6CIT Screen 09/13/2017  What Year? 0 points  What month? 0 points  What time? 0 points  Count back from 20 0 points  Months in reverse 0 points  Repeat phrase 0 points  Total Score 0    Immunizations Immunization History  Administered Date(s) Administered  . Fluad Quad(high Dose 65+) 06/24/2019  . Influenza, High Dose Seasonal PF 07/17/2017, 07/11/2018  . Influenza, Seasonal, Injecte, Preservative Fre 07/16/2012  . Influenza,inj,Quad PF,6+ Mos 07/01/2013, 08/17/2014, 08/02/2015  .  Influenza-Unspecified 07/02/2013, 08/29/2014, 08/01/2016  . Moderna SARS-COVID-2 Vaccination  11/19/2019, 12/17/2019  . Pneumococcal Conjugate-13 09/13/2017  . Pneumococcal Polysaccharide-23 07/01/2013  . Pneumococcal-Unspecified 07/02/2013  . Zoster 03/03/2015    TDAP status: Due, Education has been provided regarding the importance of this vaccine. Advised may receive this vaccine at local pharmacy or Health Dept. Aware to provide a copy of the vaccination record if obtained from local pharmacy or Health Dept. Verbalized acceptance and understanding.   Flu vaccine status: due for 2021-2022 season  Pneumococcal vaccine status: Up to date   Covid-19 vaccine status: Completed vaccines  Qualifies for Shingles Vaccine? Yes   Zostavax completed Yes   Shingrix Completed?: No.    Education has been provided regarding the importance of this vaccine. Patient has been advised to call insurance company to determine out of pocket expense if they have not yet received this vaccine. Advised may also receive vaccine at local pharmacy or Health Dept. Verbalized acceptance and understanding.  Screening Tests Health Maintenance  Topic Date Due  . TETANUS/TDAP  Never done  . INFLUENZA VACCINE  12/30/2020 (Originally 05/02/2020)  . Hepatitis C Screening  05/06/2021 (Originally 1946-03-15)  . COLONOSCOPY  08/12/2023  . COVID-19 Vaccine  Completed  . PNA vac Low Risk Adult  Completed    Health Maintenance  Health Maintenance Due  Topic Date Due  . TETANUS/TDAP  Never done    Colorectal cancer screening: Completed 08/11/13. Repeat every 10 years  Lung Cancer Screening: (Low Dose CT Chest recommended if Age 3-80 years, 30 pack-year currently smoking OR have quit w/in 15years.) does not qualify.   Additional Screening:  Hepatitis C Screening: does qualify; postponed  Vision Screening: Recommended annual ophthalmology exams for early detection of glaucoma and other disorders of the eye. Is the patient up to date with their annual eye exam?  Yes  Who is the provider or what is the name of  the office in which the patient attends annual eye exams? [facility]  Dental Screening: Recommended annual dental exams for proper oral hygiene  Community Resource Referral / Chronic Care Management: CRR required this visit?  No   CCM required this visit?  No      Plan:     I have personally reviewed and noted the following in the patient's chart:   . Medical and social history . Use of alcohol, tobacco or illicit drugs  . Current medications and supplements . Functional ability and status . Nutritional status . Physical activity . Advanced directives . List of other physicians . Hospitalizations, surgeries, and ER visits in previous 12 months . Vitals . Screenings to include cognitive, depression, and falls . Referrals and appointments  In addition, I have reviewed and discussed with patient certain preventive protocols, quality metrics, and best practice recommendations. A written personalized care plan for preventive services as well as general preventive health recommendations were provided to patient.     Clemetine Marker, LPN   [id number]   Nurse Notes: none

## 2020-08-10 DIAGNOSIS — D485 Neoplasm of uncertain behavior of skin: Secondary | ICD-10-CM | POA: Diagnosis not present

## 2020-08-10 DIAGNOSIS — C4401 Basal cell carcinoma of skin of lip: Secondary | ICD-10-CM | POA: Diagnosis not present

## 2020-08-16 DIAGNOSIS — Z23 Encounter for immunization: Secondary | ICD-10-CM | POA: Diagnosis not present

## 2020-08-31 DIAGNOSIS — C4491 Basal cell carcinoma of skin, unspecified: Secondary | ICD-10-CM | POA: Diagnosis not present

## 2020-08-31 DIAGNOSIS — C4401 Basal cell carcinoma of skin of lip: Secondary | ICD-10-CM | POA: Diagnosis not present

## 2020-09-07 ENCOUNTER — Other Ambulatory Visit: Payer: Self-pay

## 2020-09-07 ENCOUNTER — Ambulatory Visit (INDEPENDENT_AMBULATORY_CARE_PROVIDER_SITE_OTHER): Payer: Medicare Other

## 2020-09-07 DIAGNOSIS — Z23 Encounter for immunization: Secondary | ICD-10-CM | POA: Diagnosis not present

## 2020-09-27 ENCOUNTER — Other Ambulatory Visit: Payer: Self-pay | Admitting: *Deleted

## 2020-09-27 ENCOUNTER — Telehealth: Payer: Self-pay | Admitting: Urology

## 2020-09-27 MED ORDER — TOLTERODINE TARTRATE ER 2 MG PO CP24: 2.0000 mg | ORAL_CAPSULE | Freq: Every day | ORAL | 0 refills | Status: DC

## 2020-09-27 NOTE — Telephone Encounter (Signed)
Patient called over the holiday weekend and left a voice mail message requesting a new prescription for Tolterodine to be sent to the [facility].  (I checked his chart and it does not look like anyone from our office has prescribed this medication.)  Please investigate and contact the patient.

## 2020-09-30 DIAGNOSIS — R011 Cardiac murmur, unspecified: Secondary | ICD-10-CM | POA: Diagnosis not present

## 2020-09-30 DIAGNOSIS — Z01818 Encounter for other preprocedural examination: Secondary | ICD-10-CM | POA: Diagnosis not present

## 2020-09-30 DIAGNOSIS — G4733 Obstructive sleep apnea (adult) (pediatric): Secondary | ICD-10-CM | POA: Diagnosis not present

## 2020-09-30 DIAGNOSIS — Z6832 Body mass index (BMI) 32.0-32.9, adult: Secondary | ICD-10-CM | POA: Diagnosis not present

## 2020-09-30 DIAGNOSIS — I10 Essential (primary) hypertension: Secondary | ICD-10-CM | POA: Diagnosis not present

## 2020-09-30 DIAGNOSIS — R9431 Abnormal electrocardiogram [ECG] [EKG]: Secondary | ICD-10-CM | POA: Diagnosis not present

## 2020-09-30 DIAGNOSIS — E669 Obesity, unspecified: Secondary | ICD-10-CM | POA: Diagnosis not present

## 2020-11-01 ENCOUNTER — Telehealth: Payer: Self-pay

## 2020-11-01 NOTE — Telephone Encounter (Signed)
Called and left a message.

## 2020-11-01 NOTE — Telephone Encounter (Unsigned)
Copied from [place] [phone]. Topic: General - Other >> Nov 01, 2020  3:33 PM Alanda Slim E wrote: Reason for CRM: Pt received a call from a company named [employer] / advising him that they needed him to take a saliva test to report to his pcp whether he should get a booster vaccine/ pt isnt sure if this is a real company or a scam / please advise if pcp knows of this

## 2020-11-01 NOTE — Telephone Encounter (Signed)
Please call pt and tell him there is no such thing

## 2020-11-02 NOTE — Telephone Encounter (Signed)
Pt called in and received message from [place]. He wants to tell her "Thank You."

## 2020-11-02 NOTE — Telephone Encounter (Signed)
Called pt back and confirmed scam

## 2020-11-12 ENCOUNTER — Ambulatory Visit (INDEPENDENT_AMBULATORY_CARE_PROVIDER_SITE_OTHER): Payer: Medicare Other | Admitting: Family Medicine

## 2020-11-12 ENCOUNTER — Other Ambulatory Visit: Payer: Self-pay

## 2020-11-12 ENCOUNTER — Encounter: Payer: Self-pay | Admitting: Family Medicine

## 2020-11-12 VITALS — BP 144/82 | HR 60 | Ht 75.0 in | Wt 263.0 lb

## 2020-11-12 DIAGNOSIS — F419 Anxiety disorder, unspecified: Secondary | ICD-10-CM

## 2020-11-12 DIAGNOSIS — F3342 Major depressive disorder, recurrent, in full remission: Secondary | ICD-10-CM | POA: Diagnosis not present

## 2020-11-12 MED ORDER — CITALOPRAM HYDROBROMIDE 20 MG PO TABS
20.0000 mg | ORAL_TABLET | Freq: Every day | ORAL | 1 refills | Status: DC
Start: 2020-11-12 — End: 2021-04-26

## 2020-11-12 NOTE — Progress Notes (Signed)
Date:  11/12/2020   Name:  [facility].   DOB:  April 15, 1946   MRN:  [id number]   Chief Complaint: Depression  Depression        This is a chronic problem.  The current episode started more than 1 year ago.   The onset quality is gradual.   The problem has been gradually improving since onset.  Associated symptoms include no decreased concentration, no fatigue, no helplessness, no hopelessness, does not have insomnia, not irritable, no restlessness, no decreased interest, no appetite change, no body aches, no myalgias, no headaches, no indigestion, not sad and no suicidal ideas.     The symptoms are aggravated by nothing.  Past treatments include SSRIs - Selective serotonin reuptake inhibitors.  Compliance with treatment is variable.  Previous treatment provided mild relief.   Lab Results  Component Value Date   CREATININE 0.84 09/04/2014   BUN 7 09/04/2014   NA 141 09/04/2014   K 3.1 (L) 09/04/2014   CL 102 09/04/2014   CO2 29 09/04/2014   No results found for: CHOL, HDL, LDLCALC, LDLDIRECT, TRIG, CHOLHDL No results found for: TSH No results found for: HGBA1C Lab Results  Component Value Date   WBC 8.1 09/04/2014   HGB 11.6 (L) 09/04/2014   HCT 34.3 (L) 09/04/2014   MCV 87.3 09/04/2014   PLT 212 09/04/2014   Lab Results  Component Value Date   ALT 21 08/25/2014   AST 21 08/25/2014   ALKPHOS 84 08/25/2014   BILITOT 0.5 08/25/2014     Review of Systems  Constitutional: Negative for appetite change, chills, fatigue and fever.  HENT: Negative for drooling, ear discharge, ear pain and sore throat.   Respiratory: Negative for cough, shortness of breath and wheezing.   Cardiovascular: Negative for chest pain, palpitations and leg swelling.  Gastrointestinal: Negative for abdominal pain, blood in stool, constipation, diarrhea and nausea.  Endocrine: Negative for polydipsia.  Genitourinary: Negative for dysuria, frequency, hematuria and urgency.  Musculoskeletal:  Negative for back pain, myalgias and neck pain.  Skin: Negative for rash.  Allergic/Immunologic: Negative for environmental allergies.  Neurological: Negative for dizziness and headaches.  Hematological: Does not bruise/bleed easily.  Psychiatric/Behavioral: Positive for depression. Negative for decreased concentration and suicidal ideas. The patient is not nervous/anxious and does not have insomnia.     Patient Active Problem List   Diagnosis Date Noted  . Actinic keratoses 12/11/2018  . Allergic rhinitis 12/11/2018  . 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 Basal cell papilloma 12/11/2018  . Bilirubin in urine 12/11/2018  . Chronic venous insufficiency 12/11/2018  . Coxitis 12/11/2018  . DDD (degenerative disc disease), lumbosacral 12/11/2018  . Dermatitis, stasis 12/11/2018  . Dysfunction of eustachian tube 12/11/2018  . Flu vaccine need 12/11/2018  . Heart murmur 12/11/2018  . Hypertension 12/11/2018  . Impingement syndrome of shoulder 12/11/2018  . Insect bite, nonvenomous 12/11/2018  . Need for vaccination 12/11/2018  . Preop examination 12/11/2018  . Sinus infection 12/11/2018  . Recurrent major depressive disorder, in full remission ([place]) 05/21/2018  . Benign prostatic hyperplasia with lower urinary tract symptoms 12/05/2017  . Erectile dysfunction 12/05/2017  . Condyloma acuminatum 09/04/2017  . 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 Presence of artificial shoulder joint 09/03/2014  . Osteoarthritis of both knees 07/07/2014  . 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  . OSA (obstructive sleep apnea) 05/08/2014  . Abnormal EKG 05/06/2014  . Depression 05/06/2014  . Obesity 05/06/2014  . Urinary  urgency 07/22/2012    Allergies  Allergen Reactions  . Tramadol     Anger issues  . Latex Rash    Past Surgical History:  Procedure Laterality Date  . CATARACT EXTRACTION     right  . CHOLECYSTECTOMY  2005  . COLONOSCOPY  2014   cleared for 10 yrs-  Dr Allen Norris  . EYE SURGERY Right   . GALLBLADDER SURGERY    . HIP SURGERY     left  . JOINT REPLACEMENT     right and left total hips  . TONSILLECTOMY    . TOTAL SHOULDER ARTHROPLASTY Left 09/03/2014   Procedure: TOTAL SHOULDER ARTHROPLASTY;  Surgeon: Nita Sells, MD;  Location: [facility];  Service: Orthopedics;  Laterality: Left;    Social History   Tobacco Use  . Smoking status: Former Smoker    Packs/day: 1.50    Years: 20.00    Pack years: 30.00    Types: Cigarettes    Quit date: 10/03/2007    Years since quitting: 13.1  . Smokeless tobacco: Never Used  . Tobacco comment: Has not resumed smoking. No need to provide pt with smoking cessation materials  Vaping Use  . Vaping Use: Never used  Substance Use Topics  . Alcohol use: No  . Drug use: No     Medication list has been reviewed and updated.  Current Meds  Medication Sig  . amLODipine (NORVASC) 10 MG tablet Take 1 tablet by mouth in the morning and at bedtime.  . citalopram (CELEXA) 20 MG tablet Take 1 tablet (20 mg total) by mouth daily.  Marland Kitchen labetalol (NORMODYNE) 100 MG tablet Take 100 mg by mouth daily. Dr Clayborn Bigness  . losartan (COZAAR) 100 MG tablet Take 100 mg by mouth daily. Dr Clayborn Bigness  . Multiple Vitamin (MULTI-VITAMINS) TABS Take 1 tablet by mouth daily.  . sildenafil (REVATIO) 20 MG tablet 2-5 tabs 1 hour prior to intercourse  . tolterodine (DETROL LA) 2 MG 24 hr capsule Take 1 capsule (2 mg total) by mouth daily.  . vitamin E 1000 UNIT capsule Take by mouth.    PHQ 2/9 Scores 11/12/2020 08/04/2020 05/06/2020 06/24/2019  PHQ - 2 Score 0 0 0 0  PHQ- 9 Score 0 - 0 0    GAD 7 : Generalized Anxiety Score 11/12/2020 05/06/2020 06/24/2019  Nervous, Anxious, on Edge 0 0 0  Control/stop worrying 0 0 0  Worry too much - different things 0 0 0  Trouble relaxing 0 0 0  Restless 0 0 0  Easily annoyed or irritable 0 0 0  Afraid - awful might happen 0 0 0  Total GAD 7 Score 0 0 0  Anxiety Difficulty - Not difficult  at all -    BP Readings from Last 3 Encounters:  11/12/20 (!) 144/82  05/06/20 128/80  12/24/19 130/74    Physical Exam Vitals and nursing note reviewed.  Constitutional:      General: He is not irritable. HENT:     Head: Normocephalic.     Right Ear: External ear normal.     Left Ear: External ear normal.     Nose: Nose normal.     Mouth/Throat:     Mouth: Oropharynx is clear and moist.  Eyes:     General: No scleral icterus.       Right eye: No discharge.        Left eye: No discharge.     Extraocular Movements: EOM normal.     Conjunctiva/sclera:  Conjunctivae normal.     Pupils: Pupils are equal, round, and reactive to light.  Neck:     Thyroid: No thyromegaly.     Vascular: No JVD.     Trachea: No tracheal deviation.  Cardiovascular:     Rate and Rhythm: Normal rate and regular rhythm.     Pulses: Intact distal pulses.     Heart sounds: Normal heart sounds. No murmur heard. No friction rub. No gallop.   Pulmonary:     Effort: No respiratory distress.     Breath sounds: Normal breath sounds. No wheezing, rhonchi or rales.  Abdominal:     General: Bowel sounds are normal.     Palpations: Abdomen is soft. There is no hepatosplenomegaly or mass.     Tenderness: There is no abdominal tenderness. There is no CVA tenderness, guarding or rebound.  Musculoskeletal:        General: No tenderness or edema. Normal range of motion.     Cervical back: Normal range of motion and neck supple.  Lymphadenopathy:     Cervical: No cervical adenopathy.  Skin:    General: Skin is warm.     Findings: No rash.  Neurological:     Mental Status: He is alert and oriented to person, place, and time.     Cranial Nerves: No cranial nerve deficit.     Deep Tendon Reflexes: Strength normal and reflexes are normal and symmetric.     Wt Readings from Last 3 Encounters:  11/12/20 263 lb (119.3 kg)  05/06/20 255 lb (115.7 kg)  12/24/19 250 lb (113.4 kg)    BP (!) 144/82   Pulse 60    Ht 6\' 3"  (1.905 m)   Wt 263 lb (119.3 kg)   BMI 32.87 kg/m   Assessment and Plan: 1. Recurrent major depressive disorder, in full remission ([place]) Chronic.  Controlled.  Stable.  PHQ is 0.  Continue citalopram 20 mg once a day. - citalopram (CELEXA) 20 MG tablet; Take 1 tablet (20 mg total) by mouth daily.  Dispense: 90 tablet; Refill: 1  2. Anxiety Chronic.  Controlled.  Stable.  Continue citalopram 20 mg once a day.  Will recheck in 6 months. - citalopram (CELEXA) 20 MG tablet; Take 1 tablet (20 mg total) by mouth daily.  Dispense: 90 tablet; Refill: 1  Patient's blood pressure was elevated but when I checked from cardiology visit on 09/30/2020 it was noted that there was 120/84.  I have asked him if he could have his blood pressure checked once a week at [facility] over the next 6 weeks and then call us with the readings.

## 2020-11-24 DIAGNOSIS — L570 Actinic keratosis: Secondary | ICD-10-CM | POA: Diagnosis not present

## 2020-11-24 DIAGNOSIS — L578 Other skin changes due to chronic exposure to nonionizing radiation: Secondary | ICD-10-CM | POA: Diagnosis not present

## 2020-11-24 DIAGNOSIS — Z872 Personal history of diseases of the skin and subcutaneous tissue: Secondary | ICD-10-CM | POA: Diagnosis not present

## 2020-11-24 DIAGNOSIS — L910 Hypertrophic scar: Secondary | ICD-10-CM | POA: Diagnosis not present

## 2020-11-24 DIAGNOSIS — Z85828 Personal history of other malignant neoplasm of skin: Secondary | ICD-10-CM | POA: Diagnosis not present

## 2020-12-22 ENCOUNTER — Other Ambulatory Visit: Payer: Self-pay

## 2020-12-22 ENCOUNTER — Telehealth: Payer: Self-pay

## 2020-12-22 DIAGNOSIS — R9431 Abnormal electrocardiogram [ECG] [EKG]: Secondary | ICD-10-CM | POA: Diagnosis not present

## 2020-12-22 DIAGNOSIS — Z6832 Body mass index (BMI) 32.0-32.9, adult: Secondary | ICD-10-CM | POA: Diagnosis not present

## 2020-12-22 DIAGNOSIS — R011 Cardiac murmur, unspecified: Secondary | ICD-10-CM | POA: Diagnosis not present

## 2020-12-22 DIAGNOSIS — G4733 Obstructive sleep apnea (adult) (pediatric): Secondary | ICD-10-CM | POA: Diagnosis not present

## 2020-12-22 DIAGNOSIS — H9313 Tinnitus, bilateral: Secondary | ICD-10-CM

## 2020-12-22 DIAGNOSIS — I10 Essential (primary) hypertension: Secondary | ICD-10-CM | POA: Diagnosis not present

## 2020-12-22 DIAGNOSIS — E669 Obesity, unspecified: Secondary | ICD-10-CM | POA: Diagnosis not present

## 2020-12-22 NOTE — Telephone Encounter (Signed)
Copied from [place] [id number] [id number]. Topic: General - Other >> Dec 22, 2020 10:16 AM Keene Breath wrote: Reason for CRM: Patient requests that Baxter Flattery call him regarding an appt. They spoke about for an ENT.  Please call patient at [phone]

## 2020-12-22 NOTE — Telephone Encounter (Signed)
Placed referral to ENT and pt is at another office/ cardio and b/p is back in normal range. It was slightly elevated at his last appt here. He wanted [place] to know he had it checked out

## 2020-12-22 NOTE — Progress Notes (Signed)
ENT referral placed.

## 2020-12-29 ENCOUNTER — Ambulatory Visit (INDEPENDENT_AMBULATORY_CARE_PROVIDER_SITE_OTHER): Payer: Medicare Other | Admitting: Urology

## 2020-12-29 ENCOUNTER — Other Ambulatory Visit: Payer: Self-pay

## 2020-12-29 ENCOUNTER — Encounter: Payer: Self-pay | Admitting: Urology

## 2020-12-29 VITALS — BP 159/80 | HR 60 | Ht 75.0 in | Wt 244.0 lb

## 2020-12-29 DIAGNOSIS — N401 Enlarged prostate with lower urinary tract symptoms: Secondary | ICD-10-CM

## 2020-12-29 DIAGNOSIS — N390 Urinary tract infection, site not specified: Secondary | ICD-10-CM | POA: Diagnosis not present

## 2020-12-29 LAB — URINALYSIS, COMPLETE
Bilirubin, UA: NEGATIVE
Glucose, UA: NEGATIVE
Ketones, UA: NEGATIVE
Nitrite, UA: POSITIVE — AB
Specific Gravity, UA: 1.02 (ref 1.005–1.030)
Urobilinogen, Ur: 1 mg/dL (ref 0.2–1.0)
pH, UA: 6 (ref 5.0–7.5)

## 2020-12-29 LAB — BLADDER SCAN AMB NON-IMAGING: Scan Result: 0

## 2020-12-29 LAB — MICROSCOPIC EXAMINATION

## 2020-12-29 MED ORDER — TOLTERODINE TARTRATE ER 2 MG PO CP24
2.0000 mg | ORAL_CAPSULE | Freq: Every day | ORAL | 3 refills | Status: DC
Start: 2020-12-29 — End: 2021-01-31

## 2020-12-29 NOTE — Progress Notes (Signed)
12/29/2020 10:35 AM   Joe Patton [address] Jr. 1946-01-09 [id number]  Referring provider: Juline Patch, MD [address] [place],  [place] [address]  Chief Complaint  Patient presents with  . Benign Prostatic Hypertrophy    Urologic history: 1.BPH with lower urinary tract symptoms -PVP January 2014 -has urgency and nocturia, no obstructive symptoms  -Cystoscopy 12/2019 with right lateral lobe regrowth  -TRUS volume 24 cc  -On tolterodine, tamsulosin was discontinued   2.Erectile dysfunction -On generic sildenafil  HPI: 75 y.o. male presents for annual follow-up.   Myrbetriq not covered by insurance and presently on tolterodine with improvement in urgency  He discontinued the tamsulosin and saw no worsening of his voiding symptoms  Notes occasional dysuria  Denies gross hematuria   PMH: Past Medical History:  Diagnosis Date  . Actinic keratosis   . Allergic rhinitis   . Anxiety   . Arthritis   . Bilirubinuria   . BPH (benign prostatic hyperplasia)   . Degeneration, intervertebral disc, lumbosacral   . Erectile dysfunction   . Eustachian tube dysfunction   . Fibromyalgia   . GERD (gastroesophageal reflux disease)    OTC if needed Pepcid  . Hypertension   . Osteoarthrosis   . Sinusitis   . Sleep apnea    uses CPAP, will bring mask  . Viral warts     Surgical History: Past Surgical History:  Procedure Laterality Date  . CATARACT EXTRACTION     right  . CHOLECYSTECTOMY  2005  . COLONOSCOPY  2014   cleared for 10 yrs- Dr Allen Norris  . EYE SURGERY Right   . GALLBLADDER SURGERY    . HIP SURGERY     left  . JOINT REPLACEMENT     right and left total hips  . TONSILLECTOMY    . TOTAL SHOULDER ARTHROPLASTY Left 09/03/2014   Procedure: TOTAL SHOULDER ARTHROPLASTY;  Surgeon: Nita Sells, MD;  Location: [facility];  Service: Orthopedics;  Laterality: Left;    Home Medications:  Allergies as of  12/29/2020      Reactions   Tramadol    Anger issues   Latex Rash      Medication List       Accurate as of December 29, 2020 10:35 AM. If you have any questions, ask your nurse or doctor.        amLODipine 10 MG tablet Commonly known as: NORVASC Take 1 tablet by mouth in the morning and at bedtime.   citalopram 20 MG tablet Commonly known as: CELEXA Take 1 tablet (20 mg total) by mouth daily.   labetalol 100 MG tablet Commonly known as: NORMODYNE Take 100 mg by mouth daily. Dr Clayborn Bigness   losartan 100 MG tablet Commonly known as: COZAAR Take 100 mg by mouth daily. Dr Clayborn Bigness   Multi-Vitamins Tabs Take 1 tablet by mouth daily.   sildenafil 20 MG tablet Commonly known as: REVATIO 2-5 tabs 1 hour prior to intercourse   tolterodine 2 MG 24 hr capsule Commonly known as: DETROL LA Take 1 capsule (2 mg total) by mouth daily.   vitamin E 1000 UNIT capsule Take by mouth.       Allergies:  Allergies  Allergen Reactions  . Tramadol     Anger issues  . Latex Rash    Family History: Family History  Problem Relation Age of Onset  . Breast cancer Mother   . Hypertension Father   . Heart attack Father   . Stroke Father  Social History:  reports that he quit smoking about 13 years ago. His smoking use included cigarettes. He has a 30.00 pack-year smoking history. He has never used smokeless tobacco. He reports that he does not drink alcohol and does not use drugs.   Physical Exam: BP (!) 159/80   Pulse 60   Ht 6\' 3"  (1.905 m)   Wt 244 lb (110.7 kg)   BMI 30.50 kg/m   Constitutional:  Alert and oriented, No acute distress. HEENT: [place] AT, moist mucus membranes.  Trachea midline, no masses. Cardiovascular: No clubbing, cyanosis, or edema. Respiratory: Normal respiratory effort, no increased work of breathing.   Laboratory Data:  Urinalysis Dipstick 2+ blood/1+ protein/2+ leukocytes/nitrite positive Microscopy 11-30 WBC/3-10 RBC  Assessment & Plan:     1. Benign prostatic hyperplasia with lower urinary tract symptoms, symptom details unspecified  Bladder scan PVR 0 mL  No bothersome obstructive symptoms and no worsening off tamsulosin  Tolterodine was refilled  Continue annual follow-up  2.  Pyuria  No significant PVR  Urine culture ordered  Since he does have occasional dysuria and urgency will treat once culture returns to see if symptoms improve    [name], MD  [facility] [address], [place], [place] [address] [phone]

## 2021-01-03 ENCOUNTER — Telehealth: Payer: Self-pay

## 2021-01-03 LAB — CULTURE, URINE COMPREHENSIVE

## 2021-01-03 MED ORDER — SULFAMETHOXAZOLE-TRIMETHOPRIM 800-160 MG PO TABS: 1.0000 | ORAL_TABLET | Freq: Two times a day (BID) | ORAL | 0 refills | Status: AC

## 2021-01-03 NOTE — Telephone Encounter (Signed)
Patient called wanting to know the results of his urine culture. He states his symptoms are not worse but are not any better. He would like an abx sent in if possible. He denies fever, chills, nausea, and vomiting

## 2021-01-03 NOTE — Telephone Encounter (Signed)
Patient notified

## 2021-01-03 NOTE — Telephone Encounter (Signed)
Antibiotic Rx sent to pharmacy

## 2021-01-20 ENCOUNTER — Ambulatory Visit (INDEPENDENT_AMBULATORY_CARE_PROVIDER_SITE_OTHER): Payer: Medicare Other | Admitting: Physician Assistant

## 2021-01-20 ENCOUNTER — Encounter: Payer: Self-pay | Admitting: Physician Assistant

## 2021-01-20 ENCOUNTER — Ambulatory Visit
Admission: RE | Admit: 2021-01-20 | Discharge: 2021-01-20 | Disposition: A | Discharge status: Home / Self Care | Payer: Medicare Other | Source: Ambulatory Visit | Attending: Physician Assistant | Admitting: Physician Assistant

## 2021-01-20 ENCOUNTER — Other Ambulatory Visit: Payer: Self-pay

## 2021-01-20 ENCOUNTER — Other Ambulatory Visit: Payer: Self-pay | Admitting: Physician Assistant

## 2021-01-20 ENCOUNTER — Ambulatory Visit
Admission: RE | Admit: 2021-01-20 | Discharge: 2021-01-20 | Disposition: A | Discharge status: Home / Self Care | Payer: Medicare Other | Attending: Physician Assistant | Admitting: Physician Assistant

## 2021-01-20 VITALS — BP 159/68 | HR 68 | Ht 75.0 in | Wt 245.0 lb

## 2021-01-20 DIAGNOSIS — R300 Dysuria: Secondary | ICD-10-CM | POA: Insufficient documentation

## 2021-01-20 DIAGNOSIS — R109 Unspecified abdominal pain: Secondary | ICD-10-CM | POA: Diagnosis not present

## 2021-01-20 LAB — BLADDER SCAN AMB NON-IMAGING

## 2021-01-20 MED ORDER — LEVOFLOXACIN 500 MG PO TABS: 500.0000 mg | ORAL_TABLET | Freq: Every day | ORAL | 0 refills | Status: DC

## 2021-01-20 NOTE — Progress Notes (Signed)
01/20/2021 5:50 PM   [place]. [birth year] [id number]  CC: Chief Complaint  Patient presents with  . Dysuria    HPI: Joe Patton. is a 75 y.o. male with PMH BPH with LUTS on tolterodine and Flomax and ED on sildenafil who presents today for evaluation of possible UTI.  He was seen in clinic most recently by Dr. Bernardo Heater on 12/29/2020 for annual follow-up and noted occasional dysuria at that time.  Urine culture grew penicillin resistant Staph epidermidis and he was treated with Bactrim DS twice daily x10 days.  Today he reports his dysuria never really resolved on Bactrim.  He also reports straining with urination and pain with straining.  He denies flank pain, gross hematuria, fever, chills, nausea, and vomiting.  He does have a history of nephrolithiasis.  In-office UA today positive for 3+ blood, 2+ protein, 2.0 EU/DL urobilinogen, nitrites, and 1+ leukocyte esterase; urine microscopy with >30 WBCs/HPF, >30 RBCs/HPF, and calcium oxalate crystals. PVR 65mL.  PMH: Past Medical History:  Diagnosis Date  . Actinic keratosis   . Allergic rhinitis   . Anxiety   . Arthritis   . Bilirubinuria   . BPH (benign prostatic hyperplasia)   . Degeneration, intervertebral disc, lumbosacral   . Erectile dysfunction   . Eustachian tube dysfunction   . Fibromyalgia   . GERD (gastroesophageal reflux disease)    OTC if needed Pepcid  . Hypertension   . Osteoarthrosis   . Sinusitis   . Sleep apnea    uses CPAP, will bring mask  . Viral warts     Surgical History: Past Surgical History:  Procedure Laterality Date  . CATARACT EXTRACTION     right  . CHOLECYSTECTOMY  2005  . COLONOSCOPY  2014   cleared for 10 yrs- Dr Allen Norris  . EYE SURGERY Right   . GALLBLADDER SURGERY    . HIP SURGERY     left  . JOINT REPLACEMENT     right and left total hips  . TONSILLECTOMY    . TOTAL SHOULDER ARTHROPLASTY Left 09/03/2014   Procedure: TOTAL SHOULDER ARTHROPLASTY;  Surgeon:  Nita Sells, MD;  Location: [facility];  Service: Orthopedics;  Laterality: Left;    Home Medications:  Allergies as of 01/20/2021      Reactions   Tramadol    Anger issues   Latex Rash      Medication List       Accurate as of January 20, 2021  5:50 PM. If you have any questions, ask your nurse or doctor.        amLODipine 10 MG tablet Commonly known as: NORVASC Take 1 tablet by mouth in the morning and at bedtime.   citalopram 20 MG tablet Commonly known as: CELEXA Take 1 tablet (20 mg total) by mouth daily.   labetalol 100 MG tablet Commonly known as: NORMODYNE Take 100 mg by mouth daily. Dr Clayborn Bigness   levofloxacin 500 MG tablet Commonly known as: LEVAQUIN Take 1 tablet (500 mg total) by mouth daily. Started by: Debroah Loop, PA-C   losartan 100 MG tablet Commonly known as: COZAAR Take 100 mg by mouth daily. Dr Clayborn Bigness   Multi-Vitamins Tabs Take 1 tablet by mouth daily.   sildenafil 20 MG tablet Commonly known as: REVATIO 2-5 tabs 1 hour prior to intercourse   tolterodine 2 MG 24 hr capsule Commonly known as: DETROL LA Take 1 capsule (2 mg total) by mouth daily.   vitamin E 1000  UNIT capsule Take by mouth.       Allergies:  Allergies  Allergen Reactions  . Tramadol     Anger issues  . Latex Rash    Family History: Family History  Problem Relation Age of Onset  . Breast cancer Mother   . Hypertension Father   . Heart attack Father   . Stroke Father     Social History:   reports that he quit smoking about 13 years ago. His smoking use included cigarettes. He has a 30.00 pack-year smoking history. He has never used smokeless tobacco. He reports that he does not drink alcohol and does not use drugs.  Physical Exam: BP (!) 159/68   Pulse 68   Ht 6\' 3"  (1.905 m)   Wt 245 lb (111.1 kg)   BMI 30.62 kg/m   Constitutional:  Alert and oriented, no acute distress, nontoxic appearing HEENT: [place], AT Cardiovascular: No clubbing,  cyanosis, or edema Respiratory: Normal respiratory effort, no increased work of breathing Skin: No rashes, bruises or suspicious lesions Neurologic: Grossly intact, no focal deficits, moving all 4 extremities Psychiatric: Normal mood and affect  Laboratory Data: Results for orders placed or performed in visit on 01/20/21  Microscopic Examination   Urine  Result Value Ref Range   WBC, UA >30 (A) 0 - 5 /hpf   RBC >30 (A) 0 - 2 /hpf   Epithelial Cells (non renal) 0-10 0 - 10 /hpf   Renal Epithel, UA 0-10 (A) None seen /hpf   Casts Present (A) None seen /lpf   Cast Type Hyaline casts N/A   Crystals Present (A) N/A   Crystal Type Calcium Oxalate N/A   Bacteria, UA Few None seen/Few  Urinalysis, Complete  Result Value Ref Range   Specific Gravity, UA >1.030 (H) 1.005 - 1.030   pH, UA 6.0 5.0 - 7.5   Color, UA Yellow Yellow   Appearance Ur Cloudy (A) Clear   Leukocytes,UA 1+ (A) Negative   Protein,UA 2+ (A) Negative/Trace   Glucose, UA Negative Negative   Ketones, UA Negative Negative   RBC, UA 4+ (A) Negative   Bilirubin, UA Negative Negative   Urobilinogen, Ur 2.0 (H) 0.2 - 1.0 mg/dL   Nitrite, UA Positive (A) Negative   Microscopic Examination See below:   BLADDER SCAN AMB NON-IMAGING  Result Value Ref Range   Scan Result 16mL    Assessment & Plan:   1. Dysuria Dysuria, painful straining, and grossly infected UA today with a recent history of positive urine culture treated with culture appropriate antibiotics and urolithiasis.  Differential includes recurrent versus persistent UTI, chronic bacterial prostatitis, or acute cystitis in the setting of acute stone episode.  VSS today, patient is nontoxic-appearing, and no evidence of renal colic.  I have low suspicion for infected, obstructing kidney stone.  I recommended that he pursue KUB today for evaluation of possible stone, start empiric Levaquin, and I am sending his urine for culture today. - Urinalysis, Complete -  CULTURE, URINE COMPREHENSIVE - BLADDER SCAN AMB NON-IMAGING - levofloxacin (LEVAQUIN) 500 MG tablet; Take 1 tablet (500 mg total) by mouth daily.  Dispense: 7 tablet; Refill: 0  Return for Will call with results.  Debroah Loop, PA-C  [facility] [address] [place], [place] [address] [phone]

## 2021-01-21 ENCOUNTER — Telehealth: Payer: Self-pay

## 2021-01-21 NOTE — Telephone Encounter (Signed)
Pt informed of information below. Pt gave verbal understanding. Cysto scheduled.

## 2021-01-21 NOTE — Telephone Encounter (Signed)
-----   Message from Debroah Loop, [place] sent at 01/21/2021 11:34 AM EDT ----- Please contact the patient and inform him that it doesn't appear that he's passing a kidney stone, but he may have a bladder stone, which are calcifications that originate inside the bladder (instead of in the kidney). Please schedule him for a cystoscopy with Dr. Bernardo Heater for further evaluation of this. He should continue antibiotics as prescribed. ----- Message ----- From: Interface, Rad Results In Sent: 01/21/2021   9:43 AM EDT To: Debroah Loop, PA-C

## 2021-01-22 LAB — URINALYSIS, COMPLETE
Bilirubin, UA: NEGATIVE
Glucose, UA: NEGATIVE
Ketones, UA: NEGATIVE
Nitrite, UA: POSITIVE — AB
Specific Gravity, UA: >1.03 — ABNORMAL HIGH (ref 1.005–1.030)
Urobilinogen, Ur: 2 mg/dL — ABNORMAL HIGH (ref 0.2–1.0)
pH, UA: 6 (ref 5.0–7.5)

## 2021-01-22 LAB — MICROSCOPIC EXAMINATION
RBC, Urine: >30 /hpf — AB (ref 0–2)
WBC, UA: >30 /hpf — AB (ref 0–5)

## 2021-01-25 LAB — CULTURE, URINE COMPREHENSIVE

## 2021-01-27 ENCOUNTER — Other Ambulatory Visit: Payer: Medicare Other | Admitting: Urology

## 2021-01-30 NOTE — Progress Notes (Shared)
01/30/2021  10:38 PM   Joe Patton. [birth year] [id number]  Referring provider: Juline Patch, MD [address] [place],  [place] [address] No chief complaint on file.   HPI: Joe Patton. is a 75 y.o. male with a personal history of ***, who presents today for    PMH: Past Medical History:  Diagnosis Date  . Actinic keratosis   . Allergic rhinitis   . Anxiety   . Arthritis   . Bilirubinuria   . BPH (benign prostatic hyperplasia)   . Degeneration, intervertebral disc, lumbosacral   . Erectile dysfunction   . Eustachian tube dysfunction   . Fibromyalgia   . GERD (gastroesophageal reflux disease)    OTC if needed Pepcid  . Hypertension   . Osteoarthrosis   . Sinusitis   . Sleep apnea    uses CPAP, will bring mask  . Viral warts     Surgical History: Past Surgical History:  Procedure Laterality Date  . CATARACT EXTRACTION     right  . CHOLECYSTECTOMY  2005  . COLONOSCOPY  2014   cleared for 10 yrs- Dr Joe Patton  . EYE SURGERY Right   . GALLBLADDER SURGERY    . HIP SURGERY     left  . JOINT REPLACEMENT     right and left total hips  . TONSILLECTOMY    . TOTAL SHOULDER ARTHROPLASTY Left 09/03/2014   Procedure: TOTAL SHOULDER ARTHROPLASTY;  Surgeon: Joe Sells, MD;  Location: [facility];  Service: Orthopedics;  Laterality: Left;    Home Medications:  Allergies as of 01/31/2021      Reactions   Tramadol    Anger issues   Latex Rash      Medication List       Accurate as of Jan 30, 2021 10:38 PM. If you have any questions, ask your nurse or doctor.        amLODipine 10 MG tablet Commonly known as: NORVASC Take 1 tablet by mouth in the morning and at bedtime.   citalopram 20 MG tablet Commonly known as: CELEXA Take 1 tablet (20 mg total) by mouth daily.   labetalol 100 MG tablet Commonly known as: NORMODYNE Take 100 mg by mouth daily. Dr Joe Patton   levofloxacin 500 MG tablet Commonly known as: LEVAQUIN Take 1 tablet  (500 mg total) by mouth daily.   losartan 100 MG tablet Commonly known as: COZAAR Take 100 mg by mouth daily. Dr Joe Patton   Multi-Vitamins Tabs Take 1 tablet by mouth daily.   sildenafil 20 MG tablet Commonly known as: REVATIO 2-5 tabs 1 hour prior to intercourse   tolterodine 2 MG 24 hr capsule Commonly known as: DETROL LA Take 1 capsule (2 mg total) by mouth daily.   vitamin E 1000 UNIT capsule Take by mouth.       Allergies: Allergies  Allergen Reactions  . Tramadol     Anger issues  . Latex Rash    Family History: Family History  Problem Relation Age of Onset  . Breast cancer Mother   . Hypertension Father   . Heart attack Father   . Stroke Father     Social History:   reports that he quit smoking about 13 years ago. His smoking use included cigarettes. He has a 30.00 pack-year smoking history. He has never used smokeless tobacco. He reports that he does not drink alcohol and does not use drugs.  ROS: Pertinent ROS in HPI.  Physical Exam:  There were no vitals taken for this visit.  Constitutional:  Alert and oriented, No acute distress. HEENT: [place] AT, moist mucus membranes.  Trachea midline, no masses. Cardiovascular: No clubbing, cyanosis, or edema. Respiratory: Normal respiratory effort, no increased work of breathing. ***GI: Abdomen is soft, non tender, non distended, no abdominal masses. Liver and spleen not palpable.  No hernias appreciated.  Stool sample for occult testing is not indicated. ***GU: Phallus circumcised/uncircumcised without lesions, testes descended bilaterally without masses or tenderness, spermatic cord/epididymis palpably normal bilaterally.  Vasa palpable bilaterally ***Rectal: Prostate is *** grams, no nodules, non-tender, with normal sphincter tone. Skin: No rashes, bruises or suspicious lesions. Neurologic: Grossly intact, no focal deficits, moving all 4 extremities. Psychiatric: Normal mood and affect.  Laboratory Data:  Lab  Results  Component Value Date   CREATININE 0.84 09/04/2014     No results found for: PSA   No results found for: TSH   No results found for: TESTOSTERONE   No results found for: HGBA1C   I have reviewed the labs.  Urinalysis    I have reviewed the labs.  Urine Culture:   I have reviewed the labs.  Pertinent Imaging:   Results for orders placed during the hospital encounter of 01/20/21  DG Abd 1 View  Narrative CLINICAL DATA:  Pain with urination, history of nephrolithiasis  EXAM: ABDOMEN - 1 VIEW  COMPARISON:  None.  FINDINGS: Multiple calcifications overlie the kidneys bilaterally. There is a 3 cm area of calcification overlying the region of the bladder. Partially imaged bilateral total hip arthroplasties.  IMPRESSION: Multiple bilateral renal calculi. 3 cm area of calcification overlying the region of the bladder could reflect a large calculus.   Electronically Signed By: Joe Patton M.D. On: 01/21/2021 09:41    I have personally reviewed the images and agree with radiologist interpretation.    Assessment & Plan:      Follow Up:  No follow-ups on file.   [facility] [address], [place], [place] [address] [phone]

## 2021-01-31 ENCOUNTER — Other Ambulatory Visit: Payer: Self-pay

## 2021-01-31 ENCOUNTER — Ambulatory Visit: Payer: Medicare Other | Admitting: Urology

## 2021-01-31 ENCOUNTER — Ambulatory Visit (INDEPENDENT_AMBULATORY_CARE_PROVIDER_SITE_OTHER): Payer: Medicare Other | Admitting: Urology

## 2021-01-31 ENCOUNTER — Encounter: Payer: Self-pay | Admitting: Urology

## 2021-01-31 VITALS — BP 166/83 | HR 66 | Ht 75.0 in | Wt 245.0 lb

## 2021-01-31 DIAGNOSIS — N210 Calculus in bladder: Secondary | ICD-10-CM

## 2021-01-31 DIAGNOSIS — R3915 Urgency of urination: Secondary | ICD-10-CM

## 2021-01-31 DIAGNOSIS — N401 Enlarged prostate with lower urinary tract symptoms: Secondary | ICD-10-CM

## 2021-01-31 DIAGNOSIS — R300 Dysuria: Secondary | ICD-10-CM

## 2021-01-31 MED ORDER — GEMTESA 75 MG PO TABS: 75.0000 mg | ORAL_TABLET | Freq: Every day | ORAL | 0 refills | Status: DC

## 2021-02-01 ENCOUNTER — Encounter: Payer: Self-pay | Admitting: Urology

## 2021-02-01 DIAGNOSIS — H903 Sensorineural hearing loss, bilateral: Secondary | ICD-10-CM | POA: Diagnosis not present

## 2021-02-01 DIAGNOSIS — H9312 Tinnitus, left ear: Secondary | ICD-10-CM | POA: Diagnosis not present

## 2021-02-01 LAB — URINALYSIS, COMPLETE
Bilirubin, UA: NEGATIVE
Glucose, UA: NEGATIVE
Ketones, UA: NEGATIVE
Nitrite, UA: NEGATIVE
Specific Gravity, UA: 1.02 (ref 1.005–1.030)
Urobilinogen, Ur: 1 mg/dL (ref 0.2–1.0)
pH, UA: 7 (ref 5.0–7.5)

## 2021-02-01 LAB — MICROSCOPIC EXAMINATION: RBC, Urine: >30 /hpf — AB (ref 0–2)

## 2021-02-01 NOTE — Progress Notes (Signed)
01/31/2021 7:33 AM   Joe Patton [address] [place]. 09/07/1946 [id number]  Referring provider: Juline Patch, MD [address] [place],  [place] [address]  Chief Complaint  Patient presents with  . Cysto    Urologic history: 1.BPH with lower urinary tract symptoms -PVP January 2014 -has urgency and nocturia, no obstructive symptoms             -Cystoscopy 12/2019 with right lateral lobe regrowth             -TRUS volume 24 cc             -On tolterodine, tamsulosin was discontinued   2.Erectile dysfunction -On generic sildenafil   HPI: 75 y.o. male presents for follow-up to discuss KUB results.   Saw Thomas Hoff 01/20/2021 complaining of dysuria  Prior urine culture 3/22 grew staph epidermis and he was treated with Septra DS  On his follow-up visit he was complaining of persistent dysuria, straining to urinate and urinalysis showed significant pyuria, microhematuria and calcium oxalate crystals  KUB was ordered which showed calcification overlying the bladder measuring ~ 3 cm  He continues with urinary frequency and urgency.  Cystoscopy performed 2021 showed no bladder calculi.   PMH: Past Medical History:  Diagnosis Date  . Actinic keratosis   . Allergic rhinitis   . Anxiety   . Arthritis   . Bilirubinuria   . BPH (benign prostatic hyperplasia)   . Degeneration, intervertebral disc, lumbosacral   . Erectile dysfunction   . Eustachian tube dysfunction   . Fibromyalgia   . GERD (gastroesophageal reflux disease)    OTC if needed Pepcid  . Hypertension   . Osteoarthrosis   . Sinusitis   . Sleep apnea    uses CPAP, will bring mask  . Viral warts     Surgical History: Past Surgical History:  Procedure Laterality Date  . CATARACT EXTRACTION     right  . CHOLECYSTECTOMY  2005  . COLONOSCOPY  2014   cleared for 10 yrs- Dr Allen Norris  . EYE SURGERY Right   . GALLBLADDER SURGERY    . HIP SURGERY     left  .  JOINT REPLACEMENT     right and left total hips  . TONSILLECTOMY    . 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 If you have any questions, ask your nurse or doctor.        STOP taking these medications   levofloxacin 500 MG tablet Commonly known as: LEVAQUIN Stopped by: [name], MD   tolterodine 2 MG 24 hr capsule Commonly known as: DETROL LA Stopped by: [name], MD     TAKE these medications   amLODipine 10 MG tablet Commonly known as: NORVASC Take 1 tablet by mouth in the morning and at bedtime.   citalopram 20 MG tablet Commonly known as: CELEXA Take 1 tablet (20 mg total) by mouth daily.   Gemtesa 75 MG Tabs Generic drug: Vibegron Take 75 mg by mouth daily. Started by: [name], MD   labetalol 100 MG tablet Commonly known  as: NORMODYNE Take 100 mg by mouth daily. Dr Clayborn Bigness   losartan 100 MG tablet Commonly known as: COZAAR Take 100 mg by mouth daily. Dr Clayborn Bigness   Multi-Vitamins Tabs Take 1 tablet by mouth daily.   sildenafil 20 MG tablet Commonly known as: REVATIO 2-5 tabs 1 hour prior to intercourse   tamsulosin 0.4 MG Caps capsule Commonly known as: FLOMAX Take 0.4 mg by mouth.   vitamin E 1000 UNIT capsule Take by mouth.       Allergies:  Allergies  Allergen Reactions  . Tramadol     Anger issues  . Latex Rash    Family History: Family History  Problem Relation Age of Onset  . Breast cancer Mother   . Hypertension Father   . Heart attack Father   . Stroke Father     Social History:  reports that he quit smoking about 13 years ago. His smoking use included cigarettes. He has a 30.00 pack-year  smoking history. He has never used smokeless tobacco. He reports that he does not drink alcohol and does not use drugs.   Physical Exam: BP (!) 166/83   Pulse 66   Ht 6\' 3"  (1.905 m)   Wt 245 lb (111.1 kg)   BMI 30.62 kg/m   Constitutional:  Alert and oriented, No acute distress. HEENT: [place] AT, moist mucus membranes.  Trachea midline, no masses. Cardiovascular: No clubbing, cyanosis, or edema.  RRR Respiratory: Normal respiratory effort, clear GI: Abdomen is soft, nontender, nondistended, no abdominal masses GU: No CVA tenderness Lymph: No cervical or inguinal lymphadenopathy. Skin: No rashes, bruises or suspicious lesions. Neurologic: Grossly intact, no focal deficits, moving all 4 extremities. Psychiatric: Normal mood and affect.   Pertinent Imaging: Images personally reviewed and interpreted  DG Abd 1 View  Narrative CLINICAL DATA:  Pain with urination, history of nephrolithiasis  EXAM: ABDOMEN - 1 VIEW  COMPARISON:  None.  FINDINGS: Multiple calcifications overlie the kidneys bilaterally. There is a 3 cm area of calcification overlying the region of the bladder. Partially imaged bilateral total hip arthroplasties.  IMPRESSION: Multiple bilateral renal calculi. 3 cm area of calcification overlying the region of the bladder could reflect a large calculus.   Electronically Signed By: Macy Mis M.D. On: 01/21/2021 09:41   Assessment & Plan:    1.  Bladder calculus  Large bladder calculus on KUB  Findings were discussed in detail and recommend scheduling cystoscopy litholapaxy  The procedure was discussed including potential risks of bleeding, infection, bladder injury.  The possible need for a catheter postoperatively was discussed.  Prior TRUS showed a prostate volume of only 24 cm.  All questions were answered and he desires to proceed   [name], [place] [address] [place], [place]  [address] [phone]

## 2021-02-01 NOTE — H&P (View-Only) (Signed)
01/31/2021 7:33 AM   Joe Patton [address] [place]. 09/07/1946 [id number]  Referring provider: Juline Patch, MD [address] [place],  [place] [address]  Chief Complaint  Patient presents with  . Cysto    Urologic history: 1.BPH with lower urinary tract symptoms -PVP January 2014 -has urgency and nocturia, no obstructive symptoms             -Cystoscopy 12/2019 with right lateral lobe regrowth             -TRUS volume 24 cc             -On tolterodine, tamsulosin was discontinued   2.Erectile dysfunction -On generic sildenafil   HPI: 75 y.o. male presents for follow-up to discuss KUB results.   Saw Thomas Hoff 01/20/2021 complaining of dysuria  Prior urine culture 3/22 grew staph epidermis and he was treated with Septra DS  On his follow-up visit he was complaining of persistent dysuria, straining to urinate and urinalysis showed significant pyuria, microhematuria and calcium oxalate crystals  KUB was ordered which showed calcification overlying the bladder measuring ~ 3 cm  He continues with urinary frequency and urgency.  Cystoscopy performed 2021 showed no bladder calculi.   PMH: Past Medical History:  Diagnosis Date  . Actinic keratosis   . Allergic rhinitis   . Anxiety   . Arthritis   . Bilirubinuria   . BPH (benign prostatic hyperplasia)   . Degeneration, intervertebral disc, lumbosacral   . Erectile dysfunction   . Eustachian tube dysfunction   . Fibromyalgia   . GERD (gastroesophageal reflux disease)    OTC if needed Pepcid  . Hypertension   . Osteoarthrosis   . Sinusitis   . Sleep apnea    uses CPAP, will bring mask  . Viral warts     Surgical History: Past Surgical History:  Procedure Laterality Date  . CATARACT EXTRACTION     right  . CHOLECYSTECTOMY  2005  . COLONOSCOPY  2014   cleared for 10 yrs- Dr Allen Norris  . EYE SURGERY Right   . GALLBLADDER SURGERY    . HIP SURGERY     left  .  JOINT REPLACEMENT     right and left total hips  . TONSILLECTOMY    . 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 If you have any questions, ask your nurse or doctor.        STOP taking these medications   levofloxacin 500 MG tablet Commonly known as: LEVAQUIN Stopped by: [name], MD   tolterodine 2 MG 24 hr capsule Commonly known as: DETROL LA Stopped by: [name], MD     TAKE these medications   amLODipine 10 MG tablet Commonly known as: NORVASC Take 1 tablet by mouth in the morning and at bedtime.   citalopram 20 MG tablet Commonly known as: CELEXA Take 1 tablet (20 mg total) by mouth daily.   Gemtesa 75 MG Tabs Generic drug: Vibegron Take 75 mg by mouth daily. Started by: [name], MD   labetalol 100 MG tablet Commonly known  as: NORMODYNE Take 100 mg by mouth daily. Dr Clayborn Bigness   losartan 100 MG tablet Commonly known as: COZAAR Take 100 mg by mouth daily. Dr Clayborn Bigness   Multi-Vitamins Tabs Take 1 tablet by mouth daily.   sildenafil 20 MG tablet Commonly known as: REVATIO 2-5 tabs 1 hour prior to intercourse   tamsulosin 0.4 MG Caps capsule Commonly known as: FLOMAX Take 0.4 mg by mouth.   vitamin E 1000 UNIT capsule Take by mouth.       Allergies:  Allergies  Allergen Reactions  . Tramadol     Anger issues  . Latex Rash    Family History: Family History  Problem Relation Age of Onset  . Breast cancer Mother   . Hypertension Father   . Heart attack Father   . Stroke Father     Social History:  reports that he quit smoking about 13 years ago. His smoking use included cigarettes. He has a 30.00 pack-year  smoking history. He has never used smokeless tobacco. He reports that he does not drink alcohol and does not use drugs.   Physical Exam: BP (!) 166/83   Pulse 66   Ht 6\' 3"  (1.905 m)   Wt 245 lb (111.1 kg)   BMI 30.62 kg/m   Constitutional:  Alert and oriented, No acute distress. HEENT: [place] AT, moist mucus membranes.  Trachea midline, no masses. Cardiovascular: No clubbing, cyanosis, or edema.  RRR Respiratory: Normal respiratory effort, clear GI: Abdomen is soft, nontender, nondistended, no abdominal masses GU: No CVA tenderness Lymph: No cervical or inguinal lymphadenopathy. Skin: No rashes, bruises or suspicious lesions. Neurologic: Grossly intact, no focal deficits, moving all 4 extremities. Psychiatric: Normal mood and affect.   Pertinent Imaging: Images personally reviewed and interpreted  DG Abd 1 View  Narrative CLINICAL DATA:  Pain with urination, history of nephrolithiasis  EXAM: ABDOMEN - 1 VIEW  COMPARISON:  None.  FINDINGS: Multiple calcifications overlie the kidneys bilaterally. There is a 3 cm area of calcification overlying the region of the bladder. Partially imaged bilateral total hip arthroplasties.  IMPRESSION: Multiple bilateral renal calculi. 3 cm area of calcification overlying the region of the bladder could reflect a large calculus.   Electronically Signed By: Macy Mis M.D. On: 01/21/2021 09:41   Assessment & Plan:    1.  Bladder calculus  Large bladder calculus on KUB  Findings were discussed in detail and recommend scheduling cystoscopy litholapaxy  The procedure was discussed including potential risks of bleeding, infection, bladder injury.  The possible need for a catheter postoperatively was discussed.  Prior TRUS showed a prostate volume of only 24 cm.  All questions were answered and he desires to proceed   [name], [facility] [address] [place], [place]  [address] [phone]

## 2021-02-02 ENCOUNTER — Other Ambulatory Visit: Payer: Medicare Other | Admitting: Urology

## 2021-02-03 ENCOUNTER — Telehealth: Payer: Self-pay | Admitting: Urology

## 2021-02-03 LAB — CULTURE, URINE COMPREHENSIVE

## 2021-02-03 NOTE — Telephone Encounter (Signed)
All of the pain medications are codeine derivatives.  He is okay to take ibuprofen or nonsteroidals for pain.

## 2021-02-03 NOTE — Telephone Encounter (Signed)
I called pt. To give him surgery date. Pt. States he needs pain medication prescription and he does not tolerate tramadol nor codeine products. He is not sure what to take for pain due to no anticoagulants 7 days prior to surgery. Pt. Also request a call from the office once prescription is sent in.

## 2021-02-04 NOTE — Telephone Encounter (Signed)
Called pt no answer, left detailed message informing pt of information below. Advised pt to call back for questions or concerns.

## 2021-02-07 ENCOUNTER — Other Ambulatory Visit: Payer: Self-pay | Admitting: Urology

## 2021-02-07 DIAGNOSIS — N210 Calculus in bladder: Secondary | ICD-10-CM

## 2021-02-10 ENCOUNTER — Other Ambulatory Visit: Payer: Self-pay

## 2021-02-10 ENCOUNTER — Other Ambulatory Visit
Admission: RE | Admit: 2021-02-10 | Discharge: 2021-02-10 | Disposition: A | Discharge status: Home / Self Care | Payer: Medicare Other | Source: Ambulatory Visit | Attending: Urology | Admitting: Urology

## 2021-02-10 HISTORY — DX: Cardiac murmur, unspecified: R01.1

## 2021-02-10 NOTE — Patient Instructions (Signed)
Your procedure is scheduled on: Tuesday Feb 15, 2021. Report to Day Surgery inside [facility] 2nd floor (Stop by admissions desk first before getting on elevator). To find out your arrival time please call [phone] between 1PM - 3PM on Monday Feb 14, 2021.  Remember: Instructions that are not followed completely may result in serious medical risk,  up to and including death, or upon the discretion of your surgeon and anesthesiologist your  surgery may need to be rescheduled.     _X__ 1. Do not eat food or drink fluids after midnight the night before your procedure.                 No chewing gum or hard candies.   __X__2.  On the morning of surgery brush your teeth with toothpaste and water, you                may rinse your mouth with mouthwash if you wish.  Do not swallow any toothpaste of mouthwash.     _X__ 3.  No Alcohol for 24 hours before or after surgery.   _X__ 4.  Do Not Smoke or use e-cigarettes For 24 Hours Prior to Your Surgery.                 Do not use any chewable tobacco products for at least 6 hours prior to                 Surgery.  _X__  5.  Do not use any recreational drugs (marijuana, cocaine, heroin, ecstasy, MDMA or other)                For at least one week prior to your surgery.  Combination of these drugs with anesthesia                May have life threatening results.  __X__ 6.  Notify your doctor if there is any change in your medical condition      (cold, fever, infections).     Do not wear jewelry, make-up, hairpins, clips or nail polish. Do not wear lotions, powders, or perfumes. You may wear deodorant. Do not shave 48 hours prior to surgery. Men may shave face and neck. Do not bring valuables to the hospital.    [facility] is not responsible for any belongings or valuables.  Contacts, dentures or bridgework may not be worn into surgery. Leave your suitcase in the car. After surgery it may be brought to your  room. For patients admitted to the hospital, discharge time is determined by your treatment team.   Patients discharged the day of surgery will not be allowed to drive home.   Make arrangements for someone to be with you for the first 24 hours of your Same Day Discharge.   __X__ Take these medicines the morning of surgery with A SIP OF WATER:    1. amLODipine (NORVASC) 10 MG   2. labetalol (NORMODYNE) 100   3.   4.  5.  6.  ____ Fleet Enema (as directed)   ____ Use CHG Soap (or wipes) as directed  ____ Use Benzoyl Peroxide Gel as instructed  ____ Use inhalers on the day of surgery  ____ Stop metformin 2 days prior to surgery    ____ Take 1/2 of usual insulin dose the night before surgery. No insulin the morning          of surgery.   ____ Call your PCP, cardiologist,  or Pulmonologist if taking Coumadin/Plavix/aspirin and ask when to stop before your surgery.   __X__ As instructed by your provider Ok to take Ibuprofen as needed for pain up to 1 day before your surgery.    __X__ Stop supplements until after surgery.    ____ Bring C-Pap to the hospital.    If you have any questions regarding your pre-procedure instructions,  Please call Pre-admit Testing at [phone].

## 2021-02-11 ENCOUNTER — Other Ambulatory Visit
Admission: RE | Admit: 2021-02-11 | Discharge: 2021-02-11 | Disposition: A | Discharge status: Home / Self Care | Payer: Medicare Other | Source: Ambulatory Visit | Attending: Urology | Admitting: Urology

## 2021-02-11 DIAGNOSIS — I10 Essential (primary) hypertension: Secondary | ICD-10-CM | POA: Diagnosis not present

## 2021-02-11 DIAGNOSIS — Z01810 Encounter for preprocedural cardiovascular examination: Secondary | ICD-10-CM | POA: Diagnosis not present

## 2021-02-11 DIAGNOSIS — Z01818 Encounter for other preprocedural examination: Secondary | ICD-10-CM | POA: Insufficient documentation

## 2021-02-11 LAB — CBC
HCT: 39.1 % (ref 39.0–52.0)
Hemoglobin: 13.3 g/dL (ref 13.0–17.0)
MCH: 30.4 pg (ref 26.0–34.0)
MCHC: 34 g/dL (ref 30.0–36.0)
MCV: 89.5 fL (ref 80.0–100.0)
Platelets: 213 10*3/uL (ref 150–400)
RBC: 4.37 MIL/uL (ref 4.22–5.81)
RDW: 13.6 % (ref 11.5–15.5)
WBC: 7.4 10*3/uL (ref 4.0–10.5)
nRBC: 0 % (ref 0.0–0.2)

## 2021-02-11 LAB — BASIC METABOLIC PANEL
Anion gap: 7 (ref 5–15)
BUN: 15 (ref 4–21)
BUN: 15 mg/dL (ref 8–23)
CO2: 28 mmol/L (ref 22–32)
Calcium: 8.9 mg/dL (ref 8.9–10.3)
Chloride: 109 mmol/L (ref 98–111)
Creatinine, Ser: 1.09 mg/dL (ref 0.61–1.24)
Creatinine: 1.1 (ref 0.6–1.3)
GFR, Estimated: >60 mL/min (ref >60)
Glucose, Bld: 82 mg/dL (ref 70–99)
Glucose: 82
Potassium: 3.3 mmol/L — ABNORMAL LOW (ref 3.5–5.1)
Potassium: 3.3 — AB (ref 3.4–5.3)
Sodium: 144 (ref 137–147)
Sodium: 144 mmol/L (ref 135–145)

## 2021-02-11 LAB — CBC AND DIFFERENTIAL: WBC: 7.4

## 2021-02-14 ENCOUNTER — Telehealth: Payer: Self-pay | Admitting: *Deleted

## 2021-02-14 NOTE — Telephone Encounter (Signed)
-----   Message from [name], MD sent at 02/13/2021 10:29 AM EDT ----- Serum potassium was slightly low.  Please have him increase potassium intake with orange juice/bananas

## 2021-02-14 NOTE — Telephone Encounter (Signed)
Notified patient as instructed, patient pleased °

## 2021-02-15 ENCOUNTER — Encounter
Admission: RE | Disposition: A | Discharge status: Home / Self Care | Payer: Self-pay | Source: Home / Self Care | Attending: Urology

## 2021-02-15 ENCOUNTER — Ambulatory Visit: Payer: Medicare Other

## 2021-02-15 ENCOUNTER — Other Ambulatory Visit: Payer: Self-pay

## 2021-02-15 ENCOUNTER — Encounter: Payer: Self-pay | Admitting: Urology

## 2021-02-15 ENCOUNTER — Ambulatory Visit: Payer: Medicare Other | Admitting: Urgent Care

## 2021-02-15 ENCOUNTER — Ambulatory Visit
Admission: RE | Admit: 2021-02-15 | Discharge: 2021-02-15 | Disposition: A | Discharge status: Home / Self Care | Payer: Medicare Other | Attending: Urology | Admitting: Urology

## 2021-02-15 DIAGNOSIS — Z885 Allergy status to narcotic agent status: Secondary | ICD-10-CM | POA: Insufficient documentation

## 2021-02-15 DIAGNOSIS — R350 Frequency of micturition: Secondary | ICD-10-CM | POA: Diagnosis not present

## 2021-02-15 DIAGNOSIS — N401 Enlarged prostate with lower urinary tract symptoms: Secondary | ICD-10-CM | POA: Insufficient documentation

## 2021-02-15 DIAGNOSIS — R3916 Straining to void: Secondary | ICD-10-CM | POA: Diagnosis not present

## 2021-02-15 DIAGNOSIS — N210 Calculus in bladder: Secondary | ICD-10-CM | POA: Diagnosis not present

## 2021-02-15 DIAGNOSIS — Z79899 Other long term (current) drug therapy: Secondary | ICD-10-CM | POA: Insufficient documentation

## 2021-02-15 DIAGNOSIS — Z87440 Personal history of urinary (tract) infections: Secondary | ICD-10-CM | POA: Diagnosis not present

## 2021-02-15 DIAGNOSIS — R351 Nocturia: Secondary | ICD-10-CM | POA: Insufficient documentation

## 2021-02-15 DIAGNOSIS — R300 Dysuria: Secondary | ICD-10-CM | POA: Diagnosis not present

## 2021-02-15 DIAGNOSIS — N529 Male erectile dysfunction, unspecified: Secondary | ICD-10-CM | POA: Diagnosis not present

## 2021-02-15 DIAGNOSIS — R3915 Urgency of urination: Secondary | ICD-10-CM | POA: Diagnosis not present

## 2021-02-15 DIAGNOSIS — Z91040 Latex allergy status: Secondary | ICD-10-CM | POA: Insufficient documentation

## 2021-02-15 HISTORY — PX: CYSTOSCOPY WITH LITHOLAPAXY: SHX1425

## 2021-02-15 SURGERY — CYSTOSCOPY, WITH BLADDER CALCULUS LITHOLAPAXY
Anesthesia: General | Site: Bladder

## 2021-02-15 MED ORDER — CEFAZOLIN SODIUM-DEXTROSE 2-4 GM/100ML-% IV SOLN: 2.0000 g | INTRAVENOUS | Status: DC

## 2021-02-15 MED ORDER — CHLORHEXIDINE GLUCONATE 0.12 % MT SOLN
OROMUCOSAL | Status: AC
  Administered 2021-02-15: 15 mL via OROMUCOSAL
  Filled 2021-02-15: qty 15

## 2021-02-15 MED ORDER — SUCCINYLCHOLINE CHLORIDE 20 MG/ML IJ SOLN
INTRAMUSCULAR | Status: DC | PRN
  Administered 2021-02-15: 100 mg via INTRAVENOUS

## 2021-02-15 MED ORDER — EPHEDRINE SULFATE 50 MG/ML IJ SOLN
INTRAMUSCULAR | Status: DC | PRN
  Administered 2021-02-15 (×2): 10 mg via INTRAVENOUS

## 2021-02-15 MED ORDER — PROPOFOL 10 MG/ML IV BOLUS
INTRAVENOUS | Status: DC | PRN
  Administered 2021-02-15: 200 mg via INTRAVENOUS

## 2021-02-15 MED ORDER — ONDANSETRON HCL 4 MG/2ML IJ SOLN
INTRAMUSCULAR | Status: DC | PRN
  Administered 2021-02-15: 4 mg via INTRAVENOUS

## 2021-02-15 MED ORDER — SEVOFLURANE IN SOLN
RESPIRATORY_TRACT | Status: AC
  Filled 2021-02-15: qty 250

## 2021-02-15 MED ORDER — PROPOFOL 500 MG/50ML IV EMUL
INTRAVENOUS | Status: DC | PRN
  Administered 2021-02-15: 75 ug/kg/min via INTRAVENOUS
  Administered 2021-02-15: 145 ug/kg/min via INTRAVENOUS

## 2021-02-15 MED ORDER — ACETAMINOPHEN 10 MG/ML IV SOLN
INTRAVENOUS | Status: AC
  Filled 2021-02-15: qty 100

## 2021-02-15 MED ORDER — ONDANSETRON HCL 4 MG/2ML IJ SOLN: 4.0000 mg | Freq: Once | INTRAMUSCULAR | Status: DC | PRN

## 2021-02-15 MED ORDER — SUGAMMADEX SODIUM 500 MG/5ML IV SOLN
INTRAVENOUS | Status: DC | PRN
  Administered 2021-02-15: 500 mg via INTRAVENOUS

## 2021-02-15 MED ORDER — CHLORHEXIDINE GLUCONATE 0.12 % MT SOLN: 15.0000 mL | Freq: Once | OROMUCOSAL | Status: AC

## 2021-02-15 MED ORDER — LIDOCAINE HCL (CARDIAC) PF 100 MG/5ML IV SOSY
PREFILLED_SYRINGE | INTRAVENOUS | Status: DC | PRN
  Administered 2021-02-15: 100 mg via INTRAVENOUS

## 2021-02-15 MED ORDER — CEFAZOLIN SODIUM-DEXTROSE 2-4 GM/100ML-% IV SOLN
INTRAVENOUS | Status: AC
  Filled 2021-02-15: qty 100

## 2021-02-15 MED ORDER — ROCURONIUM BROMIDE 100 MG/10ML IV SOLN
INTRAVENOUS | Status: DC | PRN
  Administered 2021-02-15: 10 mg via INTRAVENOUS
  Administered 2021-02-15: 20 mg via INTRAVENOUS
  Administered 2021-02-15: 40 mg via INTRAVENOUS

## 2021-02-15 MED ORDER — LACTATED RINGERS IV SOLN: INTRAVENOUS | Status: DC

## 2021-02-15 MED ORDER — FENTANYL CITRATE (PF) 100 MCG/2ML IJ SOLN
INTRAMUSCULAR | Status: DC | PRN
  Administered 2021-02-15: 50 ug via INTRAVENOUS

## 2021-02-15 MED ORDER — DEXAMETHASONE SODIUM PHOSPHATE 10 MG/ML IJ SOLN
INTRAMUSCULAR | Status: DC | PRN
  Administered 2021-02-15: 10 mg via INTRAVENOUS

## 2021-02-15 MED ORDER — SODIUM CHLORIDE FLUSH 0.9 % IV SOLN
INTRAVENOUS | Status: AC
  Filled 2021-02-15: qty 10

## 2021-02-15 MED ORDER — GLYCOPYRROLATE 0.2 MG/ML IJ SOLN
INTRAMUSCULAR | Status: DC | PRN
  Administered 2021-02-15: .2 mg via INTRAVENOUS

## 2021-02-15 MED ORDER — FAMOTIDINE 20 MG PO TABS: 20.0000 mg | ORAL_TABLET | Freq: Once | ORAL | Status: AC

## 2021-02-15 MED ORDER — PROPOFOL 10 MG/ML IV BOLUS
INTRAVENOUS | Status: AC
  Filled 2021-02-15: qty 60

## 2021-02-15 MED ORDER — FENTANYL CITRATE (PF) 100 MCG/2ML IJ SOLN: 25.0000 ug | INTRAMUSCULAR | Status: DC | PRN

## 2021-02-15 MED ORDER — FAMOTIDINE 20 MG PO TABS
ORAL_TABLET | ORAL | Status: AC
  Administered 2021-02-15: 20 mg via ORAL
  Filled 2021-02-15: qty 1

## 2021-02-15 MED ORDER — FENTANYL CITRATE (PF) 100 MCG/2ML IJ SOLN
INTRAMUSCULAR | Status: AC
  Filled 2021-02-15: qty 2

## 2021-02-15 MED ORDER — ORAL CARE MOUTH RINSE: 15.0000 mL | Freq: Once | OROMUCOSAL | Status: AC

## 2021-02-15 MED ORDER — ACETAMINOPHEN 10 MG/ML IV SOLN
INTRAVENOUS | Status: DC | PRN
  Administered 2021-02-15: 1000 mg via INTRAVENOUS

## 2021-02-15 MED ORDER — PHENAZOPYRIDINE HCL 200 MG PO TABS: 200.0000 mg | ORAL_TABLET | Freq: Three times a day (TID) | ORAL | 0 refills | Status: DC | PRN

## 2021-02-15 SURGICAL SUPPLY — 21 items
BAG DRAIN CYSTO-URO LG1000N (MISCELLANEOUS) ×2 IMPLANT
BAG URINE DRAIN 2000ML AR STRL (UROLOGICAL SUPPLIES) ×2 IMPLANT
BASKET ZERO TIP 1.9FR (BASKET) IMPLANT
CATH FOL 2WAY LX 18X30 (CATHETERS) ×2 IMPLANT
FIBER LASER FLEXIVA PULSE 910 (Laser) ×2 IMPLANT
GLOVE SURG UNDER POLY LF SZ7.5 (GLOVE) ×2 IMPLANT
GOWN STRL REUS W/ TWL LRG LVL3 (GOWN DISPOSABLE) ×2 IMPLANT
GOWN STRL REUS W/ TWL XL LVL3 (GOWN DISPOSABLE) ×1 IMPLANT
GOWN STRL REUS W/TWL LRG LVL3 (GOWN DISPOSABLE) ×2
GOWN STRL REUS W/TWL XL LVL3 (GOWN DISPOSABLE) ×1
IV NS IRRIG 3000ML ARTHROMATIC (IV SOLUTION) ×10 IMPLANT
KIT PROBE TRILOGY 3.9X350 (MISCELLANEOUS) IMPLANT
KIT TURNOVER CYSTO (KITS) ×2 IMPLANT
MANIFOLD NEPTUNE II (INSTRUMENTS) ×2 IMPLANT
PACK CYSTO AR (MISCELLANEOUS) ×2 IMPLANT
SET IRRIG Y TYPE TUR BLADDER L (SET/KITS/TRAYS/PACK) ×2 IMPLANT
SURGILUBE 2OZ TUBE FLIPTOP (MISCELLANEOUS) ×2 IMPLANT
SYR TOOMEY IRRIG 70ML (MISCELLANEOUS) ×2
SYRINGE TOOMEY IRRIG 70ML (MISCELLANEOUS) ×1 IMPLANT
WATER STERILE IRR 1000ML POUR (IV SOLUTION) ×2 IMPLANT
WATER STERILE IRR 3000ML UROMA (IV SOLUTION) IMPLANT

## 2021-02-15 NOTE — Anesthesia Procedure Notes (Addendum)
Procedure Name: Intubation °Performed by: Fletcher-Harrison, Glenys Snader, CRNA °Pre-anesthesia Checklist: Patient identified, Emergency Drugs available, Suction available and Patient being monitored °Patient Re-evaluated:Patient Re-evaluated prior to induction °Oxygen Delivery Method: Circle system utilized °Preoxygenation: Pre-oxygenation with 100% oxygen °Induction Type: IV induction °Ventilation: Mask ventilation without difficulty °Tube type: Oral °Number of attempts: 1 °Airway Equipment and Method: Stylet and Oral airway °Placement Confirmation: ETT inserted through vocal cords under direct vision,  positive ETCO2,  breath sounds checked- equal and bilateral and CO2 detector °Secured at: 21 cm °Tube secured with: Tape °Dental Injury: Teeth and Oropharynx as per pre-operative assessment  ° ° ° ° ° ° °

## 2021-02-15 NOTE — Anesthesia Preprocedure Evaluation (Addendum)
Anesthesia Evaluation  Patient identified by MRN, date of birth, ID band Patient awake    Reviewed: Allergy & Precautions, H&P , NPO status , Patient's Chart, lab work & pertinent test results, reviewed documented beta blocker date and time   History of Anesthesia Complications Negative for: history of anesthetic complications  Airway Mallampati: III  TM Distance: >3 FB Neck ROM: full    Dental no notable dental hx. (+) Dental Advidsory Given, Partial Upper, Partial Lower, Teeth Intact   Pulmonary neg shortness of breath, sleep apnea and Continuous Positive Airway Pressure Ventilation , neg COPD, neg recent URI, former smoker,    Pulmonary exam normal breath sounds clear to auscultation       Cardiovascular Exercise Tolerance: Good hypertension, (-) angina(-) Past MI and (-) Cardiac Stents Normal cardiovascular exam(-) dysrhythmias + Valvular Problems/Murmurs  Rhythm:regular Rate:Normal     Neuro/Psych PSYCHIATRIC DISORDERS Anxiety Depression negative neurological ROS     GI/Hepatic Neg liver ROS, GERD  ,  Endo/Other  negative endocrine ROS  Renal/GU negative Renal ROS  negative genitourinary   Musculoskeletal   Abdominal   Peds  Hematology negative hematology ROS (+)   Anesthesia Other Findings Past Medical History: No date: Actinic keratosis No date: Allergic rhinitis No date: Anxiety No date: Arthritis No date: Bilirubinuria No date: BPH (benign prostatic hyperplasia) No date: Degeneration, intervertebral disc, lumbosacral No date: Erectile dysfunction No date: Eustachian tube dysfunction No date: Fibromyalgia No date: GERD (gastroesophageal reflux disease)     Comment:  OTC if needed Pepcid No date: Heart murmur No date: Hypertension No date: Osteoarthrosis No date: Sinusitis No date: Sleep apnea     Comment:  uses CPAP, will bring mask No date: Viral warts   Reproductive/Obstetrics negative OB  ROS                             Anesthesia Physical Anesthesia Plan  ASA: II  Anesthesia Plan: General   Post-op Pain Management:    Induction: Intravenous  PONV Risk Score and Plan: 2 and Ondansetron, Dexamethasone, Treatment may vary due to age or medical condition, TIVA and Propofol infusion  Airway Management Planned: LMA and Oral ETT  Additional Equipment:   Intra-op Plan:   Post-operative Plan: Extubation in OR  Informed Consent: I have reviewed the patients History and Physical, chart, labs and discussed the procedure including the risks, benefits and alternatives for the proposed anesthesia with the patient or authorized representative who has indicated his/her understanding and acceptance.     Dental Advisory Given  Plan Discussed with: Anesthesiologist, CRNA and Surgeon  Anesthesia Plan Comments:        Anesthesia Quick Evaluation

## 2021-02-15 NOTE — Transfer of Care (Signed)
Immediate Anesthesia Transfer of Care Note  Patient: [facility] Joe Patton.  Procedure(s) Performed: CYSTOSCOPY WITH LITHOLAPAXY (N/A Bladder)  Patient Location: PACU  Anesthesia Type:General  Level of Consciousness: awake, drowsy and patient cooperative  Airway & Oxygen Therapy: Patient Spontanous Breathing and Patient connected to face mask oxygen  Post-op Assessment: Report given to RN and Post -op Vital signs reviewed and stable  Post vital signs: Reviewed and stable  Last Vitals:  Vitals Value Taken Time  BP 153/85 02/15/21 1646  Temp    Pulse 76 02/15/21 1650  Resp 15 02/15/21 1650  SpO2 95 % 02/15/21 1650  Vitals shown include unvalidated device data.  Last Pain:  Vitals:   02/15/21 1317  TempSrc: Temporal  PainSc: 0-No pain         Complications: No complications documented.

## 2021-02-15 NOTE — Op Note (Signed)
Preoperative diagnosis:  Bladder calculus (>2.5 cm)  Postoperative diagnosis:  Same  Procedure: Cystolitholapaxy (>2.5 cm)  Surgeon: [name], MD  Anesthesia: General  Complications: None  Intraoperative findings:  Cystoscopy-urethra normal in caliber without stricture; prostate nonocclusive; calculus extending past the bladder neck and adherent to left proximal prostate.  Once this portion of the calculus was treated the calculus was triangular-shaped with the largest portion of the stone measuring >3 cm  EBL: Minimal  Specimens: None  Indication: Joe Patton. is a 75 y.o. male with a history of recurrent UTI and irritative voiding symptoms.  KUB is obtained which demonstrated a large calcification in the true bony pelvis measuring >3 cm consistent with a bladder calculus.  After reviewing the management options for treatment, he elected to proceed with the above surgical procedure(s). We have discussed the potential benefits and risks of the procedure, side effects of the proposed treatment, the likelihood of the patient achieving the goals of the procedure, and any potential problems that might occur during the procedure or recuperation. Informed consent has been obtained.  Description of procedure:  The patient was taken to the operating room and general anesthesia was induced.  The patient was placed in the dorsal lithotomy position, prepped and draped in the usual sterile fashion, and preoperative antibiotics were administered. A preoperative time-out was performed.   A 21 French cystoscope was lubricated, passed per urethra and advanced proximally with findings as described above.  The cystoscope was then removed and a 24 French continuous-flow resectoscope sheath with visual obturator was lubricated and passed per urethra.  The visual obturator was removed and replaced with a laser bridge.  A 1000 m holmium laser fiber was placed through the laser bridge and the  portion of the calculus extending into the prostatic urethra was treated at settings of 0.2 J / 40 Hz.  Once this portion of the calculus was treated the calculus flipped back into the bladder.  The calculus was then dusted with a final setting of 0.5 J / 50 Hz reached.  Once the remaining size was small power was decreased to 0.2/40.  Once adequately treated all fragments were removed via irrigation with a Toomey syringe and fill/drain.  At the completion procedure hemostasis was adequate.  A 16 French Foley cath was placed with return of clear effluent upon irrigation.  After anesthetic reversal he was transported to the PACU in stable condition.     Plan: If urine remains clear to pink-tinged Foley catheter will be removed prior to discharge Follow-up office visit ~ 1 month    [name], M.D.

## 2021-02-15 NOTE — Interval H&P Note (Signed)
History and Physical Interval Note:  02/15/2021 3:15 PM  [place].  has presented today for surgery, with the diagnosis of Bladder Calculus.  The various methods of treatment have been discussed with the patient and family. After consideration of risks, benefits and other options for treatment, the patient has consented to  Procedure(s): CYSTOSCOPY WITH LITHOLAPAXY (N/A) as a surgical intervention.  The patient's history has been reviewed, patient examined, no change in status, stable for surgery.  I have reviewed the patient's chart and labs.  Questions were answered to the patient's satisfaction.     [place]

## 2021-02-15 NOTE — Discharge Instructions (Signed)
Bladder stone removal   General instructions:     Your recent bladder surgery requires very little post hospital care but some definite precautions.  The bladder will be irritated and common postoperative symptoms include urinary frequency, urgency and burning with urination A prescription for phenazopyridine which will help with the burning was sent to your pharmacy.  You may resume your regular medications.   Diet:  You may return to your normal diet immediately. Because of the raw surface of your bladder, alcohol, spicy foods, foods high in acid and drinks with caffeine may cause irritation or frequency and should be used in moderation. To keep your urine flowing freely and avoid constipation, drink plenty of fluids during the day (8-10 glasses). Tip: Avoid cranberry juice because it is very acidic.  Activity:  Your physical activity doesn't need to be restricted. However, if you are very active, you may see some blood in the urine. We suggest that you reduce your activity under the circumstances until the bleeding has stopped.  Bowels:  It is important to keep your bowels regular during the postoperative period. Straining with bowel movements can cause bleeding. A bowel movement every other day is reasonable. Use a mild laxative if needed, such as milk of magnesia 2-3 tablespoons, or 2 Dulcolax tablets. Call if you continue to have problems. If you had been taking narcotics for pain, before, during or after your surgery, you may be constipated. Take a laxative if necessary.    Medication:  You should resume your pre-surgery medications unless told not to. In addition you may be given an antibiotic to prevent or treat infection. Antibiotics are not always necessary. All medication should be taken as prescribed until the bottles are finished unless you are having an unusual reaction to one of the drugs.   [facility] [address], [place],  [place] [address] [phone]   AMBULATORY SURGERY  DISCHARGE INSTRUCTIONS   1) The drugs that you were given will stay in your system until tomorrow so for the next 24 hours you should not:  A) Drive an automobile B) Make any legal decisions C) Drink any alcoholic beverage   2) You may resume regular meals tomorrow.  Today it is better to start with liquids and gradually work up to solid foods.  You may eat anything you prefer, but it is better to start with liquids, then soup and crackers, and gradually work up to solid foods.   3) Please notify your doctor immediately if you have any unusual bleeding, trouble breathing, redness and pain at the surgery site, drainage, fever, or pain not relieved by medication.    4) Additional Instructions:        Please contact your physician with any problems or Same Day Surgery at [phone], Monday through Friday 6 am to 4 pm, or [place] at [facility] number at [phone].

## 2021-02-15 NOTE — Interval H&P Note (Signed)
History and Physical Interval Note: CV: RRR Lungs clear  02/15/2021 3:19 PM  [place].  has presented today for surgery, with the diagnosis of Bladder Calculus.  The various methods of treatment have been discussed with the patient and family. After consideration of risks, benefits and other options for treatment, the patient has consented to  Procedure(s): CYSTOSCOPY WITH LITHOLAPAXY (N/A) as a surgical intervention.  The patient's history has been reviewed, patient examined, no change in status, stable for surgery.  I have reviewed the patient's chart and labs.  Questions were answered to the patient's satisfaction.     [place]

## 2021-02-16 ENCOUNTER — Encounter: Payer: Self-pay | Admitting: Urology

## 2021-02-17 ENCOUNTER — Telehealth: Payer: Self-pay

## 2021-02-17 NOTE — Telephone Encounter (Signed)
Pt called asking when he is able to mow his lawn. Patient states it is a sit down lawnmower. Advised pt if he is not having any hematuria he should be okay to do so by the weekend as per PA Sam

## 2021-02-18 NOTE — Anesthesia Postprocedure Evaluation (Signed)
Anesthesia Post Note  Patient: [facility] Bohdan Macho.  Procedure(s) Performed: CYSTOSCOPY WITH LITHOLAPAXY (N/A Bladder)  Patient location during evaluation: PACU Anesthesia Type: General Level of consciousness: awake and alert and oriented Pain management: pain level controlled Vital Signs Assessment: post-procedure vital signs reviewed and stable Respiratory status: spontaneous breathing Cardiovascular status: blood pressure returned to baseline Anesthetic complications: no   No complications documented.   Last Vitals:  Vitals:   02/15/21 1730 02/15/21 1759  BP: (!) 151/81 (!) 177/86  Pulse: 69 66  Resp: 15 18  Temp: 37 C 36.6 C  SpO2: 100% 94%    Last Pain:  Vitals:   02/15/21 1759  TempSrc: Temporal  PainSc: 0-No pain                 Adalid Beckmann

## 2021-03-08 ENCOUNTER — Other Ambulatory Visit: Payer: Self-pay | Admitting: Urology

## 2021-03-14 ENCOUNTER — Ambulatory Visit (INDEPENDENT_AMBULATORY_CARE_PROVIDER_SITE_OTHER): Payer: Medicare Other | Admitting: Urology

## 2021-03-14 ENCOUNTER — Other Ambulatory Visit: Payer: Self-pay

## 2021-03-14 ENCOUNTER — Encounter: Payer: Self-pay | Admitting: Urology

## 2021-03-14 VITALS — BP 171/80 | HR 78 | Ht 72.0 in | Wt 240.0 lb

## 2021-03-14 DIAGNOSIS — N210 Calculus in bladder: Secondary | ICD-10-CM

## 2021-03-14 NOTE — Progress Notes (Signed)
03/14/2021 6:31 PM   Joe Patton [address] [place]. 12-23-1945 [id number]  Referring provider: Juline Patch, MD [address] [place],  [place] [address]  Chief Complaint  Patient presents with   Benign Prostatic Hypertrophy    HPI: 75 y.o. male presents for postop follow-up.  Status post cystolitholapaxy of a 3 cm bladder calculus 02/15/2021 Significant obstructive and irritative symptoms preoperatively He had no postoperative problems and states he is significantly better Has some urinary urgency and postvoid dribbling He is on tamsulosin Denies dysuria, gross hematuria No flank, abdominal or pelvic pain   PMH: Past Medical History:  Diagnosis Date   Actinic keratosis    Allergic rhinitis    Anxiety    Arthritis    Bilirubinuria    BPH (benign prostatic hyperplasia)    Degeneration, intervertebral disc, lumbosacral    Erectile dysfunction    Eustachian tube dysfunction    Fibromyalgia    GERD (gastroesophageal reflux disease)    OTC if needed Pepcid   Heart murmur    Hypertension    Osteoarthrosis    Sinusitis    Sleep apnea    uses CPAP, will bring mask   Viral warts     Surgical History: Past Surgical History:  Procedure Laterality Date   CATARACT EXTRACTION     right   CHOLECYSTECTOMY  2005   COLONOSCOPY  2014   cleared for 10 yrs- Dr Allen Norris   CYSTOSCOPY WITH LITHOLAPAXY N/A 02/15/2021   Procedure: CYSTOSCOPY WITH LITHOLAPAXY;  Surgeon: [name], MD;  Location: [facility];  Service: Urology;  Laterality: N/A;   EYE SURGERY Right    GALLBLADDER SURGERY     HIP SURGERY     left   JOINT REPLACEMENT     right and left total hips   TONSILLECTOMY     TOTAL SHOULDER ARTHROPLASTY Left 09/03/2014   Procedure: TOTAL SHOULDER ARTHROPLASTY;  Surgeon: Nita Sells, MD;  Location: [facility];  Service: Orthopedics;  Laterality: Left;    Home Medications:  Allergies as of 03/14/2021       Reactions   Tramadol    Anger issues   Latex Rash         Medication List        Accurate as of March 14, 2021  6:31 PM. If you have any questions, ask your nurse or doctor.          STOP taking these medications    Gemtesa 75 MG Tabs Generic drug: Vibegron Stopped by: [name], MD       TAKE these medications    amLODipine 10 MG tablet Commonly known as: NORVASC Take 10 mg by mouth daily.   cholecalciferol 25 MCG (1000 UNIT) tablet Commonly known as: VITAMIN D3 Take 1,000 Units by mouth daily.   citalopram 20 MG tablet Commonly known as: CELEXA Take 1 tablet (20 mg total) by mouth daily.   labetalol 100 MG tablet Commonly known as: NORMODYNE Take 100 mg by mouth daily.   losartan 100 MG tablet Commonly known as: COZAAR Take 100 mg by mouth daily.   Multi-Vitamins Tabs Take 1 tablet by mouth daily.   phenazopyridine 200 MG tablet Commonly known as: PYRIDIUM Take 1 tablet (200 mg total) by mouth 3 (three) times daily as needed (burning with urination).   sildenafil 20 MG tablet Commonly known as: REVATIO TAKE 2-5 TABLETS 1 HOUR PRIOR TO INTERCOURSE   sodium chloride 0.65 % Soln nasal spray Commonly known as:  OCEAN Place 1 spray into both nostrils as needed for congestion.   tamsulosin 0.4 MG Caps capsule Commonly known as: FLOMAX Take 0.4 mg by mouth daily.   vitamin E 1000 UNIT capsule Take 1,000 Units by mouth daily.        Allergies:  Allergies  Allergen Reactions   Tramadol     Anger issues   Latex Rash    Family History: Family History  Problem Relation Age of Onset   Breast cancer Mother    Hypertension Father    Heart attack Father    Stroke Father     Social History:  reports that he quit smoking about 13 years ago. His smoking use included cigarettes. He has a 30.00 pack-year smoking history. He has never used smokeless tobacco. He reports that he does not drink alcohol and does not use drugs.   Physical Exam: BP (!) 171/80   Pulse 78   Ht 6' (1.829 m)   Wt  240 lb (108.9 kg)   BMI 32.55 kg/m   Constitutional:  Alert and oriented, No acute distress. HEENT: [place] AT, moist mucus membranes.  Trachea midline, no masses. Cardiovascular: No clubbing, cyanosis, or edema. Respiratory: Normal respiratory effort, no increased work of breathing. GI: Abdomen is soft, nontender, nondistended, no abdominal masses   Assessment & Plan:    1. Bladder calculus Doing well status post cystolitholapaxy Mild lower urinary tract symptoms which may improve further out from his surgery Follow-up 6 months with [place], MD  [facility] [address] [place], [place] [address] [phone]

## 2021-04-18 ENCOUNTER — Telehealth: Payer: Self-pay

## 2021-04-18 NOTE — Telephone Encounter (Unsigned)
Copied from [place] [id number]. Topic: General - Other >> Apr 18, 2021  3:26 PM Celene Kras wrote: Reason for CRM: Pt called stating that he received a call from someone from [facility] called stating that his information had been shared with them by Dr. Ronnald Ramp and that they were checking to see if he had received his vaccines and swab that shows if he would be in the percent that may lose 70% organ function. Pt believes that this may have been a spam call from [phone]. Please advise.

## 2021-04-26 ENCOUNTER — Encounter: Payer: Self-pay | Admitting: Family Medicine

## 2021-04-26 ENCOUNTER — Ambulatory Visit (INDEPENDENT_AMBULATORY_CARE_PROVIDER_SITE_OTHER): Payer: Medicare Other | Admitting: Family Medicine

## 2021-04-26 ENCOUNTER — Other Ambulatory Visit: Payer: Self-pay

## 2021-04-26 VITALS — BP 130/88 | HR 72 | Ht 72.0 in | Wt 243.0 lb

## 2021-04-26 DIAGNOSIS — Z6830 Body mass index (BMI) 30.0-30.9, adult: Secondary | ICD-10-CM

## 2021-04-26 DIAGNOSIS — F3342 Major depressive disorder, recurrent, in full remission: Secondary | ICD-10-CM

## 2021-04-26 DIAGNOSIS — F419 Anxiety disorder, unspecified: Secondary | ICD-10-CM | POA: Diagnosis not present

## 2021-04-26 MED ORDER — CITALOPRAM HYDROBROMIDE 20 MG PO TABS: 20.0000 mg | ORAL_TABLET | Freq: Every day | ORAL | 1 refills | Status: DC

## 2021-04-26 NOTE — Progress Notes (Signed)
Date:  04/26/2021   Name:  Joe Patton.   DOB:  [birth year]   MRN:  [id number]   Chief Complaint: Depression  Depression        This is a chronic problem.  The current episode started more than 1 year ago.   The onset quality is gradual.   The problem occurs rarely.  The problem has been gradually improving since onset.  Associated symptoms include no decreased concentration, no fatigue, no helplessness, no hopelessness, does not have insomnia, not irritable, no restlessness, no decreased interest, no appetite change, no body aches, no myalgias, no headaches, no indigestion, not sad and no suicidal ideas.  Past treatments include SSRIs - Selective serotonin reuptake inhibitors.  Compliance with treatment is good.  Previous treatment provided moderate relief.  Lab Results  Component Value Date   CREATININE 1.09 02/11/2021   BUN 15 02/11/2021   NA 144 02/11/2021   K 3.3 (L) 02/11/2021   CL 109 02/11/2021   CO2 28 02/11/2021   No results found for: CHOL, HDL, LDLCALC, LDLDIRECT, TRIG, CHOLHDL No results found for: TSH No results found for: HGBA1C Lab Results  Component Value Date   WBC 7.4 02/11/2021   HGB 13.3 02/11/2021   HCT 39.1 02/11/2021   MCV 89.5 02/11/2021   PLT 213 02/11/2021   Lab Results  Component Value Date   ALT 21 08/25/2014   AST 21 08/25/2014   ALKPHOS 84 08/25/2014   BILITOT 0.5 08/25/2014     Review of Systems  Constitutional:  Negative for appetite change, chills, fatigue and fever.  HENT:  Negative for drooling, ear discharge, ear pain and sore throat.   Respiratory:  Negative for cough, shortness of breath and wheezing.   Cardiovascular:  Negative for chest pain, palpitations and leg swelling.  Gastrointestinal:  Negative for abdominal pain, blood in stool, constipation, diarrhea and nausea.  Endocrine: Negative for polydipsia.  Genitourinary:  Negative for dysuria, frequency, hematuria and urgency.  Musculoskeletal:  Positive for  arthralgias. Negative for back pain, myalgias and neck pain.  Skin:  Negative for rash.  Allergic/Immunologic: Negative for environmental allergies.  Neurological:  Negative for dizziness and headaches.  Hematological:  Does not bruise/bleed easily.  Psychiatric/Behavioral:  Positive for depression. Negative for decreased concentration and suicidal ideas. The patient is not nervous/anxious and does not have insomnia.    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   Basal cell papilloma 12/11/2018   Bilirubin in urine 12/11/2018   Chronic venous insufficiency 12/11/2018   Coxitis 12/11/2018   DDD (degenerative disc disease), lumbosacral 12/11/2018   Dermatitis, stasis 12/11/2018   Dysfunction of eustachian tube 12/11/2018   Flu vaccine need 12/11/2018   Heart murmur 12/11/2018   Hypertension 12/11/2018   Impingement syndrome of shoulder 12/11/2018   Insect bite, nonvenomous 12/11/2018   Need for vaccination 12/11/2018   Preop examination 12/11/2018   Sinus infection 12/11/2018   Recurrent major depressive disorder, in full remission ([place]) 05/21/2018   Benign prostatic hyperplasia with lower urinary tract symptoms 12/05/2017   Erectile dysfunction 12/05/2017   Condyloma acuminatum 09/04/2017   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   Presence of artificial shoulder joint 09/03/2014   Osteoarthritis of both knees 07/07/2014   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   OSA (obstructive sleep apnea) 05/08/2014   Abnormal EKG 05/06/2014   Depression 05/06/2014  Obesity 05/06/2014   Urinary urgency 07/22/2012    Allergies  Allergen Reactions   Tramadol     Anger issues   Latex Rash    Past Surgical History:  Procedure Laterality Date   CATARACT EXTRACTION     right   CHOLECYSTECTOMY  2005   COLONOSCOPY  2014   cleared for 10 yrs- Dr Allen Norris   CYSTOSCOPY  WITH LITHOLAPAXY N/A 02/15/2021   Procedure: CYSTOSCOPY WITH LITHOLAPAXY;  Surgeon: [name], MD;  Location: [facility];  Service: Urology;  Laterality: N/A;   EYE SURGERY Right    GALLBLADDER SURGERY     HIP SURGERY     left   JOINT REPLACEMENT     right and left total hips   TONSILLECTOMY     TOTAL SHOULDER ARTHROPLASTY Left 09/03/2014   Procedure: TOTAL SHOULDER ARTHROPLASTY;  Surgeon: Nita Sells, MD;  Location: [facility];  Service: Orthopedics;  Laterality: Left;    Social History   Tobacco Use   Smoking status: Former    Packs/day: 1.50    Years: 20.00    Pack years: 30.00    Types: Cigarettes    Quit date: 10/03/2007    Years since quitting: 13.5   Smokeless tobacco: Never   Tobacco comments:    Has not resumed smoking. No need to provide pt with smoking cessation materials  Vaping Use   Vaping Use: Never used  Substance Use Topics   Alcohol use: No   Drug use: No     Medication list has been reviewed and updated.  Current Meds  Medication Sig   amLODipine (NORVASC) 10 MG tablet Take 10 mg by mouth daily.   cholecalciferol (VITAMIN D3) 25 MCG (1000 UNIT) tablet Take 1,000 Units by mouth daily.   citalopram (CELEXA) 20 MG tablet Take 1 tablet (20 mg total) by mouth daily.   labetalol (NORMODYNE) 100 MG tablet Take 100 mg by mouth daily.   losartan (COZAAR) 100 MG tablet Take 100 mg by mouth daily.   Multiple Vitamin (MULTI-VITAMINS) TABS Take 1 tablet by mouth daily.   sildenafil (REVATIO) 20 MG tablet TAKE 2-5 TABLETS 1 HOUR PRIOR TO INTERCOURSE   sodium chloride (OCEAN) 0.65 % SOLN nasal spray Place 1 spray into both nostrils as needed for congestion.   vitamin E 1000 UNIT capsule Take 1,000 Units by mouth daily.   [DISCONTINUED] phenazopyridine (PYRIDIUM) 200 MG tablet Take 1 tablet (200 mg total) by mouth 3 (three) times daily as needed (burning with urination).    PHQ 2/9 Scores 04/26/2021 11/12/2020 08/04/2020 05/06/2020  PHQ - 2 Score 0 0 0 0   PHQ- 9 Score 0 0 - 0    GAD 7 : Generalized Anxiety Score 04/26/2021 11/12/2020 05/06/2020 06/24/2019  Nervous, Anxious, on Edge 0 0 0 0  Control/stop worrying 0 0 0 0  Worry too much - different things 0 0 0 0  Trouble relaxing 0 0 0 0  Restless 0 0 0 0  Easily annoyed or irritable 0 0 0 0  Afraid - awful might happen 0 0 0 0  Total GAD 7 Score 0 0 0 0  Anxiety Difficulty - - Not difficult at all -    BP Readings from Last 3 Encounters:  04/26/21 130/88  03/14/21 (!) 171/80  02/15/21 (!) 177/86    Physical Exam Vitals and nursing note reviewed.  Constitutional:      General: He is not irritable. HENT:     Head: Normocephalic.  Right Ear: Tympanic membrane, ear canal and external ear normal.     Left Ear: Tympanic membrane, ear canal and external ear normal.     Nose: Nose normal.     Mouth/Throat:     Mouth: Mucous membranes are moist.  Eyes:     General: No scleral icterus.       Right eye: No discharge.        Left eye: No discharge.     Conjunctiva/sclera: Conjunctivae normal.     Pupils: Pupils are equal, round, and reactive to light.  Neck:     Thyroid: No thyromegaly.     Vascular: No JVD.     Trachea: No tracheal deviation.  Cardiovascular:     Rate and Rhythm: Normal rate and regular rhythm.     Heart sounds: Normal heart sounds. No murmur heard.   No friction rub. No gallop.  Pulmonary:     Effort: No respiratory distress.     Breath sounds: Normal breath sounds. No wheezing, rhonchi or rales.  Abdominal:     General: Bowel sounds are normal.     Palpations: Abdomen is soft. There is no mass.     Tenderness: There is no abdominal tenderness. There is no guarding or rebound.  Musculoskeletal:        General: No tenderness. Normal range of motion.     Cervical back: Normal range of motion and neck supple.  Lymphadenopathy:     Cervical: No cervical adenopathy.  Skin:    General: Skin is warm.     Findings: No bruising or rash.  Neurological:      Mental Status: He is alert and oriented to person, place, and time.     Cranial Nerves: No cranial nerve deficit.     Deep Tendon Reflexes: Reflexes are normal and symmetric.    Wt Readings from Last 3 Encounters:  04/26/21 243 lb (110.2 kg)  03/14/21 240 lb (108.9 kg)  02/10/21 244 lb 14.9 oz (111.1 kg)    BP 130/88   Pulse 72   Ht 6' (1.829 m)   Wt 243 lb (110.2 kg)   BMI 32.96 kg/m   Assessment and Plan:  1. Recurrent major depressive disorder, in full remission ([place]) Chronic.  Controlled.  Stable.  PHQ is 0. we will continue citalopram 20 mg once a day. - citalopram (CELEXA) 20 MG tablet; Take 1 tablet (20 mg total) by mouth daily.  Dispense: 90 tablet; Refill: 1  2. Anxiety Chronic.  Controlled.  Stable.  Continue citalopram 20 mg once a day. - citalopram (CELEXA) 20 MG tablet; Take 1 tablet (20 mg total) by mouth daily.  Dispense: 90 tablet; Refill: 1  3. BMI 30.0-30.9,adult BMI remains above 30.  We will check lipid panel for current level of lipid control. - Lipid Panel With LDL/HDL Ratio

## 2021-04-27 ENCOUNTER — Telehealth: Payer: Self-pay

## 2021-04-27 NOTE — Telephone Encounter (Signed)
Copied from [place] [id number]. Topic: General - Other >> Apr 27, 2021  8:44 AM Holley Dexter N wrote: Reason for CRM: Pt called in wanting Baxter Flattery to give him a call back, she was wanting to know when his vaccine date was and it was on 08/16/2020. But he also wanted her to reach out to him about something else. Please advise.

## 2021-04-27 NOTE — Telephone Encounter (Signed)
Pt requested a call back regarding the below message.

## 2021-05-02 DIAGNOSIS — Z23 Encounter for immunization: Secondary | ICD-10-CM | POA: Diagnosis not present

## 2021-05-06 ENCOUNTER — Telehealth: Payer: Self-pay

## 2021-05-06 NOTE — Telephone Encounter (Signed)
Called and left message to get labs drawn from 7/26

## 2021-05-09 NOTE — Telephone Encounter (Signed)
Pt stated he had labs drawn 2 months ago/ he stated she cant afford to do labs again on a fixed income / please advise

## 2021-05-25 DIAGNOSIS — L578 Other skin changes due to chronic exposure to nonionizing radiation: Secondary | ICD-10-CM | POA: Diagnosis not present

## 2021-05-25 DIAGNOSIS — Z85828 Personal history of other malignant neoplasm of skin: Secondary | ICD-10-CM | POA: Diagnosis not present

## 2021-05-25 DIAGNOSIS — L570 Actinic keratosis: Secondary | ICD-10-CM | POA: Diagnosis not present

## 2021-05-25 DIAGNOSIS — Z872 Personal history of diseases of the skin and subcutaneous tissue: Secondary | ICD-10-CM | POA: Diagnosis not present

## 2021-05-25 DIAGNOSIS — L400 Psoriasis vulgaris: Secondary | ICD-10-CM | POA: Diagnosis not present

## 2021-06-22 DIAGNOSIS — Z20822 Contact with and (suspected) exposure to covid-19: Secondary | ICD-10-CM | POA: Diagnosis not present

## 2021-06-27 ENCOUNTER — Ambulatory Visit (INDEPENDENT_AMBULATORY_CARE_PROVIDER_SITE_OTHER): Payer: Medicare Other

## 2021-06-27 ENCOUNTER — Other Ambulatory Visit: Payer: Self-pay

## 2021-06-27 DIAGNOSIS — L821 Other seborrheic keratosis: Secondary | ICD-10-CM | POA: Diagnosis not present

## 2021-06-27 DIAGNOSIS — Z23 Encounter for immunization: Secondary | ICD-10-CM

## 2021-06-27 DIAGNOSIS — L570 Actinic keratosis: Secondary | ICD-10-CM | POA: Diagnosis not present

## 2021-08-08 ENCOUNTER — Ambulatory Visit: Payer: Medicare Other

## 2021-08-17 ENCOUNTER — Other Ambulatory Visit: Payer: Self-pay

## 2021-08-17 ENCOUNTER — Encounter: Payer: Self-pay | Admitting: Urology

## 2021-08-17 ENCOUNTER — Ambulatory Visit (INDEPENDENT_AMBULATORY_CARE_PROVIDER_SITE_OTHER): Payer: Medicare Other | Admitting: Urology

## 2021-08-17 VITALS — BP 168/78 | HR 62 | Ht 75.0 in | Wt 240.0 lb

## 2021-08-17 DIAGNOSIS — N3941 Urge incontinence: Secondary | ICD-10-CM | POA: Diagnosis not present

## 2021-08-17 DIAGNOSIS — R3915 Urgency of urination: Secondary | ICD-10-CM

## 2021-08-17 LAB — MICROSCOPIC EXAMINATION

## 2021-08-17 LAB — URINALYSIS, COMPLETE
Bilirubin, UA: NEGATIVE
Glucose, UA: NEGATIVE
Nitrite, UA: NEGATIVE
RBC, UA: NEGATIVE
Specific Gravity, UA: 1.02 (ref 1.005–1.030)
Urobilinogen, Ur: 4 mg/dL — ABNORMAL HIGH (ref 0.2–1.0)
pH, UA: 6 (ref 5.0–7.5)

## 2021-08-17 LAB — BLADDER SCAN AMB NON-IMAGING: Scan Result: 50

## 2021-08-17 MED ORDER — SULFAMETHOXAZOLE-TRIMETHOPRIM 800-160 MG PO TABS: 1.0000 | ORAL_TABLET | Freq: Two times a day (BID) | ORAL | 0 refills | Status: DC

## 2021-08-17 MED ORDER — MIRABEGRON ER 50 MG PO TB24: 50.0000 mg | ORAL_TABLET | Freq: Every day | ORAL | 0 refills | Status: DC

## 2021-08-17 NOTE — Progress Notes (Addendum)
08/17/2021 11:03 AM   Joe Patton [address] Jr[address] [birth year] [id number]  Referring provider: Juline Patch, MD [address] [name],  [place] [address]  Chief Complaint  Patient presents with   Other    HPI: 75 y.o. male called for an appointment for worsening lower urinary tract symptoms.  ~ 2 month history of urinary urgency with occasional episodes of urge incontinence No dysuria, hematuria Denies flank, abdominal or pelvic pain Remains on tamsulosin 0.4 mg daily Status post cystolitholapaxy May 2022   PMH: Past Medical History:  Diagnosis Date   Actinic keratosis    Allergic rhinitis    Anxiety    Arthritis    Bilirubinuria    BPH (benign prostatic hyperplasia)    Degeneration, intervertebral disc, lumbosacral    Erectile dysfunction    Eustachian tube dysfunction    Fibromyalgia    GERD (gastroesophageal reflux disease)    OTC if needed Pepcid   Heart murmur    Hypertension    Osteoarthrosis    Sinusitis    Sleep apnea    uses CPAP, will bring mask   Viral warts     Surgical History: Past Surgical History:  Procedure Laterality Date   CATARACT EXTRACTION     right   CHOLECYSTECTOMY  2005   COLONOSCOPY  2014   cleared for 10 yrs- Joe Patton   CYSTOSCOPY WITH LITHOLAPAXY N/A 02/15/2021   Procedure: CYSTOSCOPY WITH LITHOLAPAXY;  Surgeon: Joe Sons, MD;  Location: [facility];  Service: Urology;  Laterality: N/A;   EYE SURGERY Right    GALLBLADDER SURGERY     HIP SURGERY     left   JOINT REPLACEMENT     right and left total hips   TONSILLECTOMY     TOTAL SHOULDER ARTHROPLASTY Left 09/03/2014   Procedure: TOTAL SHOULDER ARTHROPLASTY;  Surgeon: Joe Sells, MD;  Location: [facility];  Service: Orthopedics;  Laterality: Left;    Home Medications:  Allergies as of 08/17/2021       Reactions   Tramadol    Anger issues   Latex Rash        Medication List        Accurate as of August 17, 2021 11:03 AM. If you have any  questions, ask your nurse or doctor.          amLODipine 10 MG tablet Commonly known as: Joe Patton Take 10 mg by mouth daily.   cholecalciferol 25 MCG (1000 UNIT) tablet Commonly known as: Joe Patton Take 1,000 Units by mouth daily.   citalopram 20 MG tablet Commonly known as: Joe Patton Take 1 tablet (20 mg total) by mouth daily.   labetalol 100 MG tablet Commonly known as: Joe Patton Take 100 mg by mouth daily.   losartan 100 MG tablet Commonly known as: Joe Patton Take 100 mg by mouth daily.   Multi-Vitamins Tabs Take 1 tablet by mouth daily.   sildenafil 20 MG tablet Commonly known as: Joe Patton TAKE 2-5 TABLETS 1 HOUR PRIOR TO INTERCOURSE   sodium chloride 0.65 % Soln nasal spray Commonly known as: Joe Patton Place 1 spray into both nostrils as needed for congestion.   tamsulosin 0.4 MG Caps capsule Commonly known as: Joe Patton Take 0.4 mg by mouth daily.   Joe E 1000 UNIT capsule Take 1,000 Units by mouth daily.        Allergies:  Allergies  Allergen Reactions   Tramadol     Anger issues   Latex Rash    Family  History: Family History  Problem Relation Age of Onset   Breast cancer Mother    Hypertension Father    Heart attack Father    Stroke Father     Social History:  reports that he quit smoking about 13 years ago. His smoking use included cigarettes. He has a 30.00 pack-year smoking history. He has never used smokeless tobacco. He reports that he does not drink alcohol and does not use drugs.   Physical Exam: BP (!) 168/78   Pulse 62   Ht 6\' 3"  (1.905 m)   Wt 240 lb (108.9 kg)   BMI 30.00 kg/m   Constitutional:  Alert and oriented, No acute distress. HEENT: [place] AT, moist mucus membranes.  Trachea midline, no masses. Cardiovascular: No clubbing, cyanosis, or edema. Respiratory: Normal respiratory effort, no increased work of breathing. GI: Abdomen is soft, nontender, nondistended, no abdominal masses Skin: No rashes, bruises or suspicious  lesions. Neurologic: Grossly intact, no focal deficits, moving all 4 extremities. Psychiatric: Normal mood and affect.  Laboratory Data:  Urinalysis Pending   Assessment & Plan:    1.  Lower urinary tract symptoms New problem UA pending Recent onset of increased urgency with urge incontinence Bladder scan PVR 50 mL Trial Joe Patton 50 mg daily-samples given x28 days  Addendum: Urinalysis with 11-30 WBCs.  Urine culture was ordered.  Rx Septra DS sent to pharmacy pending culture results   Joe Patton, [name] [address], [place], [place] [address] [phone]

## 2021-08-17 NOTE — Addendum Note (Signed)
Addended by: Chrystie Nose on: 08/17/2021 01:33 PM   Modules accepted: Orders

## 2021-08-23 ENCOUNTER — Telehealth: Payer: Self-pay | Admitting: *Deleted

## 2021-08-23 LAB — CULTURE, URINE COMPREHENSIVE

## 2021-08-23 NOTE — Telephone Encounter (Signed)
-----   Message from [name], MD sent at 08/23/2021  1:00 PM EST ----- Urine culture was positive and sensitive to prescribed antibiotic

## 2021-08-23 NOTE — Telephone Encounter (Signed)
Notified patient as instructed, patient pleased °

## 2021-09-01 DIAGNOSIS — M25561 Pain in right knee: Secondary | ICD-10-CM | POA: Diagnosis not present

## 2021-09-01 DIAGNOSIS — M25562 Pain in left knee: Secondary | ICD-10-CM | POA: Diagnosis not present

## 2021-09-08 ENCOUNTER — Telehealth: Payer: Self-pay | Admitting: Family Medicine

## 2021-09-08 NOTE — Telephone Encounter (Signed)
Copied from [place] [id number]. Topic: Medicare AWV >> Sep 08, 2021  9:23 AM Cher Nakai R wrote: Reason for CRM:  Left message for patient to call back and schedule Medicare Annual Wellness Visit (AWV). P Please offer to do virtually or by telephone.  Last AWV: 08/04/2020  Please schedule at anytime with [facility] Health Advisor.      40 Minutes appointment   Any questions, please call me at [phone]

## 2021-09-14 ENCOUNTER — Other Ambulatory Visit: Payer: Self-pay

## 2021-09-14 ENCOUNTER — Ambulatory Visit (INDEPENDENT_AMBULATORY_CARE_PROVIDER_SITE_OTHER): Payer: Medicare Other | Admitting: Urology

## 2021-09-14 ENCOUNTER — Encounter: Payer: Self-pay | Admitting: Urology

## 2021-09-14 VITALS — BP 199/92 | HR 61 | Ht 75.0 in | Wt 240.0 lb

## 2021-09-14 DIAGNOSIS — N5201 Erectile dysfunction due to arterial insufficiency: Secondary | ICD-10-CM

## 2021-09-14 DIAGNOSIS — Z87442 Personal history of urinary calculi: Secondary | ICD-10-CM | POA: Diagnosis not present

## 2021-09-14 DIAGNOSIS — N401 Enlarged prostate with lower urinary tract symptoms: Secondary | ICD-10-CM

## 2021-09-14 NOTE — Progress Notes (Signed)
09/14/2021 1:25 PM   Joe Patton [address] [place]. 1946-05-17 [id number]  Referring provider: Juline Patch, MD [address] [place],  [place] [address]  Chief Complaint  Patient presents with   Follow-up    Urologic history: 1.  BPH with lower urinary tract symptoms             -PVP January 2014             -has urgency and nocturia, no obstructive symptoms             -Cystoscopy 12/2019 with right lateral lobe regrowth             -TRUS volume 24 cc             -On Myrbetriq    2.  Erectile dysfunction             -On generic sildenafil  3.  Prostatic calculus -Irritative voiding symptoms 12/2020 -Found to have a 3 cm prostatic calculus  -Cystolitholapaxy 01/2021  HPI: 75 y.o. male presents for follow-up.  Seen 08/17/2021 with irritative voiding symptoms UA with pyuria and positive urine culture Symptoms resolved with antibiotic Baseline symptoms stable; remains on Myrbetriq No longer taking tamsulosin   PMH: Past Medical History:  Diagnosis Date   Actinic keratosis    Allergic rhinitis    Anxiety    Arthritis    Bilirubinuria    BPH (benign prostatic hyperplasia)    Degeneration, intervertebral disc, lumbosacral    Erectile dysfunction    Eustachian tube dysfunction    Fibromyalgia    GERD (gastroesophageal reflux disease)    OTC if needed Pepcid   Heart murmur    Hypertension    Osteoarthrosis    Sinusitis    Sleep apnea    uses CPAP, will bring mask   Viral warts     Surgical History: Past Surgical History:  Procedure Laterality Date   CATARACT EXTRACTION     right   CHOLECYSTECTOMY  2005   COLONOSCOPY  2014   cleared for 10 yrs- Dr Allen Norris   CYSTOSCOPY WITH LITHOLAPAXY N/A 02/15/2021   Procedure: CYSTOSCOPY WITH LITHOLAPAXY;  Surgeon: [name], MD;  Location: [facility];  Service: Urology;  Laterality: N/A;   EYE SURGERY Right    GALLBLADDER SURGERY     HIP SURGERY     left   JOINT REPLACEMENT     right and left total hips    TONSILLECTOMY     TOTAL SHOULDER ARTHROPLASTY Left 09/03/2014   Procedure: TOTAL SHOULDER ARTHROPLASTY;  Surgeon: Nita Sells, MD;  Location: [facility];  Service: Orthopedics;  Laterality: Left;    Home Medications:  Allergies as of 09/14/2021       Reactions   Tramadol    Anger issues   Latex Rash        Medication List        Accurate as of September 14, 2021  1:25 PM. If you have any questions, ask your nurse or doctor.          STOP taking these medications    sulfamethoxazole-trimethoprim 800-160 MG tablet Commonly known as: BACTRIM DS Stopped by: [name], MD       TAKE these medications    amLODipine 10 MG tablet Commonly known as: NORVASC Take 10 mg by mouth daily.   cholecalciferol 25 MCG (1000 UNIT) tablet Commonly known as: VITAMIN D3 Take 1,000 Units by mouth daily.   citalopram 20  MG tablet Commonly known as: CELEXA Take 1 tablet (20 mg total) by mouth daily.   labetalol 100 MG tablet Commonly known as: NORMODYNE Take 100 mg by mouth daily.   losartan 100 MG tablet Commonly known as: COZAAR Take 100 mg by mouth daily.   mirabegron ER 50 MG Tb24 tablet Commonly known as: MYRBETRIQ Take 1 tablet (50 mg total) by mouth daily.   Multi-Vitamins Tabs Take 1 tablet by mouth daily.   sildenafil 20 MG tablet Commonly known as: REVATIO TAKE 2-5 TABLETS 1 HOUR PRIOR TO INTERCOURSE   sodium chloride 0.65 % Soln nasal spray Commonly known as: OCEAN Place 1 spray into both nostrils as needed for congestion.   tamsulosin 0.4 MG Caps capsule Commonly known as: FLOMAX Take 0.4 mg by mouth daily.   vitamin E 1000 UNIT capsule Take 1,000 Units by mouth daily.        Allergies:  Allergies  Allergen Reactions   Tramadol     Anger issues   Latex Rash    Family History: Family History  Problem Relation Age of Onset   Breast cancer Mother    Hypertension Father    Heart attack Father    Stroke Father     Social  History:  reports that he quit smoking about 13 years ago. His smoking use included cigarettes. He has a 30.00 pack-year smoking history. He has never used smokeless tobacco. He reports that he does not drink alcohol and does not use drugs.   Physical Exam: BP (!) 199/92    Pulse 61    Ht 6\' 3"  (1.905 m)    Wt 240 lb (108.9 kg)    BMI 30.00 kg/m   Constitutional:  Alert and oriented, No acute distress. HEENT: [place] AT, moist mucus membranes.  Trachea midline, no masses. Cardiovascular: No clubbing, cyanosis, or edema. Respiratory: Normal respiratory effort, no increased work of breathing. Psychiatric: Normal mood and affect.   Assessment & Plan:    1.  Lower urinary tract symptoms Stable on Myrbetriq  2.  Erectile dysfunction Stable  3.  History prostatic calculus  KUB ordered  Continue annual follow-up   [name], MD  [place] [address] [place], [place] [address] [phone]

## 2021-10-04 ENCOUNTER — Encounter: Payer: Self-pay | Admitting: Family Medicine

## 2021-10-04 ENCOUNTER — Ambulatory Visit (INDEPENDENT_AMBULATORY_CARE_PROVIDER_SITE_OTHER): Payer: Medicare Other | Admitting: Family Medicine

## 2021-10-04 ENCOUNTER — Other Ambulatory Visit: Payer: Self-pay

## 2021-10-04 VITALS — BP 120/80 | HR 84 | Ht 76.0 in | Wt 236.0 lb

## 2021-10-04 DIAGNOSIS — Z8639 Personal history of other endocrine, nutritional and metabolic disease: Secondary | ICD-10-CM | POA: Diagnosis not present

## 2021-10-04 DIAGNOSIS — Z862 Personal history of diseases of the blood and blood-forming organs and certain disorders involving the immune mechanism: Secondary | ICD-10-CM

## 2021-10-04 DIAGNOSIS — Z01818 Encounter for other preprocedural examination: Secondary | ICD-10-CM

## 2021-10-04 DIAGNOSIS — Z6830 Body mass index (BMI) 30.0-30.9, adult: Secondary | ICD-10-CM | POA: Diagnosis not present

## 2021-10-04 NOTE — Progress Notes (Signed)
Date:  10/04/2021   Name:  [facility] Ilan Kahrs.   DOB:  1946-07-22   MRN:  [id number]   Chief Complaint: Joe Patton. is a 76 y.o. male who presents today for his pre-op Exam. He feels well. He reports exercising limited due to knee/ leg pain. He reports he is sleeping well.     Lab Results  Component Value Date   NA 144 02/11/2021   K 3.3 (L) 02/11/2021   CO2 28 02/11/2021   GLUCOSE 82 02/11/2021   BUN 15 02/11/2021   CREATININE 1.09 02/11/2021   CALCIUM 8.9 02/11/2021   GFRNONAA >60 02/11/2021   No results found for: CHOL, HDL, LDLCALC, LDLDIRECT, TRIG, CHOLHDL No results found for: TSH No results found for: HGBA1C Lab Results  Component Value Date   WBC 7.4 02/11/2021   HGB 13.3 02/11/2021   HCT 39.1 02/11/2021   MCV 89.5 02/11/2021   PLT 213 02/11/2021   Lab Results  Component Value Date   ALT 21 08/25/2014   AST 21 08/25/2014   ALKPHOS 84 08/25/2014   BILITOT 0.5 08/25/2014   No results found for: 25OHVITD2, 25OHVITD3, VD25OH   Review of Systems  Constitutional:  Negative for chills and fever.  HENT:  Negative for drooling, ear discharge, ear pain and sore throat.   Respiratory:  Negative for cough, shortness of breath and wheezing.   Cardiovascular:  Negative for chest pain, palpitations and leg swelling.  Gastrointestinal:  Negative for abdominal pain, blood in stool, constipation, diarrhea and nausea.  Endocrine: Negative for polydipsia.  Genitourinary:  Negative for dysuria, frequency, hematuria and urgency.  Musculoskeletal:  Negative for back pain, myalgias and neck pain.  Skin:  Negative for rash.  Allergic/Immunologic: Negative for environmental allergies.  Neurological:  Negative for dizziness and headaches.  Hematological:  Does not bruise/bleed easily.  Psychiatric/Behavioral:  Negative for suicidal ideas. The patient is not nervous/anxious.    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   Basal cell papilloma 12/11/2018   Bilirubin in urine 12/11/2018   Chronic venous insufficiency 12/11/2018   Coxitis 12/11/2018   DDD (degenerative disc disease), lumbosacral 12/11/2018   Dermatitis, stasis 12/11/2018   Dysfunction of eustachian tube 12/11/2018   Flu vaccine need 12/11/2018   Heart murmur 12/11/2018   Hypertension 12/11/2018   Impingement syndrome of shoulder 12/11/2018   Insect bite, nonvenomous 12/11/2018   Need for vaccination 12/11/2018   Preop examination 12/11/2018   Sinus infection 12/11/2018   Recurrent major depressive disorder, in full remission ([place]) 05/21/2018   Benign prostatic hyperplasia with lower urinary tract symptoms 12/05/2017   Erectile dysfunction 12/05/2017   Condyloma acuminatum 09/04/2017   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   Presence of artificial shoulder joint 09/03/2014   Osteoarthritis of both knees 07/07/2014   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   OSA (obstructive sleep apnea) 05/08/2014   Abnormal EKG 05/06/2014   Depression 05/06/2014   Obesity 05/06/2014   Urinary urgency 07/22/2012    Allergies  Allergen Reactions   Tramadol     Anger issues   Latex Rash    Past Surgical History:  Procedure Laterality Date   CATARACT EXTRACTION     right   CHOLECYSTECTOMY  2005   COLONOSCOPY  2014   cleared for 10 yrs- Dr Allen Norris   CYSTOSCOPY WITH LITHOLAPAXY N/A 02/15/2021  Procedure: CYSTOSCOPY WITH LITHOLAPAXY;  Surgeon: [name], MD;  Location: [facility];  Service: Urology;  Laterality: N/A;   EYE SURGERY Right    GALLBLADDER SURGERY     HIP SURGERY     left   JOINT REPLACEMENT     right and left total hips   TONSILLECTOMY     TOTAL SHOULDER ARTHROPLASTY Left 09/03/2014   Procedure: TOTAL SHOULDER ARTHROPLASTY;  Surgeon: Nita Sells, MD;  Location: [facility];  Service: Orthopedics;  Laterality: Left;     Social History   Tobacco Use   Smoking status: Former    Packs/day: 1.50    Years: 20.00    Pack years: 30.00    Types: Cigarettes    Quit date: 10/03/2007    Years since quitting: 14.0   Smokeless tobacco: Never   Tobacco comments:    Has not resumed smoking. No need to provide pt with smoking cessation materials  Vaping Use   Vaping Use: Never used  Substance Use Topics   Alcohol use: No   Drug use: No     Medication list has been reviewed and updated.  No outpatient medications have been marked as taking for the 10/04/21 encounter (Office Visit) with Juline Patch, MD.    [facility] 2/9 Scores 10/04/2021 04/26/2021 11/12/2020 08/04/2020  PHQ - 2 Score 0 0 0 0  PHQ- 9 Score 0 0 0 -    GAD 7 : Generalized Anxiety Score 10/04/2021 04/26/2021 11/12/2020 05/06/2020  Nervous, Anxious, on Edge 0 0 0 0  Control/stop worrying 0 0 0 0  Worry too much - different things 0 0 0 0  Trouble relaxing 0 0 0 0  Restless 0 0 0 0  Easily annoyed or irritable 0 0 0 0  Afraid - awful might happen 0 0 0 0  Total GAD 7 Score 0 0 0 0  Anxiety Difficulty Not difficult at all - - Not difficult at all    BP Readings from Last 3 Encounters:  10/04/21 120/80  09/14/21 (!) 199/92  08/17/21 (!) 168/78    Physical Exam Vitals and nursing note reviewed.  HENT:     Head: Normocephalic.     Right Ear: External ear normal.     Left Ear: External ear normal.     Nose: Nose normal.  Eyes:     General: No scleral icterus.       Right eye: No discharge.        Left eye: No discharge.     Conjunctiva/sclera: Conjunctivae normal.     Pupils: Pupils are equal, round, and reactive to light.  Neck:     Thyroid: No thyromegaly.     Vascular: No JVD.     Trachea: No tracheal deviation.  Cardiovascular:     Rate and Rhythm: Normal rate and regular rhythm.     Heart sounds: Normal heart sounds. No murmur heard.   No friction rub. No gallop.  Pulmonary:     Effort: No respiratory distress.     Breath  sounds: Normal breath sounds. No wheezing or rales.  Abdominal:     General: Bowel sounds are normal.     Palpations: Abdomen is soft. There is no mass.     Tenderness: There is no abdominal tenderness. There is no guarding or rebound.  Musculoskeletal:        General: No tenderness. Normal range of motion.     Cervical back: Normal range of motion and neck supple.  Lymphadenopathy:     Cervical: No cervical adenopathy.  Skin:    General: Skin is warm.     Findings: No rash.  Neurological:     Mental Status: He is alert and oriented to person, place, and time.     Cranial Nerves: No cranial nerve deficit.     Deep Tendon Reflexes: Reflexes are normal and symmetric.    Wt Readings from Last 3 Encounters:  10/04/21 236 lb (107 kg)  09/14/21 240 lb (108.9 kg)  08/17/21 240 lb (108.9 kg)    BP 120/80    Pulse 84    Ht 6\' 4"  (1.93 m)    Wt 236 lb (107 kg)    BMI 28.73 kg/m   Assessment and Plan:  1. Preop examination Patient with upcoming knee surgery requiring preop examination.  Preoperative examination was normal with no contraindication to surgery noted on exam.  Labs were obtained as recommended. - CBC w/Diff/Platelet - Comprehensive Metabolic Panel (CMET) - INR/PT - Hemoglobin A1c  2. BMI 30.0-30.9,adult Health risks of being over weight were discussed and patient was counseled on weight loss options and exercise.   3. History of hyperglycemia Patient with history of remote hyperglycemia.  A1c was obtained with result of 5.1. - Hemoglobin A1c  4. History of anemia Patient with history of anemia CBC was obtained and hemoglobin hematocrit were in normal range. - CBC w/Diff/Platelet - INR/PT

## 2021-10-05 ENCOUNTER — Other Ambulatory Visit: Payer: Self-pay

## 2021-10-05 ENCOUNTER — Encounter: Payer: Self-pay | Admitting: Family Medicine

## 2021-10-05 DIAGNOSIS — E876 Hypokalemia: Secondary | ICD-10-CM

## 2021-10-05 LAB — CBC WITH DIFFERENTIAL/PLATELET
Basophils Absolute: 0.1 10*3/uL (ref 0.0–0.2)
Basos: 1 %
EOS (ABSOLUTE): 0.3 10*3/uL (ref 0.0–0.4)
Eos: 4 %
Hematocrit: 42.6 % (ref 37.5–51.0)
Hemoglobin: 14.4 g/dL (ref 13.0–17.7)
Immature Grans (Abs): 0 10*3/uL (ref 0.0–0.1)
Immature Granulocytes: 0 %
Lymphocytes Absolute: 2 10*3/uL (ref 0.7–3.1)
Lymphs: 27 %
MCH: 30.2 pg (ref 26.6–33.0)
MCHC: 33.8 g/dL (ref 31.5–35.7)
MCV: 89 fL (ref 79–97)
Monocytes Absolute: 0.6 10*3/uL (ref 0.1–0.9)
Monocytes: 8 %
Neutrophils Absolute: 4.4 10*3/uL (ref 1.4–7.0)
Neutrophils: 60 %
Platelets: 246 10*3/uL (ref 150–450)
RBC: 4.77 x10E6/uL (ref 4.14–5.80)
RDW: 12.4 % (ref 11.6–15.4)
WBC: 7.5 10*3/uL (ref 3.4–10.8)

## 2021-10-05 LAB — COMPREHENSIVE METABOLIC PANEL
ALT: 16 IU/L (ref 0–44)
AST: 20 IU/L (ref 0–40)
Albumin/Globulin Ratio: 1.8 (ref 1.2–2.2)
Albumin: 4.4 g/dL (ref 3.7–4.7)
Alkaline Phosphatase: 83 IU/L (ref 44–121)
BUN/Creatinine Ratio: 9 — ABNORMAL LOW (ref 10–24)
BUN: 11 mg/dL (ref 8–27)
Bilirubin Total: 0.6 mg/dL (ref 0.0–1.2)
CO2: 26 mmol/L (ref 20–29)
Calcium: 8.9 mg/dL (ref 8.6–10.2)
Chloride: 102 mmol/L (ref 96–106)
Creatinine, Ser: 1.19 mg/dL (ref 0.76–1.27)
Globulin, Total: 2.4 g/dL (ref 1.5–4.5)
Glucose: 88 mg/dL (ref 70–99)
Potassium: 3.4 mmol/L — ABNORMAL LOW (ref 3.5–5.2)
Sodium: 142 mmol/L (ref 134–144)
Total Protein: 6.8 g/dL (ref 6.0–8.5)
eGFR: 64 mL/min/{1.73_m2} (ref >59)

## 2021-10-05 LAB — PROTIME-INR
INR: 1 (ref 0.9–1.2)
Prothrombin Time: 10.5 s (ref 9.1–12.0)

## 2021-10-05 LAB — HEMOGLOBIN A1C
Est. average glucose Bld gHb Est-mCnc: 100 mg/dL
Hgb A1c MFr Bld: 5.1 % (ref 4.8–5.6)

## 2021-10-05 MED ORDER — POTASSIUM CHLORIDE CRYS ER 10 MEQ PO TBCR: 10.0000 meq | EXTENDED_RELEASE_TABLET | Freq: Every day | ORAL | 1 refills | Status: DC

## 2021-10-06 DIAGNOSIS — R9431 Abnormal electrocardiogram [ECG] [EKG]: Secondary | ICD-10-CM | POA: Diagnosis not present

## 2021-10-06 DIAGNOSIS — I10 Essential (primary) hypertension: Secondary | ICD-10-CM | POA: Diagnosis not present

## 2021-10-06 DIAGNOSIS — Z6832 Body mass index (BMI) 32.0-32.9, adult: Secondary | ICD-10-CM | POA: Diagnosis not present

## 2021-10-06 DIAGNOSIS — Z01818 Encounter for other preprocedural examination: Secondary | ICD-10-CM | POA: Diagnosis not present

## 2021-10-06 DIAGNOSIS — R011 Cardiac murmur, unspecified: Secondary | ICD-10-CM | POA: Diagnosis not present

## 2021-10-06 DIAGNOSIS — G4733 Obstructive sleep apnea (adult) (pediatric): Secondary | ICD-10-CM | POA: Diagnosis not present

## 2021-10-06 DIAGNOSIS — E669 Obesity, unspecified: Secondary | ICD-10-CM | POA: Diagnosis not present

## 2021-10-12 DIAGNOSIS — M1711 Unilateral primary osteoarthritis, right knee: Secondary | ICD-10-CM | POA: Diagnosis not present

## 2021-10-12 DIAGNOSIS — R531 Weakness: Secondary | ICD-10-CM | POA: Diagnosis not present

## 2021-10-21 DIAGNOSIS — G8918 Other acute postprocedural pain: Secondary | ICD-10-CM | POA: Diagnosis not present

## 2021-10-21 DIAGNOSIS — M1711 Unilateral primary osteoarthritis, right knee: Secondary | ICD-10-CM | POA: Diagnosis not present

## 2021-10-21 DIAGNOSIS — M21161 Varus deformity, not elsewhere classified, right knee: Secondary | ICD-10-CM | POA: Diagnosis not present

## 2021-10-21 DIAGNOSIS — Z96651 Presence of right artificial knee joint: Secondary | ICD-10-CM | POA: Diagnosis not present

## 2021-10-24 DIAGNOSIS — M25661 Stiffness of right knee, not elsewhere classified: Secondary | ICD-10-CM | POA: Diagnosis not present

## 2021-10-24 DIAGNOSIS — R2681 Unsteadiness on feet: Secondary | ICD-10-CM | POA: Diagnosis not present

## 2021-10-24 DIAGNOSIS — M25561 Pain in right knee: Secondary | ICD-10-CM | POA: Diagnosis not present

## 2021-11-01 DIAGNOSIS — M1712 Unilateral primary osteoarthritis, left knee: Secondary | ICD-10-CM | POA: Diagnosis not present

## 2021-11-01 DIAGNOSIS — Z9889 Other specified postprocedural states: Secondary | ICD-10-CM | POA: Diagnosis not present

## 2021-11-14 ENCOUNTER — Telehealth: Payer: Self-pay

## 2021-11-14 NOTE — Telephone Encounter (Signed)
Copied from [place] [id number]. Topic: General - Other >> Nov 14, 2021  8:56 AM Tessa Lerner A wrote: Reason for CRM: The patient would like to speak with a member of staff regarding surgical clearance for their left knee surgery scheduled 12/09/21  An additional clearance form was submitted to the practice but the patient has not heard further regarding this clearance   Please contact further when available

## 2021-11-28 ENCOUNTER — Other Ambulatory Visit: Payer: Self-pay | Admitting: Family Medicine

## 2021-11-28 DIAGNOSIS — F419 Anxiety disorder, unspecified: Secondary | ICD-10-CM

## 2021-11-28 DIAGNOSIS — F3342 Major depressive disorder, recurrent, in full remission: Secondary | ICD-10-CM

## 2021-11-28 DIAGNOSIS — L578 Other skin changes due to chronic exposure to nonionizing radiation: Secondary | ICD-10-CM | POA: Diagnosis not present

## 2021-11-28 DIAGNOSIS — L720 Epidermal cyst: Secondary | ICD-10-CM | POA: Diagnosis not present

## 2021-11-28 DIAGNOSIS — Z872 Personal history of diseases of the skin and subcutaneous tissue: Secondary | ICD-10-CM | POA: Diagnosis not present

## 2021-11-28 DIAGNOSIS — L089 Local infection of the skin and subcutaneous tissue, unspecified: Secondary | ICD-10-CM | POA: Diagnosis not present

## 2021-11-28 DIAGNOSIS — L821 Other seborrheic keratosis: Secondary | ICD-10-CM | POA: Diagnosis not present

## 2021-11-28 DIAGNOSIS — Z85828 Personal history of other malignant neoplasm of skin: Secondary | ICD-10-CM | POA: Diagnosis not present

## 2021-11-29 NOTE — Telephone Encounter (Signed)
Requested Prescriptions  Pending Prescriptions Disp Refills   citalopram (CELEXA) 20 MG tablet [Pharmacy Med Name: CITALOPRAM HBR 20 MG TABLET] 30 tablet 0    Sig: Take 1 tablet (20 mg total) by mouth daily.     Psychiatry:  Antidepressants - SSRI Passed - 11/28/2021  8:33 AM      Passed - Completed PHQ-2 or PHQ-9 in the last 360 days      Passed - Valid encounter within last 6 months    Recent Outpatient Visits          1 month ago Preop examination   [facility] Juline Patch, MD   7 months ago Recurrent major depressive disorder, in full remission ([place])   [facility] Juline Patch, MD   1 year ago Recurrent major depressive disorder, in full remission ([place])   [facility] Juline Patch, MD   1 year ago Recurrent major depressive disorder, in full remission [facility])   [facility] Juline Patch, MD   2 years ago Recurrent major depressive disorder, in full remission [facility])   [place], MD      Future Appointments            In 9 months [place], Ronda Fairly, MD [place]

## 2021-11-30 DIAGNOSIS — M1712 Unilateral primary osteoarthritis, left knee: Secondary | ICD-10-CM | POA: Diagnosis not present

## 2021-12-09 DIAGNOSIS — M21062 Valgus deformity, not elsewhere classified, left knee: Secondary | ICD-10-CM | POA: Diagnosis not present

## 2021-12-09 DIAGNOSIS — M1712 Unilateral primary osteoarthritis, left knee: Secondary | ICD-10-CM | POA: Diagnosis not present

## 2021-12-09 DIAGNOSIS — G8918 Other acute postprocedural pain: Secondary | ICD-10-CM | POA: Diagnosis not present

## 2021-12-09 DIAGNOSIS — Z96652 Presence of left artificial knee joint: Secondary | ICD-10-CM | POA: Diagnosis not present

## 2021-12-12 DIAGNOSIS — R2681 Unsteadiness on feet: Secondary | ICD-10-CM | POA: Diagnosis not present

## 2021-12-12 DIAGNOSIS — R262 Difficulty in walking, not elsewhere classified: Secondary | ICD-10-CM | POA: Diagnosis not present

## 2021-12-12 DIAGNOSIS — M25562 Pain in left knee: Secondary | ICD-10-CM | POA: Diagnosis not present

## 2021-12-12 DIAGNOSIS — M25662 Stiffness of left knee, not elsewhere classified: Secondary | ICD-10-CM | POA: Diagnosis not present

## 2021-12-16 DIAGNOSIS — M25662 Stiffness of left knee, not elsewhere classified: Secondary | ICD-10-CM | POA: Diagnosis not present

## 2021-12-16 DIAGNOSIS — R2681 Unsteadiness on feet: Secondary | ICD-10-CM | POA: Diagnosis not present

## 2021-12-16 DIAGNOSIS — M25562 Pain in left knee: Secondary | ICD-10-CM | POA: Diagnosis not present

## 2021-12-16 DIAGNOSIS — R262 Difficulty in walking, not elsewhere classified: Secondary | ICD-10-CM | POA: Diagnosis not present

## 2021-12-19 DIAGNOSIS — M25662 Stiffness of left knee, not elsewhere classified: Secondary | ICD-10-CM | POA: Diagnosis not present

## 2021-12-19 DIAGNOSIS — M25562 Pain in left knee: Secondary | ICD-10-CM | POA: Diagnosis not present

## 2021-12-19 DIAGNOSIS — R2681 Unsteadiness on feet: Secondary | ICD-10-CM | POA: Diagnosis not present

## 2021-12-19 DIAGNOSIS — Z20822 Contact with and (suspected) exposure to covid-19: Secondary | ICD-10-CM | POA: Diagnosis not present

## 2021-12-19 DIAGNOSIS — R262 Difficulty in walking, not elsewhere classified: Secondary | ICD-10-CM | POA: Diagnosis not present

## 2021-12-20 DIAGNOSIS — Z9889 Other specified postprocedural states: Secondary | ICD-10-CM | POA: Diagnosis not present

## 2021-12-20 DIAGNOSIS — M1712 Unilateral primary osteoarthritis, left knee: Secondary | ICD-10-CM | POA: Diagnosis not present

## 2021-12-22 DIAGNOSIS — M25562 Pain in left knee: Secondary | ICD-10-CM | POA: Diagnosis not present

## 2021-12-22 DIAGNOSIS — R262 Difficulty in walking, not elsewhere classified: Secondary | ICD-10-CM | POA: Diagnosis not present

## 2021-12-22 DIAGNOSIS — M25662 Stiffness of left knee, not elsewhere classified: Secondary | ICD-10-CM | POA: Diagnosis not present

## 2021-12-22 DIAGNOSIS — R2681 Unsteadiness on feet: Secondary | ICD-10-CM | POA: Diagnosis not present

## 2021-12-26 DIAGNOSIS — M25562 Pain in left knee: Secondary | ICD-10-CM | POA: Diagnosis not present

## 2021-12-26 DIAGNOSIS — R262 Difficulty in walking, not elsewhere classified: Secondary | ICD-10-CM | POA: Diagnosis not present

## 2021-12-26 DIAGNOSIS — R2681 Unsteadiness on feet: Secondary | ICD-10-CM | POA: Diagnosis not present

## 2021-12-26 DIAGNOSIS — M25662 Stiffness of left knee, not elsewhere classified: Secondary | ICD-10-CM | POA: Diagnosis not present

## 2021-12-28 DIAGNOSIS — R21 Rash and other nonspecific skin eruption: Secondary | ICD-10-CM | POA: Diagnosis not present

## 2021-12-29 ENCOUNTER — Ambulatory Visit: Payer: Self-pay | Admitting: Urology

## 2021-12-29 DIAGNOSIS — M25562 Pain in left knee: Secondary | ICD-10-CM | POA: Diagnosis not present

## 2021-12-29 DIAGNOSIS — R262 Difficulty in walking, not elsewhere classified: Secondary | ICD-10-CM | POA: Diagnosis not present

## 2021-12-29 DIAGNOSIS — R2681 Unsteadiness on feet: Secondary | ICD-10-CM | POA: Diagnosis not present

## 2021-12-29 DIAGNOSIS — M25662 Stiffness of left knee, not elsewhere classified: Secondary | ICD-10-CM | POA: Diagnosis not present

## 2022-01-02 DIAGNOSIS — R2681 Unsteadiness on feet: Secondary | ICD-10-CM | POA: Diagnosis not present

## 2022-01-02 DIAGNOSIS — M25562 Pain in left knee: Secondary | ICD-10-CM | POA: Diagnosis not present

## 2022-01-02 DIAGNOSIS — M25662 Stiffness of left knee, not elsewhere classified: Secondary | ICD-10-CM | POA: Diagnosis not present

## 2022-01-02 DIAGNOSIS — R262 Difficulty in walking, not elsewhere classified: Secondary | ICD-10-CM | POA: Diagnosis not present

## 2022-01-05 DIAGNOSIS — R2681 Unsteadiness on feet: Secondary | ICD-10-CM | POA: Diagnosis not present

## 2022-01-05 DIAGNOSIS — R262 Difficulty in walking, not elsewhere classified: Secondary | ICD-10-CM | POA: Diagnosis not present

## 2022-01-05 DIAGNOSIS — M25562 Pain in left knee: Secondary | ICD-10-CM | POA: Diagnosis not present

## 2022-01-05 DIAGNOSIS — M25662 Stiffness of left knee, not elsewhere classified: Secondary | ICD-10-CM | POA: Diagnosis not present

## 2022-01-09 DIAGNOSIS — M25662 Stiffness of left knee, not elsewhere classified: Secondary | ICD-10-CM | POA: Diagnosis not present

## 2022-01-09 DIAGNOSIS — R262 Difficulty in walking, not elsewhere classified: Secondary | ICD-10-CM | POA: Diagnosis not present

## 2022-01-09 DIAGNOSIS — R2681 Unsteadiness on feet: Secondary | ICD-10-CM | POA: Diagnosis not present

## 2022-01-09 DIAGNOSIS — M25562 Pain in left knee: Secondary | ICD-10-CM | POA: Diagnosis not present

## 2022-01-12 DIAGNOSIS — Z9889 Other specified postprocedural states: Secondary | ICD-10-CM | POA: Diagnosis not present

## 2022-01-12 DIAGNOSIS — M25562 Pain in left knee: Secondary | ICD-10-CM | POA: Diagnosis not present

## 2022-01-17 DIAGNOSIS — R262 Difficulty in walking, not elsewhere classified: Secondary | ICD-10-CM | POA: Diagnosis not present

## 2022-01-17 DIAGNOSIS — R2681 Unsteadiness on feet: Secondary | ICD-10-CM | POA: Diagnosis not present

## 2022-01-17 DIAGNOSIS — M25662 Stiffness of left knee, not elsewhere classified: Secondary | ICD-10-CM | POA: Diagnosis not present

## 2022-01-17 DIAGNOSIS — M25562 Pain in left knee: Secondary | ICD-10-CM | POA: Diagnosis not present

## 2022-01-18 DIAGNOSIS — L400 Psoriasis vulgaris: Secondary | ICD-10-CM | POA: Diagnosis not present

## 2022-01-18 DIAGNOSIS — D408 Neoplasm of uncertain behavior of other specified male genital organs: Secondary | ICD-10-CM | POA: Diagnosis not present

## 2022-01-18 DIAGNOSIS — L308 Other specified dermatitis: Secondary | ICD-10-CM | POA: Diagnosis not present

## 2022-01-24 DIAGNOSIS — R2681 Unsteadiness on feet: Secondary | ICD-10-CM | POA: Diagnosis not present

## 2022-01-24 DIAGNOSIS — M25662 Stiffness of left knee, not elsewhere classified: Secondary | ICD-10-CM | POA: Diagnosis not present

## 2022-01-24 DIAGNOSIS — R262 Difficulty in walking, not elsewhere classified: Secondary | ICD-10-CM | POA: Diagnosis not present

## 2022-01-24 DIAGNOSIS — M25562 Pain in left knee: Secondary | ICD-10-CM | POA: Diagnosis not present

## 2022-01-27 DIAGNOSIS — R262 Difficulty in walking, not elsewhere classified: Secondary | ICD-10-CM | POA: Diagnosis not present

## 2022-01-27 DIAGNOSIS — R2681 Unsteadiness on feet: Secondary | ICD-10-CM | POA: Diagnosis not present

## 2022-01-27 DIAGNOSIS — M25562 Pain in left knee: Secondary | ICD-10-CM | POA: Diagnosis not present

## 2022-01-27 DIAGNOSIS — M25662 Stiffness of left knee, not elsewhere classified: Secondary | ICD-10-CM | POA: Diagnosis not present

## 2022-01-31 DIAGNOSIS — M25562 Pain in left knee: Secondary | ICD-10-CM | POA: Diagnosis not present

## 2022-01-31 DIAGNOSIS — R262 Difficulty in walking, not elsewhere classified: Secondary | ICD-10-CM | POA: Diagnosis not present

## 2022-01-31 DIAGNOSIS — M25662 Stiffness of left knee, not elsewhere classified: Secondary | ICD-10-CM | POA: Diagnosis not present

## 2022-01-31 DIAGNOSIS — R2681 Unsteadiness on feet: Secondary | ICD-10-CM | POA: Diagnosis not present

## 2022-02-03 DIAGNOSIS — M25562 Pain in left knee: Secondary | ICD-10-CM | POA: Diagnosis not present

## 2022-02-03 DIAGNOSIS — R2681 Unsteadiness on feet: Secondary | ICD-10-CM | POA: Diagnosis not present

## 2022-02-03 DIAGNOSIS — M25662 Stiffness of left knee, not elsewhere classified: Secondary | ICD-10-CM | POA: Diagnosis not present

## 2022-02-03 DIAGNOSIS — R262 Difficulty in walking, not elsewhere classified: Secondary | ICD-10-CM | POA: Diagnosis not present

## 2022-02-07 DIAGNOSIS — R262 Difficulty in walking, not elsewhere classified: Secondary | ICD-10-CM | POA: Diagnosis not present

## 2022-02-07 DIAGNOSIS — R2681 Unsteadiness on feet: Secondary | ICD-10-CM | POA: Diagnosis not present

## 2022-02-07 DIAGNOSIS — M25662 Stiffness of left knee, not elsewhere classified: Secondary | ICD-10-CM | POA: Diagnosis not present

## 2022-02-07 DIAGNOSIS — M25562 Pain in left knee: Secondary | ICD-10-CM | POA: Diagnosis not present

## 2022-02-15 ENCOUNTER — Ambulatory Visit (INDEPENDENT_AMBULATORY_CARE_PROVIDER_SITE_OTHER): Payer: Medicare Other

## 2022-02-15 DIAGNOSIS — Z0000 Encounter for general adult medical examination without abnormal findings: Secondary | ICD-10-CM

## 2022-02-15 NOTE — Progress Notes (Signed)
? ?Subjective:  ? Joe Patton. is a 76 y.o. male who presents for Medicare Annual/Subsequent preventive examination. ? ?Virtual Visit via Telephone Note ? ?I connected with  Joe Patton on 02/15/22 at 11:30 AM EDT by telephone and verified that I am speaking with the correct person using two identifiers. ? ?Location: ?Patient: home ?Provider: [facility] ?Persons participating in the virtual visit: patient/Nurse Health Advisor ?  ?I discussed the limitations, risks, security and privacy concerns of performing an evaluation and management service by telephone and the availability of in person appointments. The patient expressed understanding and agreed to proceed. ? ?Interactive audio and video telecommunications were attempted between this nurse and patient, however failed, due to patient having technical difficulties OR patient did not have access to video capability.  We continued and completed visit with audio only. ? ?Some vital signs may be absent or patient reported.  ? ?Joe Marker, LPN ? ? ?Review of Systems    ? ?Cardiac Risk Factors include: advanced age (>41mn, >>32women);male gender;hypertension ? ?   ?Objective:  ?  ?Today's Vitals  ? 02/15/22 1142  ?PainSc: 3   ? ?There is no height or weight on file to calculate BMI. ? ? ?  02/15/2022  ? 11:48 AM 02/15/2021  ?  1:14 PM 02/10/2021  ?  1:52 PM 08/04/2020  ?  2:24 PM 09/13/2017  ? 11:48 AM 08/02/2015  ? 10:36 AM 08/25/2014  ? 10:23 AM  ?Advanced Directives  ?Does Patient Have a Medical Advance Directive? No Yes Yes Yes Yes No Yes  ?Type of Advance Directive  Living will Living will Healthcare [place] ?Does patient want to make changes to medical advance directive?  No - Patient declined   Yes (MAU/Ambulatory/Procedural Areas - Information given)  No - Patient declined  ?Copy of [place]?    No - copy requested No - copy requested  No -  copy requested  ?Would patient like information on creating a medical advance directive? No - Patient declined        ? ? ?Current Medications (verified) ?Outpatient Encounter Medications as of 02/15/2022  ?Medication Sig  ? amLODipine (NORVASC) 10 MG tablet Take 10 mg by mouth daily.  ? aspirin EC 81 MG tablet Take 1 tablet by mouth daily as needed.  ? cholecalciferol (VITAMIN D3) 25 MCG (1000 UNIT) tablet Take 1,000 Units by mouth daily.  ? citalopram (CELEXA) 20 MG tablet Take 1 tablet (20 mg total) by mouth daily.  ? labetalol (NORMODYNE) 100 MG tablet Take 100 mg by mouth daily.  ? losartan (COZAAR) 100 MG tablet Take 100 mg by mouth daily.  ? Multiple Vitamin (MULTI-VITAMINS) TABS Take 1 tablet by mouth daily.  ? potassium chloride (KLOR-CON M) 10 MEQ tablet Take 1 tablet (10 mEq total) by mouth daily.  ? sildenafil (REVATIO) 20 MG tablet TAKE 2-5 TABLETS 1 HOUR PRIOR TO INTERCOURSE  ? sodium chloride (OCEAN) 0.65 % SOLN nasal spray Place 1 spray into both nostrils as needed for congestion.  ? vitamin E 1000 UNIT capsule Take 1,000 Units by mouth daily.  ? [DISCONTINUED] mirabegron ER (MYRBETRIQ) 50 MG TB24 tablet Take 1 tablet (50 mg total) by mouth daily.  ? [DISCONTINUED] tamsulosin (FLOMAX) 0.4 MG CAPS capsule Take 0.4 mg by mouth daily.  ? ?No facility-administered encounter medications on file as of 02/15/2022.  ? ? ?Allergies (verified) ?Tramadol  and Latex  ? ?History: ?Past Medical History:  ?Diagnosis Date  ? Actinic keratosis   ? Allergic rhinitis   ? Anxiety   ? Arthritis   ? Bilirubinuria   ? BPH (benign prostatic hyperplasia)   ? Degeneration, intervertebral disc, lumbosacral   ? Erectile dysfunction   ? Eustachian tube dysfunction   ? Fibromyalgia   ? GERD (gastroesophageal reflux disease)   ? OTC if needed Pepcid  ? Heart murmur   ? Hypertension   ? Osteoarthrosis   ? Sinusitis   ? Sleep apnea   ? uses CPAP, will bring mask  ? Viral warts   ? ?Past Surgical History:  ?Procedure Laterality Date  ?  CATARACT EXTRACTION    ? right  ? CHOLECYSTECTOMY  2005  ? COLONOSCOPY  2014  ? cleared for 10 yrs- Dr Allen Norris  ? CYSTOSCOPY WITH LITHOLAPAXY N/A 02/15/2021  ? Procedure: CYSTOSCOPY WITH LITHOLAPAXY;  Surgeon: [name], MD;  Location: [facility];  Service: Urology;  Laterality: N/A;  ? EYE SURGERY Right   ? GALLBLADDER SURGERY    ? HIP SURGERY    ? left  ? JOINT REPLACEMENT    ? right and left total hips  ? TONSILLECTOMY    ? TOTAL SHOULDER ARTHROPLASTY Left 09/03/2014  ? Procedure: TOTAL SHOULDER ARTHROPLASTY;  Surgeon: Nita Sells, MD;  Location: [place];  Service: Orthopedics;  Laterality: Left;  ? ?Family History  ?Problem Relation Age of Onset  ? Breast cancer Mother   ? Hypertension Father   ? Heart attack Father   ? Stroke Father   ? ?Social History  ? ?Socioeconomic History  ? Marital status: Married  ?  Spouse name: Not on file  ? Number of children: 1  ? Years of education: Not on file  ? Highest education level: Not on file  ?Occupational History  ? Occupation: retired  ?Tobacco Use  ? Smoking status: Former  ?  Packs/day: 1.50  ?  Years: 20.00  ?  Pack years: 30.00  ?  Types: Cigarettes  ?  Quit date: 10/03/2007  ?  Years since quitting: 14.3  ? Smokeless tobacco: Never  ? Tobacco comments:  ?  Has not resumed smoking. No need to provide pt with smoking cessation materials  ?Vaping Use  ? Vaping Use: Never used  ?Substance and Sexual Activity  ? Alcohol use: No  ? Drug use: No  ? Sexual activity: Yes  ?Other Topics Concern  ? Not on file  ?Social History Narrative  ? Not on file  ? ?Social Determinants of Health  ? ?Financial Resource Strain: Low Risk   ? Difficulty of Paying Living Expenses: Not hard at all  ?Food Insecurity: No Food Insecurity  ? Worried About Charity fundraiser in the Last Year: Never true  ? Ran Out of Food in the Last Year: Never true  ?Transportation Needs: No Transportation Needs  ? Lack of Transportation (Medical): No  ? Lack of Transportation (Non-Medical): No   ?Physical Activity: Insufficiently Active  ? Days of Exercise per Week: 7 days  ? Minutes of Exercise per Session: 20 min  ?Stress: No Stress Concern Present  ? Feeling of Stress : Not at all  ?Social Connections: Socially Integrated  ? Frequency of Communication with Friends and Family: More than three times a week  ? Frequency of Social Gatherings with Friends and Family: More than three times a week  ? Attends Religious Services: More than 4 times  per year  ? Active Member of Clubs or Organizations: Yes  ? Attends Archivist Meetings: More than 4 times per year  ? Marital Status: Married  ? ? ?Tobacco Counseling ?Counseling given: Not Answered ?Tobacco comments: Has not resumed smoking. No need to provide pt with smoking cessation materials ? ? ?Clinical Intake: ? ?Pre-visit preparation completed: Yes ? ?Pain : 0-10 ?Pain Score: 3  ?Pain Type: Chronic pain ?Pain Location: Knee ?Pain Orientation: Right, Left ?Pain Descriptors / Indicators: Aching, Sore ?Pain Onset: More than a month ago ?Pain Frequency: Intermittent ? ?  ? ?Nutritional Risks: None ?Diabetes: No ? ?How often do you need to have someone help you when you read instructions, pamphlets, or other written materials from your doctor or pharmacy?: 1 - Never ? ? ? ?Interpreter Needed?: No ? ?Information entered by :: Joe Marker LPN ? ? ?Activities of Daily Living ? ?  02/15/2022  ? 11:49 AM  ?In your present state of health, do you have any difficulty performing the following activities:  ?Hearing? 0  ?Vision? 0  ?Difficulty concentrating or making decisions? 0  ?Walking or climbing stairs? 0  ?Dressing or bathing? 0  ?Doing errands, shopping? 0  ?Preparing Food and eating ? N  ?Using the Toilet? N  ?In the past six months, have you accidently leaked urine? N  ?Do you have problems with loss of bowel control? N  ?Managing your Medications? N  ?Managing your Finances? N  ?Housekeeping or managing your Housekeeping? N  ? ? ?Patient Care  Team: ?Juline Patch, MD as PCP - General (Family Medicine) ?Stoioff, Ronda Fairly, MD (Urology) ? ?Indicate any recent Medical Services you may have received from other than [facility] providers in the past year (date may be appr

## 2022-02-15 NOTE — Patient Instructions (Signed)
Mr. Ingwersen , ?Thank you for taking time to come for your Medicare Wellness Visit. I appreciate your ongoing commitment to your health goals. Please review the following plan we discussed and let me know if I can assist you in the future.  ? ?Screening recommendations/referrals: ?Colonoscopy: done 08/11/13. Repeat 08/2023 ?Recommended yearly ophthalmology/optometry visit for glaucoma screening and checkup ?Recommended yearly dental visit for hygiene and checkup ? ?Vaccinations: ?Influenza vaccine: done 06/27/21 ?Pneumococcal vaccine: done 09/13/17 ?Tdap vaccine: due ?Shingles vaccine: Shingrix discussed. Please contact your pharmacy for coverage information.  ?Covid-19: done 11/19/19, 12/17/19 & 08/16/20 ? ?Advanced directives: Please bring a copy of your health care power of attorney and living will to the office at your convenience.  ? ?Conditions/risks identified: Keep up the great work! ? ?Next appointment: Follow up in one year for your annual wellness visit.  ? ?Preventive Care 16 Years and Older, Male ?Preventive care refers to lifestyle choices and visits with your health care provider that can promote health and wellness. ?What does preventive care include? ?A yearly physical exam. This is also called an annual well check. ?Dental exams once or twice a year. ?Routine eye exams. Ask your health care provider how often you should have your eyes checked. ?Personal lifestyle choices, including: ?Daily care of your teeth and gums. ?Regular physical activity. ?Eating a healthy diet. ?Avoiding tobacco and drug use. ?Limiting alcohol use. ?Practicing safe sex. ?Taking low doses of aspirin every day. ?Taking vitamin and mineral supplements as recommended by your health care provider. ?What happens during an annual well check? ?The services and screenings done by your health care provider during your annual well check will depend on your age, overall health, lifestyle risk factors, and family history of  disease. ?Counseling  ?Your health care provider may ask you questions about your: ?Alcohol use. ?Tobacco use. ?Drug use. ?Emotional well-being. ?Home and relationship well-being. ?Sexual activity. ?Eating habits. ?History of falls. ?Memory and ability to understand (cognition). ?Work and work Statistician. ?Screening  ?You may have the following tests or measurements: ?Height, weight, and BMI. ?Blood pressure. ?Lipid and cholesterol levels. These may be checked every 5 years, or more frequently if you are over 16 years old. ?Skin check. ?Lung cancer screening. You may have this screening every year starting at age 48 if you have a 30-pack-year history of smoking and currently smoke or have quit within the past 15 years. ?Fecal occult blood test (FOBT) of the stool. You may have this test every year starting at age 40. ?Flexible sigmoidoscopy or colonoscopy. You may have a sigmoidoscopy every 5 years or a colonoscopy every 10 years starting at age 38. ?Prostate cancer screening. Recommendations will vary depending on your family history and other risks. ?Hepatitis C blood test. ?Hepatitis B blood test. ?Sexually transmitted disease (STD) testing. ?Diabetes screening. This is done by checking your blood sugar (glucose) after you have not eaten for a while (fasting). You may have this done every 1-3 years. ?Abdominal aortic aneurysm (AAA) screening. You may need this if you are a current or former smoker. ?Osteoporosis. You may be screened starting at age 75 if you are at high risk. ?Talk with your health care provider about your test results, treatment options, and if necessary, the need for more tests. ?Vaccines  ?Your health care provider may recommend certain vaccines, such as: ?Influenza vaccine. This is recommended every year. ?Tetanus, diphtheria, and acellular pertussis (Tdap, Td) vaccine. You may need a Td booster every 10 years. ?Zoster vaccine. You may  need this after age 67. ?Pneumococcal 13-valent  conjugate (PCV13) vaccine. One dose is recommended after age 74. ?Pneumococcal polysaccharide (PPSV23) vaccine. One dose is recommended after age 42. ?Talk to your health care provider about which screenings and vaccines you need and how often you need them. ?This information is not intended to replace advice given to you by your health care provider. Make sure you discuss any questions you have with your health care provider. ?Document Released: 10/15/2015 Document Revised: 06/07/2016 Document Reviewed: 07/20/2015 ?Elsevier Interactive Patient Education ? 2017 [place]. ? ?Fall Prevention in the Home ?Falls can cause injuries. They can happen to people of all ages. There are many things you can do to make your home safe and to help prevent falls. ?What can I do on the outside of my home? ?Regularly fix the edges of walkways and driveways and fix any cracks. ?Remove anything that might make you trip as you walk through a door, such as a raised step or threshold. ?Trim any bushes or trees on the path to your home. ?Use bright outdoor lighting. ?Clear any walking paths of anything that might make someone trip, such as rocks or tools. ?Regularly check to see if handrails are loose or broken. Make sure that both sides of any steps have handrails. ?Any raised decks and porches should have guardrails on the edges. ?Have any leaves, snow, or ice cleared regularly. ?Use sand or salt on walking paths during winter. ?Clean up any spills in your garage right away. This includes oil or grease spills. ?What can I do in the bathroom? ?Use night lights. ?Install grab bars by the toilet and in the tub and shower. Do not use towel bars as grab bars. ?Use non-skid mats or decals in the tub or shower. ?If you need to sit down in the shower, use a plastic, non-slip stool. ?Keep the floor dry. Clean up any water that spills on the floor as soon as it happens. ?Remove soap buildup in the tub or shower regularly. ?Attach bath mats  securely with double-sided non-slip rug tape. ?Do not have throw rugs and other things on the floor that can make you trip. ?What can I do in the bedroom? ?Use night lights. ?Make sure that you have a light by your bed that is easy to reach. ?Do not use any sheets or blankets that are too big for your bed. They should not hang down onto the floor. ?Have a firm chair that has side arms. You can use this for support while you get dressed. ?Do not have throw rugs and other things on the floor that can make you trip. ?What can I do in the kitchen? ?Clean up any spills right away. ?Avoid walking on wet floors. ?Keep items that you use a lot in easy-to-reach places. ?If you need to reach something above you, use a strong step stool that has a grab bar. ?Keep electrical cords out of the way. ?Do not use floor polish or wax that makes floors slippery. If you must use wax, use non-skid floor wax. ?Do not have throw rugs and other things on the floor that can make you trip. ?What can I do with my stairs? ?Do not leave any items on the stairs. ?Make sure that there are handrails on both sides of the stairs and use them. Fix handrails that are broken or loose. Make sure that handrails are as long as the stairways. ?Check any carpeting to make sure that it is firmly attached  to the stairs. Fix any carpet that is loose or worn. ?Avoid having throw rugs at the top or bottom of the stairs. If you do have throw rugs, attach them to the floor with carpet tape. ?Make sure that you have a light switch at the top of the stairs and the bottom of the stairs. If you do not have them, ask someone to add them for you. ?What else can I do to help prevent falls? ?Wear shoes that: ?Do not have high heels. ?Have rubber bottoms. ?Are comfortable and fit you well. ?Are closed at the toe. Do not wear sandals. ?If you use a stepladder: ?Make sure that it is fully opened. Do not climb a closed stepladder. ?Make sure that both sides of the stepladder  are locked into place. ?Ask someone to hold it for you, if possible. ?Clearly mark and make sure that you can see: ?Any grab bars or handrails. ?First and last steps. ?Where the edge of each step is. ?Use tools that help you mov

## 2022-03-01 DIAGNOSIS — L400 Psoriasis vulgaris: Secondary | ICD-10-CM | POA: Diagnosis not present

## 2022-03-11 ENCOUNTER — Other Ambulatory Visit: Payer: Self-pay | Admitting: Family Medicine

## 2022-03-11 DIAGNOSIS — F419 Anxiety disorder, unspecified: Secondary | ICD-10-CM

## 2022-03-11 DIAGNOSIS — F3342 Major depressive disorder, recurrent, in full remission: Secondary | ICD-10-CM

## 2022-03-13 NOTE — Telephone Encounter (Signed)
Requested Prescriptions  Pending Prescriptions Disp Refills  . citalopram (CELEXA) 20 MG tablet [Pharmacy Med Name: CITALOPRAM HBR 20 MG TABLET] 30 tablet 0    Sig: Take 1 tablet (20 mg total) by mouth daily.     Psychiatry:  Antidepressants - SSRI Passed - 03/11/2022  8:48 AM      Passed - Completed PHQ-2 or PHQ-9 in the last 360 days      Passed - Valid encounter within last 6 months    Recent Outpatient Visits          5 months ago Preop examination   [facility] Juline Patch, MD   10 months ago Recurrent major depressive disorder, in full remission ([place])   [facility] Juline Patch, MD   1 year ago Recurrent major depressive disorder, in full remission ([place])   [facility] Juline Patch, MD   1 year ago Recurrent major depressive disorder, in full remission [facility])   [facility] Juline Patch, MD   2 years ago Recurrent major depressive disorder, in full remission [facility])   [place], MD      Future Appointments            In 6 months [place], Ronda Fairly, MD [place]

## 2022-06-08 ENCOUNTER — Ambulatory Visit
Admission: RE | Admit: 2022-06-08 | Discharge: 2022-06-08 | Disposition: A | Discharge status: Home / Self Care | Payer: Medicare Other | Attending: Urology | Admitting: Urology

## 2022-06-08 ENCOUNTER — Ambulatory Visit
Admission: RE | Admit: 2022-06-08 | Discharge: 2022-06-08 | Disposition: A | Discharge status: Home / Self Care | Payer: Medicare Other | Source: Ambulatory Visit | Attending: Urology | Admitting: Urology

## 2022-06-08 ENCOUNTER — Telehealth: Payer: Self-pay | Admitting: *Deleted

## 2022-06-08 ENCOUNTER — Other Ambulatory Visit: Payer: Self-pay | Admitting: Family Medicine

## 2022-06-08 DIAGNOSIS — Z87442 Personal history of urinary calculi: Secondary | ICD-10-CM

## 2022-06-08 DIAGNOSIS — R109 Unspecified abdominal pain: Secondary | ICD-10-CM

## 2022-06-08 DIAGNOSIS — N210 Calculus in bladder: Secondary | ICD-10-CM

## 2022-06-08 DIAGNOSIS — F419 Anxiety disorder, unspecified: Secondary | ICD-10-CM

## 2022-06-08 DIAGNOSIS — F3342 Major depressive disorder, recurrent, in full remission: Secondary | ICD-10-CM

## 2022-06-08 NOTE — Telephone Encounter (Signed)
Patient called triage line reports right flank pain that has been increasing. He has a history of kidney stones. Per Dr. Bernardo Heater patient to get KUB done and will notify of results. Voiced understanding.

## 2022-06-09 NOTE — Telephone Encounter (Signed)
Requested Prescriptions  Pending Prescriptions Disp Refills  . citalopram (CELEXA) 20 MG tablet [Pharmacy Med Name: CITALOPRAM HBR 20 MG TABLET] 30 tablet 0    Sig: Take 1 tablet (20 mg total) by mouth daily.     Psychiatry:  Antidepressants - SSRI Passed - 06/08/2022  9:04 AM      Passed - Completed PHQ-2 or PHQ-9 in the last 360 days      Passed - Valid encounter within last 6 months    Recent Outpatient Visits          8 months ago Preop examination   [facility] [place], Deanna C, MD   1 year ago Recurrent major depressive disorder, in full remission [facility])   [place] [facility] [place], Deanna C, MD   1 year ago Recurrent major depressive disorder, in full remission [facility])   [place] [facility] [place], Deanna C, MD   2 years ago Recurrent major depressive disorder, in full remission [facility])   [place] [facility] [place], Deanna C, MD   2 years ago Recurrent major depressive disorder, in full remission [facility])   [place] [facility] [place], [place], MD      Future Appointments            In 3 months [place], Ronda Fairly, MD [place]

## 2022-06-12 ENCOUNTER — Telehealth: Payer: Self-pay | Admitting: *Deleted

## 2022-06-12 NOTE — Telephone Encounter (Signed)
-----   Message from [name], MD sent at 06/11/2022 12:27 PM EDT ----- KUB showed no evidence of a ureteral calculus.  If still having pain/symptoms will schedule stone protocol CT.

## 2022-06-12 NOTE — Telephone Encounter (Signed)
Notified patient as instructed, patient states his symptoms are much better. If he starts having pain or symptoms , we can order ct per patient

## 2022-06-16 ENCOUNTER — Other Ambulatory Visit: Payer: Self-pay | Admitting: *Deleted

## 2022-06-16 MED ORDER — SILDENAFIL CITRATE 20 MG PO TABS: ORAL_TABLET | ORAL | 2 refills | Status: DC

## 2022-06-28 ENCOUNTER — Telehealth: Payer: Self-pay

## 2022-06-28 NOTE — Patient Outreach (Signed)
  Care Coordination   06/28/2022 Name: [name]. MRN: [id number] DOB: 26-Feb-1946   Care Coordination Outreach Attempts:  An unsuccessful telephone outreach was attempted today to offer the patient information about available care coordination services as a benefit of their health plan.   Follow Up Plan:  Additional outreach attempts will be made to offer the patient care coordination information and services.   Encounter Outcome:  No Answer  Care Coordination Interventions Activated:  No   Care Coordination Interventions:  No, not indicated   Noreene Larsson RN, MSN, [facility]  Mobile: [phone]

## 2022-07-28 ENCOUNTER — Telehealth: Payer: Self-pay | Admitting: *Deleted

## 2022-07-28 NOTE — Patient Outreach (Signed)
  Care Coordination   07/28/2022 Name: [facility] Joe Patton. MRN: [id number] DOB: [birth year]   Care Coordination Outreach Attempts:  A second unsuccessful outreach was attempted today to offer the patient with information about available care coordination services as a benefit of their health plan.     Follow Up Plan:  Additional outreach attempts will be made to offer the patient care coordination information and services.   Encounter Outcome:  No Answer  Care Coordination Interventions Activated:  No   Care Coordination Interventions:  No, not indicated    Valente David, RN, MSN, [facility] Care Management Care Management Coordinator [phone]

## 2022-08-03 ENCOUNTER — Telehealth: Payer: Self-pay | Admitting: *Deleted

## 2022-08-03 NOTE — Patient Outreach (Signed)
  Care Coordination   08/03/2022 Name: [facility] Joe Patton. MRN: [id number] DOB: 1945/12/09   Care Coordination Outreach Attempts:  A third unsuccessful outreach was attempted today to offer the patient with information about available care coordination services as a benefit of their health plan.   Follow Up Plan:  No further outreach attempts will be made at this time. We have been unable to contact the patient to offer or enroll patient in care coordination services  Encounter Outcome:  No Answer  Care Coordination Interventions Activated:  No   Care Coordination Interventions:  No, not indicated    Valente David, RN, MSN, [facility] Care Management Care Management Coordinator [phone]

## 2022-08-08 ENCOUNTER — Ambulatory Visit (INDEPENDENT_AMBULATORY_CARE_PROVIDER_SITE_OTHER): Payer: Medicare Other | Admitting: Physician Assistant

## 2022-08-08 VITALS — BP 175/76 | HR 64 | Ht 76.0 in | Wt 220.0 lb

## 2022-08-08 DIAGNOSIS — R350 Frequency of micturition: Secondary | ICD-10-CM

## 2022-08-08 DIAGNOSIS — N401 Enlarged prostate with lower urinary tract symptoms: Secondary | ICD-10-CM

## 2022-08-08 DIAGNOSIS — N3941 Urge incontinence: Secondary | ICD-10-CM

## 2022-08-08 DIAGNOSIS — R3915 Urgency of urination: Secondary | ICD-10-CM | POA: Diagnosis not present

## 2022-08-08 LAB — MICROSCOPIC EXAMINATION

## 2022-08-08 LAB — URINALYSIS, COMPLETE
Bilirubin, UA: NEGATIVE
Glucose, UA: NEGATIVE
Ketones, UA: NEGATIVE
Leukocytes,UA: NEGATIVE
Nitrite, UA: NEGATIVE
RBC, UA: NEGATIVE
Specific Gravity, UA: 1.025 (ref 1.005–1.030)
Urobilinogen, Ur: 0.2 mg/dL (ref 0.2–1.0)
pH, UA: 5.5 (ref 5.0–7.5)

## 2022-08-08 MED ORDER — MIRABEGRON ER 25 MG PO TB24: 25.0000 mg | ORAL_TABLET | Freq: Every day | ORAL | 0 refills | Status: DC

## 2022-08-08 NOTE — Progress Notes (Signed)
08/08/2022 4:49 PM   Joe Patton [address] [place]. 02/04/1946 [id number]  CC: Chief Complaint  Patient presents with   Urinary Retention   HPI: Joe Patton. is a 76 y.o. male with PMH BPH with LUTS s/p PVP in 2014 with right lateral lobe regrowth on cystoscopy in 2021 previously on tolterodine and tamsulosin who presents today for evaluation of urinary urgency.   Today he reports chronic postvoid dribbling, daytime urgency every 90 to 180 minutes, and urge incontinence.  He has nocturia x1-2.  He denies any recent acute changes in his voiding symptoms, dysuria, or gross hematuria.  He was previously on Myrbetriq but stopped this due to cost.  He states he does not have medication coverage with Medicare so he has concerns about the cost of meds.  He stopped tamsulosin due to sexual side effects including retrograde ejaculation.  In-office UA today positive for 1+ protein; urine microscopy pan negative. PVR 56m.  PMH: Past Medical History:  Diagnosis Date   Actinic keratosis    Allergic rhinitis    Anxiety    Arthritis    Bilirubinuria    BPH (benign prostatic hyperplasia)    Degeneration, intervertebral disc, lumbosacral    Erectile dysfunction    Eustachian tube dysfunction    Fibromyalgia    GERD (gastroesophageal reflux disease)    OTC if needed Pepcid   Heart murmur    Hypertension    Osteoarthrosis    Sinusitis    Sleep apnea    uses CPAP, will bring mask   Viral warts     Surgical History: Past Surgical History:  Procedure Laterality Date   CATARACT EXTRACTION     right   CHOLECYSTECTOMY  2005   COLONOSCOPY  2014   cleared for 10 yrs- Dr WAllen Norris  CYSTOSCOPY WITH LITHOLAPAXY N/A 02/15/2021   Procedure: CYSTOSCOPY WITH LITHOLAPAXY;  Surgeon: SAbbie Sons MD;  Location: [facility];  Service: Urology;  Laterality: N/A;   EYE SURGERY Right    GALLBLADDER SURGERY     HIP SURGERY     left   JOINT REPLACEMENT     right and left total hips   TONSILLECTOMY      TOTAL SHOULDER ARTHROPLASTY Left 09/03/2014   Procedure: TOTAL SHOULDER ARTHROPLASTY;  Surgeon: JNita Sells MD;  Location[place] [facility]  Service: Orthopedics;  Laterality: Left;    Home Medications:  Allergies as of 08/08/2022       Reactions   Tramadol    Anger issues   Latex Rash        Medication List        Accurate as of August 08, 2022  4:49 PM. If you have any questions, ask your nurse or doctor.          amLODipine 10 MG tablet Commonly known as: NORVASC Take 10 mg by mouth daily.   aspirin EC 81 MG tablet Take 1 tablet by mouth daily as needed.   cholecalciferol 25 MCG (1000 UNIT) tablet Commonly known as: VITAMIN D3 Take 1,000 Units by mouth daily.   citalopram 20 MG tablet Commonly known as: CELEXA Take 1 tablet (20 mg total) by mouth daily.   labetalol 100 MG tablet Commonly known as: NORMODYNE Take 100 mg by mouth daily.   labetalol 100 MG tablet Commonly known as: NORMODYNE Take 1 tablet by mouth 2 (two) times daily.   losartan 100 MG tablet Commonly known as: COZAAR Take 100 mg by mouth daily.   Multi-Vitamins  Tabs Take 1 tablet by mouth daily.   potassium chloride 10 MEQ tablet Commonly known as: KLOR-CON M Take 1 tablet (10 mEq total) by mouth daily.   sildenafil 20 MG tablet Commonly known as: REVATIO TAKE 2-5 TABLETS 1 HOUR PRIOR TO INTERCOURSE   sodium chloride 0.65 % Soln nasal spray Commonly known as: OCEAN Place 1 spray into both nostrils as needed for congestion.   vitamin E 1000 UNIT capsule Take 1,000 Units by mouth daily.        Allergies:  Allergies  Allergen Reactions   Tramadol     Anger issues   Latex Rash    Family History: Family History  Problem Relation Age of Onset   Breast cancer Mother    Hypertension Father    Heart attack Father    Stroke Father     Social History:   reports that he quit smoking about 14 years ago. His smoking use included cigarettes. He has a 30.00 pack-year  smoking history. He has never used smokeless tobacco. He reports that he does not drink alcohol and does not use drugs.  Physical Exam: BP (!) 175/76   Pulse 64   Ht '6\' 4"'$  (1.93 m)   Wt 220 lb (99.8 kg)   BMI 26.78 kg/m   Constitutional:  Alert and oriented, no acute distress, nontoxic appearing HEENT: [place], AT Cardiovascular: No clubbing, cyanosis, or edema Respiratory: Normal respiratory effort, no increased work of breathing Skin: No rashes, bruises or suspicious lesions Neurologic: Grossly intact, no focal deficits, moving all 4 extremities Psychiatric: Normal mood and affect  Laboratory Data: Results for orders placed or performed in visit on 08/08/22  Microscopic Examination   Urine  Result Value Ref Range   WBC, UA 0-5 0 - 5 /hpf   RBC, Urine 0-2 0 - 2 /hpf   Epithelial Cells (non renal) 0-10 0 - 10 /hpf   Bacteria, UA Few None seen/Few  Urinalysis, Complete  Result Value Ref Range   Specific Gravity, UA 1.025 1.005 - 1.030   pH, UA 5.5 5.0 - 7.5   Color, UA Yellow Yellow   Appearance Ur Clear Clear   Leukocytes,UA Negative Negative   Protein,UA 1+ (A) Negative/Trace   Glucose, UA Negative Negative   Ketones, UA Negative Negative   RBC, UA Negative Negative   Bilirubin, UA Negative Negative   Urobilinogen, Ur 0.2 0.2 - 1.0 mg/dL   Nitrite, UA Negative Negative   Microscopic Examination See below:    Assessment & Plan:   1. Benign prostatic hyperplasia with urinary frequency Daytime urgency and urge incontinence without nocturia off OAB medications.  No obstructive voiding symptoms today, though he does admit to some postvoid dribbling.  He is emptying appropriately.  I offered him a trial of Myrbetriq samples today.  He was previously taking 50 mg, but unfortunately I only have 25 mg on hand at this point.  Okay to continue giving him samples as needed.  He is already scheduled for annual follow-up with Dr. Bernardo Heater next month, will plan for PVR at that visit. -  Urinalysis, Complete - Bladder Scan (Post Void Residual) in office - mirabegron ER (MYRBETRIQ) 25 MG TB24 tablet; Take 1 tablet (25 mg total) by mouth daily.  Dispense: 28 tablet; Refill: 0   Return in about 4 weeks (around 09/05/2022).  Debroah Loop, PA-C  [facility] [address], [place], [place] [address] [phone]

## 2022-08-10 ENCOUNTER — Other Ambulatory Visit: Payer: Self-pay | Admitting: Family Medicine

## 2022-08-10 DIAGNOSIS — M25562 Pain in left knee: Secondary | ICD-10-CM | POA: Diagnosis not present

## 2022-08-10 DIAGNOSIS — F3342 Major depressive disorder, recurrent, in full remission: Secondary | ICD-10-CM

## 2022-08-10 DIAGNOSIS — M25561 Pain in right knee: Secondary | ICD-10-CM | POA: Diagnosis not present

## 2022-08-10 DIAGNOSIS — M5441 Lumbago with sciatica, right side: Secondary | ICD-10-CM | POA: Diagnosis not present

## 2022-08-10 DIAGNOSIS — F419 Anxiety disorder, unspecified: Secondary | ICD-10-CM

## 2022-08-10 DIAGNOSIS — M5442 Lumbago with sciatica, left side: Secondary | ICD-10-CM | POA: Diagnosis not present

## 2022-08-10 DIAGNOSIS — M170 Bilateral primary osteoarthritis of knee: Secondary | ICD-10-CM | POA: Diagnosis not present

## 2022-08-10 NOTE — Telephone Encounter (Signed)
Too soon last RF 06/09/22 #90  Requested Prescriptions  Refused Prescriptions Disp Refills   citalopram (CELEXA) 20 MG tablet [Pharmacy Med Name: CITALOPRAM HBR 20 MG TABLET] 30 tablet 0    Sig: Take 1 tablet (20 mg total) by mouth daily.     Psychiatry:  Antidepressants - SSRI Passed - 08/10/2022  9:55 AM      Passed - Completed PHQ-2 or PHQ-9 in the last 360 days      Passed - Valid encounter within last 6 months    Recent Outpatient Visits           10 months ago Preop examination   [facility] [place], Deanna C, MD   1 year ago Recurrent major depressive disorder, in full remission [facility])   [facility] [place], Deanna C, MD   1 year ago Recurrent major depressive disorder, in full remission [facility])   [facility] [place], Deanna C, MD   2 years ago Recurrent major depressive disorder, in full remission [facility])   [facility] [place], Deanna C, MD   3 years ago Recurrent major depressive disorder, in full remission [facility])   [facility] [place], [place], MD       Future Appointments             In 1 month Stoioff, Ronda [place]ly, MD [place]

## 2022-08-17 DIAGNOSIS — L400 Psoriasis vulgaris: Secondary | ICD-10-CM | POA: Diagnosis not present

## 2022-08-17 DIAGNOSIS — L570 Actinic keratosis: Secondary | ICD-10-CM | POA: Diagnosis not present

## 2022-09-05 ENCOUNTER — Ambulatory Visit: Payer: 59 | Admitting: Physician Assistant

## 2022-09-06 ENCOUNTER — Other Ambulatory Visit: Payer: Self-pay | Admitting: Family Medicine

## 2022-09-06 DIAGNOSIS — F419 Anxiety disorder, unspecified: Secondary | ICD-10-CM

## 2022-09-06 DIAGNOSIS — F3342 Major depressive disorder, recurrent, in full remission: Secondary | ICD-10-CM

## 2022-09-12 ENCOUNTER — Encounter: Payer: Self-pay | Admitting: Family Medicine

## 2022-09-12 ENCOUNTER — Ambulatory Visit (INDEPENDENT_AMBULATORY_CARE_PROVIDER_SITE_OTHER): Payer: Medicare Other | Admitting: Family Medicine

## 2022-09-12 VITALS — BP 120/76 | HR 60 | Ht 76.0 in | Wt 230.0 lb

## 2022-09-12 DIAGNOSIS — F419 Anxiety disorder, unspecified: Secondary | ICD-10-CM

## 2022-09-12 DIAGNOSIS — F3342 Major depressive disorder, recurrent, in full remission: Secondary | ICD-10-CM

## 2022-09-12 DIAGNOSIS — E876 Hypokalemia: Secondary | ICD-10-CM | POA: Diagnosis not present

## 2022-09-12 MED ORDER — POTASSIUM CHLORIDE CRYS ER 10 MEQ PO TBCR: 10.0000 meq | EXTENDED_RELEASE_TABLET | Freq: Every day | ORAL | 1 refills | Status: DC

## 2022-09-12 MED ORDER — CITALOPRAM HYDROBROMIDE 20 MG PO TABS: 20.0000 mg | ORAL_TABLET | Freq: Every day | ORAL | 1 refills | Status: DC

## 2022-09-12 NOTE — Progress Notes (Signed)
Date:  09/12/2022   Name:  [facility] Joe Patton.   DOB:  09/17/1946   MRN:  [id number]   Chief Complaint: Depression, hypokalemia, and Flu Vaccine  Depression        This is a chronic problem.  The current episode started more than 1 year ago.   The onset quality is gradual.   The problem has been gradually improving since onset.  Associated symptoms include no decreased concentration, no fatigue, no helplessness, no hopelessness, does not have insomnia, not irritable, no restlessness, no decreased interest, no appetite change, no body aches, no myalgias, no headaches, no indigestion, not sad and no suicidal ideas.     The symptoms are aggravated by nothing.  Past treatments include SSRIs - Selective serotonin reuptake inhibitors.  Compliance with treatment is good.  Previous treatment provided moderate relief.   Lab Results  Component Value Date   NA 142 10/04/2021   K 3.4 (L) 10/04/2021   CO2 26 10/04/2021   GLUCOSE 88 10/04/2021   BUN 11 10/04/2021   CREATININE 1.19 10/04/2021   CALCIUM 8.9 10/04/2021   EGFR 64 10/04/2021   GFRNONAA >60 02/11/2021   No results found for: "CHOL", "HDL", "LDLCALC", "LDLDIRECT", "TRIG", "CHOLHDL" No results found for: "TSH" Lab Results  Component Value Date   HGBA1C 5.1 10/04/2021   Lab Results  Component Value Date   WBC 7.5 10/04/2021   HGB 14.4 10/04/2021   HCT 42.6 10/04/2021   MCV 89 10/04/2021   PLT 246 10/04/2021   Lab Results  Component Value Date   ALT 16 10/04/2021   AST 20 10/04/2021   ALKPHOS 83 10/04/2021   BILITOT 0.6 10/04/2021   No results found for: "25OHVITD2", "25OHVITD3", "VD25OH"   Review of Systems  Constitutional:  Negative for appetite change and fatigue.  HENT:  Negative for congestion, nosebleeds, postnasal drip, rhinorrhea, sinus pressure, sinus pain and sneezing.   Respiratory:  Negative for chest tightness and shortness of breath.   Cardiovascular:  Negative for chest pain, palpitations and leg  swelling.  Gastrointestinal:  Negative for abdominal pain.  Endocrine: Negative for polydipsia and polyuria.  Musculoskeletal:  Negative for myalgias.  Neurological:  Negative for headaches.  Psychiatric/Behavioral:  Positive for depression. Negative for decreased concentration and suicidal ideas. The patient does not have insomnia.     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   Basal cell papilloma 12/11/2018   Bilirubin in urine 12/11/2018   Chronic venous insufficiency 12/11/2018   Coxitis 12/11/2018   DDD (degenerative disc disease), lumbosacral 12/11/2018   Dermatitis, stasis 12/11/2018   Dysfunction of eustachian tube 12/11/2018   Flu vaccine need 12/11/2018   Heart murmur 12/11/2018   Hypertension 12/11/2018   Impingement syndrome of shoulder 12/11/2018   Insect bite, nonvenomous 12/11/2018   Need for vaccination 12/11/2018   Preop examination 12/11/2018   Sinus infection 12/11/2018   Recurrent major depressive disorder, in full remission ([place]) 05/21/2018   Benign prostatic hyperplasia with lower urinary tract symptoms 12/05/2017   Erectile dysfunction 12/05/2017   Condyloma acuminatum 09/04/2017   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   Presence of artificial shoulder joint 09/03/2014   Osteoarthritis of both knees 07/07/2014   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   OSA (obstructive sleep apnea) 05/08/2014   Abnormal EKG 05/06/2014   Depression 05/06/2014   Obesity 05/06/2014   Urinary urgency  07/22/2012    Allergies  Allergen Reactions   Tramadol     Anger issues   Latex Rash    Past Surgical History:  Procedure Laterality Date   CATARACT EXTRACTION     right   CHOLECYSTECTOMY  2005   COLONOSCOPY  2014   cleared for 10 yrs- Dr Allen Norris   CYSTOSCOPY WITH LITHOLAPAXY N/A 02/15/2021   Procedure: CYSTOSCOPY WITH LITHOLAPAXY;  Surgeon:  [name], MD;  Location: [facility];  Service: Urology;  Laterality: N/A;   EYE SURGERY Right    GALLBLADDER SURGERY     HIP SURGERY     left   JOINT REPLACEMENT     right and left total hips   TONSILLECTOMY     TOTAL SHOULDER ARTHROPLASTY Left 09/03/2014   Procedure: TOTAL SHOULDER ARTHROPLASTY;  Surgeon: Nita Sells, MD;  Location: [facility];  Service: Orthopedics;  Laterality: Left;    Social History   Tobacco Use   Smoking status: Former    Packs/day: 1.50    Years: 20.00    Total pack years: 30.00    Types: Cigarettes    Quit date: 10/03/2007    Years since quitting: 14.9   Smokeless tobacco: Never   Tobacco comments:    Has not resumed smoking. No need to provide pt with smoking cessation materials  Vaping Use   Vaping Use: Never used  Substance Use Topics   Alcohol use: No   Drug use: No     Medication list has been reviewed and updated.  Current Meds  Medication Sig   amLODipine (NORVASC) 10 MG tablet Take 10 mg by mouth daily.   cholecalciferol (VITAMIN D3) 25 MCG (1000 UNIT) tablet Take 1,000 Units by mouth daily.   citalopram (CELEXA) 20 MG tablet Take 1 tablet (20 mg total) by mouth daily.   labetalol (NORMODYNE) 100 MG tablet Take 100 mg by mouth daily.   losartan (COZAAR) 100 MG tablet Take 100 mg by mouth daily.   mirabegron ER (MYRBETRIQ) 25 MG TB24 tablet Take 1 tablet (25 mg total) by mouth daily.   Multiple Vitamin (MULTI-VITAMINS) TABS Take 1 tablet by mouth daily.   potassium chloride (KLOR-CON M) 10 MEQ tablet Take 1 tablet (10 mEq total) by mouth daily.   sildenafil (REVATIO) 20 MG tablet TAKE 2-5 TABLETS 1 HOUR PRIOR TO INTERCOURSE   sodium chloride (OCEAN) 0.65 % SOLN nasal spray Place 1 spray into both nostrils as needed for congestion.   vitamin E 1000 UNIT capsule Take 1,000 Units by mouth daily.   [DISCONTINUED] aspirin EC 81 MG tablet Take 1 tablet by mouth daily as needed.       09/12/2022    3:04 PM 10/04/2021   11:17 AM  04/26/2021   10:38 AM 11/12/2020   10:32 AM  GAD 7 : Generalized Anxiety Score  Nervous, Anxious, on Edge 0 0 0 0  Control/stop worrying 0 0 0 0  Worry too much - different things 0 0 0 0  Trouble relaxing 0 0 0 0  Restless 0 0 0 0  Easily annoyed or irritable 0 0 0 0  Afraid - awful might happen 0 0 0 0  Total GAD 7 Score 0 0 0 0  Anxiety Difficulty Not difficult at all Not difficult at all         09/12/2022    3:04 PM 02/15/2022   11:47 AM 10/04/2021   11:16 AM  Depression screen PHQ 2/9  Decreased Interest 0  0 0  Down, Depressed, Hopeless 0 0 0  PHQ - 2 Score 0 0 0  Altered sleeping 0  0  Tired, decreased energy 0  0  Change in appetite 0  0  Feeling bad or failure about yourself  0  0  Trouble concentrating 0  0  Moving slowly or fidgety/restless 0  0  Suicidal thoughts 0  0  PHQ-9 Score 0  0  Difficult doing work/chores Not difficult at all  Not difficult at all    BP Readings from Last 3 Encounters:  09/12/22 120/76  08/08/22 (!) 175/76  10/04/21 120/80    Physical Exam Vitals and nursing note reviewed.  Constitutional:      General: He is not irritable. HENT:     Head: Normocephalic.     Right Ear: External ear normal.     Left Ear: External ear normal.     Nose: Nose normal.  Eyes:     General: No scleral icterus.       Right eye: No discharge.        Left eye: No discharge.     Conjunctiva/sclera: Conjunctivae normal.     Pupils: Pupils are equal, round, and reactive to light.  Neck:     Thyroid: No thyromegaly.     Vascular: No JVD.     Trachea: No tracheal deviation.  Cardiovascular:     Rate and Rhythm: Normal rate and regular rhythm.     Heart sounds: Normal heart sounds. No murmur heard.    No friction rub. No gallop.  Pulmonary:     Effort: No respiratory distress.     Breath sounds: Normal breath sounds. No wheezing, rhonchi or rales.  Abdominal:     General: Bowel sounds are normal.     Palpations: Abdomen is soft. There is no mass.      Tenderness: There is no abdominal tenderness. There is no guarding or rebound.  Musculoskeletal:        General: No tenderness. Normal range of motion.     Cervical back: Normal range of motion and neck supple.  Lymphadenopathy:     Cervical: No cervical adenopathy.  Skin:    General: Skin is warm.     Findings: No rash.  Neurological:     Cranial Nerves: No cranial nerve deficit.     Deep Tendon Reflexes: Reflexes are normal and symmetric.     Wt Readings from Last 3 Encounters:  09/12/22 230 lb (104.3 kg)  08/08/22 220 lb (99.8 kg)  10/04/21 236 lb (107 kg)    BP 120/76   Pulse 60   Ht _0  (1.93 m)   Wt 230 lb (104.3 kg)   SpO2 96%   BMI 28.00 kg/m   Assessment and Plan:  1. Recurrent major depressive disorder, in full remission ([place]) Chronic.  Persistent.  Stable.  PHQ is 0 GAD score is 0 continue Celexa 20 mg once a day.  Will recheck in 6 months - citalopram (CELEXA) 20 MG tablet; Take 1 tablet (20 mg total) by mouth daily.  Dispense: 90 tablet; Refill: 1  2. Anxiety Chronic.  Controlled.  Stable.  GAD score is 0.  Continue citalopram 20 mg once a day will recheck in 6 months - citalopram (CELEXA) 20 MG tablet; Take 1 tablet (20 mg total) by mouth daily.  Dispense: 90 tablet; Refill: 1  3. Hypokalemia Chronic.  Compensated with supplement of 20 mEq KCl daily.  Will recheck electrolytes next visit. -  potassium chloride (KLOR-CON M) 10 MEQ tablet; Take 1 tablet (10 mEq total) by mouth daily.  Dispense: 90 tablet; Refill: 1    Otilio Miu, MD

## 2022-09-13 ENCOUNTER — Ambulatory Visit (INDEPENDENT_AMBULATORY_CARE_PROVIDER_SITE_OTHER): Payer: Medicare Other | Admitting: Urology

## 2022-09-13 ENCOUNTER — Encounter: Payer: Self-pay | Admitting: Urology

## 2022-09-13 ENCOUNTER — Ambulatory Visit
Admission: RE | Admit: 2022-09-13 | Discharge: 2022-09-13 | Disposition: A | Discharge status: Home / Self Care | Payer: Medicare Other | Source: Ambulatory Visit | Attending: Urology | Admitting: Urology

## 2022-09-13 VITALS — BP 163/80 | HR 59 | Ht 74.0 in | Wt 220.0 lb

## 2022-09-13 DIAGNOSIS — R399 Unspecified symptoms and signs involving the genitourinary system: Secondary | ICD-10-CM | POA: Diagnosis not present

## 2022-09-13 DIAGNOSIS — Z87442 Personal history of urinary calculi: Secondary | ICD-10-CM

## 2022-09-13 DIAGNOSIS — N200 Calculus of kidney: Secondary | ICD-10-CM | POA: Diagnosis not present

## 2022-09-13 DIAGNOSIS — N5201 Erectile dysfunction due to arterial insufficiency: Secondary | ICD-10-CM | POA: Diagnosis not present

## 2022-09-13 DIAGNOSIS — N3281 Overactive bladder: Secondary | ICD-10-CM

## 2022-09-13 DIAGNOSIS — M419 Scoliosis, unspecified: Secondary | ICD-10-CM | POA: Diagnosis not present

## 2022-09-13 DIAGNOSIS — N2889 Other specified disorders of kidney and ureter: Secondary | ICD-10-CM | POA: Diagnosis not present

## 2022-09-13 DIAGNOSIS — M47816 Spondylosis without myelopathy or radiculopathy, lumbar region: Secondary | ICD-10-CM | POA: Diagnosis not present

## 2022-09-13 DIAGNOSIS — N3941 Urge incontinence: Secondary | ICD-10-CM

## 2022-09-13 LAB — BLADDER SCAN AMB NON-IMAGING: Scan Result: 61

## 2022-09-13 NOTE — Progress Notes (Unsigned)
09/13/2022 1:10 PM   [address] Joe Patton. 1945/10/03 [id number]  Referring provider: Juline Patch, MD [address] [place],  [place] [address]  Chief Complaint  Patient presents with   Nephrolithiasis    Urologic history: 1.  BPH with lower urinary tract symptoms -PVP January 2014 -Storage related voiding symptoms -Cystoscopy 12/2019 with right lateral lobe regrowth -TRUS volume 24 cc -On Myrbetriq    2.  Erectile dysfunction -sildenafil  3.  Prostatic calculus -Irritative voiding symptoms 12/2020 -Found to have a 3 cm prostatic calculus  -Cystolitholapaxy 01/2021  4.  Nephrolithiasis - Bilateral, nonobstructing renal calculi  HPI: 76 y.o. male presents for follow-up.  Saw Thomas Hoff 08/08/2022 with worsening storage related voiding symptoms He had stopped Myrbetriq due to cost.  Urinalysis was under markable end PVR 0 mL He was given Myrbetriq 25 mg samples and has seen some improvement though not as effective as the 50 mg (no 50 mg samples at the time of last visit)   PMH: Past Medical History:  Diagnosis Date   Actinic keratosis    Allergic rhinitis    Anxiety    Arthritis    Bilirubinuria    BPH (benign prostatic hyperplasia)    Degeneration, intervertebral disc, lumbosacral    Erectile dysfunction    Eustachian tube dysfunction    Fibromyalgia    GERD (gastroesophageal reflux disease)    OTC if needed Pepcid   Heart murmur    Hypertension    Osteoarthrosis    Sinusitis    Sleep apnea    uses CPAP, will bring mask   Viral warts     Surgical History: Past Surgical History:  Procedure Laterality Date   CATARACT EXTRACTION     right   CHOLECYSTECTOMY  2005   COLONOSCOPY  2014   cleared for 10 yrs- Dr Allen Norris   CYSTOSCOPY WITH LITHOLAPAXY N/A 02/15/2021   Procedure: CYSTOSCOPY WITH LITHOLAPAXY;  Surgeon: [name], MD;  Location: [facility];  Service: Urology;  Laterality: N/A;   EYE SURGERY Right    GALLBLADDER SURGERY      HIP SURGERY     left   JOINT REPLACEMENT     right and left total hips   TONSILLECTOMY     TOTAL SHOULDER ARTHROPLASTY Left 09/03/2014   Procedure: TOTAL SHOULDER ARTHROPLASTY;  Surgeon: Nita Sells, MD;  Location: [place];  Service: Orthopedics;  Laterality: Left;    Home Medications:  Allergies as of 09/13/2022       Reactions   Tramadol    Anger issues   Latex Rash        Medication List        Accurate as of September 13, 2022  1:10 PM. If you have any questions, ask your nurse or doctor.          amLODipine 10 MG tablet Commonly known as: NORVASC Take 10 mg by mouth daily.   cholecalciferol 25 MCG (1000 UNIT) tablet Commonly known as: VITAMIN D3 Take 1,000 Units by mouth daily.   citalopram 20 MG tablet Commonly known as: CELEXA Take 1 tablet (20 mg total) by mouth daily.   labetalol 100 MG tablet Commonly known as: NORMODYNE Take 100 mg by mouth daily.   labetalol 100 MG tablet Commonly known as: NORMODYNE Take 1 tablet by mouth 2 (two) times daily.   losartan 100 MG tablet Commonly known as: COZAAR Take 100 mg by mouth daily.   mirabegron ER 25 MG Tb24  tablet Commonly known as: MYRBETRIQ Take 1 tablet (25 mg total) by mouth daily.   Multi-Vitamins Tabs Take 1 tablet by mouth daily.   potassium chloride 10 MEQ tablet Commonly known as: KLOR-CON M Take 1 tablet (10 mEq total) by mouth daily.   sildenafil 20 MG tablet Commonly known as: REVATIO TAKE 2-5 TABLETS 1 HOUR PRIOR TO INTERCOURSE   sodium chloride 0.65 % Soln nasal spray Commonly known as: OCEAN Place 1 spray into both nostrils as needed for congestion.   vitamin E 1000 UNIT capsule Take 1,000 Units by mouth daily.        Allergies:  Allergies  Allergen Reactions   Tramadol     Anger issues   Latex Rash    Family History: Family History  Problem Relation Age of Onset   Breast cancer Mother    Hypertension Father    Heart attack Father    Stroke  Father     Social History:  reports that he quit smoking about 14 years ago. His smoking use included cigarettes. He has a 30.00 pack-year smoking history. He has never used smokeless tobacco. He reports that he does not drink alcohol and does not use drugs.   Physical Exam: BP (!) 163/80   Pulse (!) 59   Ht '6\' 2"'$  (1.88 m)   Wt 220 lb (99.8 kg)   BMI 28.25 kg/m   Constitutional:  Alert and oriented, No acute distress. HEENT: [place] AT Respiratory: Normal respiratory effort, no increased work of breathing. Psychiatric: Normal mood and affect.   Assessment & Plan:    1.  Lower urinary tract symptoms Given Myrbetriq 50 mg samples  2.  Erectile dysfunction Stable  3.  Bilateral nephrolithiasis KUB today was stable, bilateral renal calculi No pelvic calcifications identified  Continue annual follow-up   [name], MD  [place] [employer] [address] [place], [place] [address] [phone]

## 2022-10-04 DIAGNOSIS — M48061 Spinal stenosis, lumbar region without neurogenic claudication: Secondary | ICD-10-CM | POA: Diagnosis not present

## 2022-10-09 DIAGNOSIS — Z6832 Body mass index (BMI) 32.0-32.9, adult: Secondary | ICD-10-CM | POA: Diagnosis not present

## 2022-10-09 DIAGNOSIS — E669 Obesity, unspecified: Secondary | ICD-10-CM | POA: Diagnosis not present

## 2022-10-09 DIAGNOSIS — R011 Cardiac murmur, unspecified: Secondary | ICD-10-CM | POA: Diagnosis not present

## 2022-10-09 DIAGNOSIS — R9431 Abnormal electrocardiogram [ECG] [EKG]: Secondary | ICD-10-CM | POA: Diagnosis not present

## 2022-10-09 DIAGNOSIS — G4733 Obstructive sleep apnea (adult) (pediatric): Secondary | ICD-10-CM | POA: Diagnosis not present

## 2022-10-09 DIAGNOSIS — I10 Essential (primary) hypertension: Secondary | ICD-10-CM | POA: Diagnosis not present

## 2022-10-10 DIAGNOSIS — M48061 Spinal stenosis, lumbar region without neurogenic claudication: Secondary | ICD-10-CM | POA: Diagnosis not present

## 2022-10-13 DIAGNOSIS — M48061 Spinal stenosis, lumbar region without neurogenic claudication: Secondary | ICD-10-CM | POA: Diagnosis not present

## 2022-10-16 ENCOUNTER — Other Ambulatory Visit: Payer: Self-pay | Admitting: *Deleted

## 2022-10-16 DIAGNOSIS — N401 Enlarged prostate with lower urinary tract symptoms: Secondary | ICD-10-CM

## 2022-10-16 MED ORDER — MIRABEGRON ER 50 MG PO TB24: 50.0000 mg | ORAL_TABLET | Freq: Every day | ORAL | 0 refills | Status: DC

## 2022-10-16 NOTE — Telephone Encounter (Signed)
Sample of myrbetriq 50 was giving to patient per note

## 2022-10-17 DIAGNOSIS — M48061 Spinal stenosis, lumbar region without neurogenic claudication: Secondary | ICD-10-CM | POA: Diagnosis not present

## 2022-10-20 DIAGNOSIS — M48061 Spinal stenosis, lumbar region without neurogenic claudication: Secondary | ICD-10-CM | POA: Diagnosis not present

## 2022-10-24 DIAGNOSIS — M48061 Spinal stenosis, lumbar region without neurogenic claudication: Secondary | ICD-10-CM | POA: Diagnosis not present

## 2022-10-27 DIAGNOSIS — M48061 Spinal stenosis, lumbar region without neurogenic claudication: Secondary | ICD-10-CM | POA: Diagnosis not present

## 2022-10-31 DIAGNOSIS — M48061 Spinal stenosis, lumbar region without neurogenic claudication: Secondary | ICD-10-CM | POA: Diagnosis not present

## 2022-11-03 DIAGNOSIS — M48061 Spinal stenosis, lumbar region without neurogenic claudication: Secondary | ICD-10-CM | POA: Diagnosis not present

## 2022-11-08 DIAGNOSIS — M48061 Spinal stenosis, lumbar region without neurogenic claudication: Secondary | ICD-10-CM | POA: Diagnosis not present

## 2022-11-10 DIAGNOSIS — M48061 Spinal stenosis, lumbar region without neurogenic claudication: Secondary | ICD-10-CM | POA: Diagnosis not present

## 2022-11-13 DIAGNOSIS — M48061 Spinal stenosis, lumbar region without neurogenic claudication: Secondary | ICD-10-CM | POA: Diagnosis not present

## 2022-11-23 DIAGNOSIS — M48061 Spinal stenosis, lumbar region without neurogenic claudication: Secondary | ICD-10-CM | POA: Diagnosis not present

## 2022-11-27 DIAGNOSIS — M48061 Spinal stenosis, lumbar region without neurogenic claudication: Secondary | ICD-10-CM | POA: Diagnosis not present

## 2022-11-30 DIAGNOSIS — M48061 Spinal stenosis, lumbar region without neurogenic claudication: Secondary | ICD-10-CM | POA: Diagnosis not present

## 2022-12-06 ENCOUNTER — Other Ambulatory Visit: Payer: Self-pay | Admitting: Family Medicine

## 2022-12-06 DIAGNOSIS — F3342 Major depressive disorder, recurrent, in full remission: Secondary | ICD-10-CM

## 2022-12-06 DIAGNOSIS — F419 Anxiety disorder, unspecified: Secondary | ICD-10-CM

## 2022-12-07 DIAGNOSIS — M48061 Spinal stenosis, lumbar region without neurogenic claudication: Secondary | ICD-10-CM | POA: Diagnosis not present

## 2022-12-07 NOTE — Telephone Encounter (Signed)
Refused the Celexa 20 mg tablets because it's being requested too soon.

## 2022-12-13 ENCOUNTER — Telehealth: Payer: Self-pay

## 2022-12-13 DIAGNOSIS — M48061 Spinal stenosis, lumbar region without neurogenic claudication: Secondary | ICD-10-CM | POA: Diagnosis not present

## 2022-12-13 NOTE — Telephone Encounter (Signed)
Pt LM on triage line with question/concern about Myrbetriq.

## 2022-12-13 NOTE — Telephone Encounter (Signed)
Mybetriq sample left up front

## 2022-12-19 DIAGNOSIS — A630 Anogenital (venereal) warts: Secondary | ICD-10-CM | POA: Diagnosis not present

## 2022-12-19 DIAGNOSIS — M48061 Spinal stenosis, lumbar region without neurogenic claudication: Secondary | ICD-10-CM | POA: Diagnosis not present

## 2022-12-19 DIAGNOSIS — L400 Psoriasis vulgaris: Secondary | ICD-10-CM | POA: Diagnosis not present

## 2022-12-19 DIAGNOSIS — L280 Lichen simplex chronicus: Secondary | ICD-10-CM | POA: Diagnosis not present

## 2022-12-21 DIAGNOSIS — M48061 Spinal stenosis, lumbar region without neurogenic claudication: Secondary | ICD-10-CM | POA: Diagnosis not present

## 2022-12-26 DIAGNOSIS — M48061 Spinal stenosis, lumbar region without neurogenic claudication: Secondary | ICD-10-CM | POA: Diagnosis not present

## 2022-12-28 DIAGNOSIS — M48061 Spinal stenosis, lumbar region without neurogenic claudication: Secondary | ICD-10-CM | POA: Diagnosis not present

## 2023-01-02 DIAGNOSIS — M48061 Spinal stenosis, lumbar region without neurogenic claudication: Secondary | ICD-10-CM | POA: Diagnosis not present

## 2023-01-05 DIAGNOSIS — M48061 Spinal stenosis, lumbar region without neurogenic claudication: Secondary | ICD-10-CM | POA: Diagnosis not present

## 2023-01-08 DIAGNOSIS — M48061 Spinal stenosis, lumbar region without neurogenic claudication: Secondary | ICD-10-CM | POA: Diagnosis not present

## 2023-01-11 DIAGNOSIS — M48061 Spinal stenosis, lumbar region without neurogenic claudication: Secondary | ICD-10-CM | POA: Diagnosis not present

## 2023-01-15 DIAGNOSIS — M48061 Spinal stenosis, lumbar region without neurogenic claudication: Secondary | ICD-10-CM | POA: Diagnosis not present

## 2023-01-17 DIAGNOSIS — M48061 Spinal stenosis, lumbar region without neurogenic claudication: Secondary | ICD-10-CM | POA: Diagnosis not present

## 2023-01-22 DIAGNOSIS — M48061 Spinal stenosis, lumbar region without neurogenic claudication: Secondary | ICD-10-CM | POA: Diagnosis not present

## 2023-01-26 ENCOUNTER — Telehealth: Payer: Self-pay | Admitting: Family Medicine

## 2023-01-26 NOTE — Telephone Encounter (Signed)
Contacted [facility]. to schedule their annual wellness visit. Call back at later date: 01/29/2023  Verlee Rossetti; Care Guide Ambulatory Clinical Support [place] [facility] Direct Dial: [phone]

## 2023-02-01 DIAGNOSIS — M48061 Spinal stenosis, lumbar region without neurogenic claudication: Secondary | ICD-10-CM | POA: Diagnosis not present

## 2023-02-08 DIAGNOSIS — M48061 Spinal stenosis, lumbar region without neurogenic claudication: Secondary | ICD-10-CM | POA: Diagnosis not present

## 2023-02-15 DIAGNOSIS — M48061 Spinal stenosis, lumbar region without neurogenic claudication: Secondary | ICD-10-CM | POA: Diagnosis not present

## 2023-02-22 DIAGNOSIS — M48061 Spinal stenosis, lumbar region without neurogenic claudication: Secondary | ICD-10-CM | POA: Diagnosis not present

## 2023-03-06 ENCOUNTER — Telehealth: Payer: Self-pay

## 2023-03-06 NOTE — Telephone Encounter (Signed)
Patient returned my call and information provided regarding Mybetriq at [facility] for 70.00/month.  Patient would like to check with his pharmacy [facility] in [place] and see about their pricing.  Patient will call back to let office know which pharmacy to send medication.

## 2023-03-06 NOTE — Telephone Encounter (Signed)
LM on pt's voicemail for him to call back and ask for nurse.  Spoke with Hilton Sinclair, PA-C regarding cost of Mybetriq.  She stated pt can get generic RX sent to [facility] for 70.00 a month.

## 2023-03-06 NOTE — Telephone Encounter (Signed)
Pt called in on triage line requesting Mybetriq samples.  States he is unable to afford to pay for the medication due to high cost.

## 2023-03-07 MED ORDER — MIRABEGRON ER 50 MG PO TB24: 50.0000 mg | ORAL_TABLET | Freq: Every day | ORAL | 11 refills | Status: DC

## 2023-03-07 NOTE — Telephone Encounter (Signed)
Patient returned call and wants Myrbetriq sent to [facility]. RX sent

## 2023-03-08 ENCOUNTER — Telehealth: Payer: Self-pay

## 2023-03-08 NOTE — Telephone Encounter (Signed)
Pt called in on triage line.  States [facility] pharmacy has been out of generic Mybetriq but will get medication this afternoon.  He will pick up today.

## 2023-03-16 ENCOUNTER — Ambulatory Visit: Payer: 59 | Admitting: Family Medicine

## 2023-04-19 ENCOUNTER — Telehealth: Payer: Self-pay | Admitting: Family Medicine

## 2023-04-19 NOTE — Telephone Encounter (Signed)
Copied from CRM [id number]. Topic: Medicare AWV >> Apr 19, 2023  1:44 PM Payton Doughty wrote: Reason for CRM: LM 04/19/2023 to schedule AWV   Verlee Rossetti; Care Guide Ambulatory Clinical Support [place] [facility] Direct Dial: [phone]

## 2023-05-23 DIAGNOSIS — L570 Actinic keratosis: Secondary | ICD-10-CM | POA: Diagnosis not present

## 2023-05-23 DIAGNOSIS — L400 Psoriasis vulgaris: Secondary | ICD-10-CM | POA: Diagnosis not present

## 2023-05-23 DIAGNOSIS — L821 Other seborrheic keratosis: Secondary | ICD-10-CM | POA: Diagnosis not present

## 2023-05-23 DIAGNOSIS — L291 Pruritus scroti: Secondary | ICD-10-CM | POA: Diagnosis not present

## 2023-05-31 ENCOUNTER — Telehealth: Payer: Self-pay | Admitting: Family Medicine

## 2023-05-31 NOTE — Telephone Encounter (Signed)
Copied from CRM [id number]. Topic: Medicare AWV >> May 31, 2023 10:49 AM Payton Doughty wrote: Reason for CRM: LM 05/31/2023 to schedule AWV   Verlee Rossetti; Care Guide Ambulatory Clinical Support [place] [facility] Direct Dial: [phone]

## 2023-06-05 ENCOUNTER — Other Ambulatory Visit: Payer: Self-pay | Admitting: Family Medicine

## 2023-06-05 DIAGNOSIS — F419 Anxiety disorder, unspecified: Secondary | ICD-10-CM

## 2023-06-05 DIAGNOSIS — F3342 Major depressive disorder, recurrent, in full remission: Secondary | ICD-10-CM

## 2023-06-06 ENCOUNTER — Telehealth: Payer: Self-pay | Admitting: Family Medicine

## 2023-06-06 NOTE — Telephone Encounter (Signed)
Copied from CRM [id number]. Topic: General - Inquiry >> Jun 06, 2023  9:45 AM De Blanch wrote: Reason for CRM: Pt called in and stated he was advised Dr. Yetta Barre wants to see him and that he needs fasting labs. Stated he had a death in the family and is going through a lot asked if he could come in and do labs only.  First available with Dr. Yetta Barre is 09/20. Mentioned he is in the area right now and would like to come by and take care of this.  Please advise.

## 2023-06-07 ENCOUNTER — Telehealth: Payer: Self-pay

## 2023-06-07 NOTE — Telephone Encounter (Signed)
Pt has been notified that he needs appt for this month. He needs appt AND labs. Pt called back wanting to know if he could only do labs. It was explained to him that we would draw labs after visit. He has not been seen since December and no labs since Jan 2023. In order to keep prescribing meds, we need to see in office and draw labs. Front desk has also explained this to him.

## 2023-06-07 NOTE — Telephone Encounter (Signed)
Patient is scheduled for OV 9/23. Patient is asking if he can come in before appointment to do labs and discuss during OV. Please advise.

## 2023-06-25 ENCOUNTER — Ambulatory Visit: Payer: Medicare Other | Admitting: Family Medicine

## 2023-06-25 ENCOUNTER — Ambulatory Visit (INDEPENDENT_AMBULATORY_CARE_PROVIDER_SITE_OTHER): Payer: Medicare Other | Admitting: Family Medicine

## 2023-06-25 ENCOUNTER — Encounter: Payer: Self-pay | Admitting: Family Medicine

## 2023-06-25 VITALS — BP 118/70 | HR 58 | Ht 74.0 in | Wt 227.0 lb

## 2023-06-25 DIAGNOSIS — E876 Hypokalemia: Secondary | ICD-10-CM

## 2023-06-25 DIAGNOSIS — Z23 Encounter for immunization: Secondary | ICD-10-CM

## 2023-06-25 DIAGNOSIS — F419 Anxiety disorder, unspecified: Secondary | ICD-10-CM | POA: Diagnosis not present

## 2023-06-25 DIAGNOSIS — F3342 Major depressive disorder, recurrent, in full remission: Secondary | ICD-10-CM

## 2023-06-25 MED ORDER — CITALOPRAM HYDROBROMIDE 20 MG PO TABS: 20.0000 mg | ORAL_TABLET | Freq: Every day | ORAL | 1 refills | Status: DC

## 2023-06-25 NOTE — Progress Notes (Signed)
Date:  06/25/2023   Name:  [facility] Jasean Pabian.   DOB:  Dec 07, 1945   MRN:  [id number]   Chief Complaint: Depression and Flu Vaccine  Depression        This is a chronic problem.  The current episode started more than 1 year ago.   The problem occurs intermittently.  Associated symptoms include no decreased concentration, no fatigue, no helplessness, no hopelessness, does not have insomnia, not irritable, no restlessness, no decreased interest, no appetite change, no body aches, no myalgias, no headaches, no indigestion, not sad and no suicidal ideas.     The symptoms are aggravated by nothing.  Past treatments include SSRIs - Selective serotonin reuptake inhibitors.  Past compliance problems include medication issues.  Previous treatment provided moderate relief.   Lab Results  Component Value Date   NA 142 10/04/2021   K 3.4 (L) 10/04/2021   CO2 26 10/04/2021   GLUCOSE 88 10/04/2021   BUN 11 10/04/2021   CREATININE 1.19 10/04/2021   CALCIUM 8.9 10/04/2021   EGFR 64 10/04/2021   GFRNONAA >60 02/11/2021   No results found for: "CHOL", "HDL", "LDLCALC", "LDLDIRECT", "TRIG", "CHOLHDL" No results found for: "TSH" Lab Results  Component Value Date   HGBA1C 5.1 10/04/2021   Lab Results  Component Value Date   WBC 7.5 10/04/2021   HGB 14.4 10/04/2021   HCT 42.6 10/04/2021   MCV 89 10/04/2021   PLT 246 10/04/2021   Lab Results  Component Value Date   ALT 16 10/04/2021   AST 20 10/04/2021   ALKPHOS 83 10/04/2021   BILITOT 0.6 10/04/2021   No results found for: "25OHVITD2", "25OHVITD3", "VD25OH"   Review of Systems  Constitutional:  Negative for appetite change and fatigue.  Respiratory:  Negative for cough, shortness of breath and wheezing.   Cardiovascular:  Negative for chest pain and palpitations.  Musculoskeletal:  Negative for myalgias.  Neurological:  Negative for headaches.  Psychiatric/Behavioral:  Positive for depression. Negative for decreased concentration and  suicidal ideas. The patient does not have insomnia.     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   Basal cell papilloma 12/11/2018   Bilirubin in urine 12/11/2018   Chronic venous insufficiency 12/11/2018   Coxitis 12/11/2018   DDD (degenerative disc disease), lumbosacral 12/11/2018   Dermatitis, stasis 12/11/2018   Dysfunction of eustachian tube 12/11/2018   Flu vaccine need 12/11/2018   Heart murmur 12/11/2018   Hypertension 12/11/2018   Impingement syndrome of shoulder 12/11/2018   Insect bite, nonvenomous 12/11/2018   Need for vaccination 12/11/2018   Preop examination 12/11/2018   Sinus infection 12/11/2018   Recurrent major depressive disorder, in full remission (HCC) 05/21/2018   Benign prostatic hyperplasia with lower urinary tract symptoms 12/05/2017   Erectile dysfunction 12/05/2017   Condyloma acuminatum 09/04/2017   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   Presence of artificial shoulder joint 09/03/2014   Osteoarthritis of both knees 07/07/2014   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   OSA (obstructive sleep apnea) 05/08/2014   Abnormal EKG 05/06/2014   Depression 05/06/2014   Obesity 05/06/2014   Urinary urgency 07/22/2012    Allergies  Allergen Reactions   Tramadol     Anger issues   Latex Rash    Past Surgical History:  Procedure Laterality Date   CATARACT EXTRACTION     right   CHOLECYSTECTOMY  2005   COLONOSCOPY  2014   cleared for 10 yrs- Dr Servando Snare   CYSTOSCOPY WITH LITHOLAPAXY N/A 02/15/2021   Procedure: CYSTOSCOPY WITH LITHOLAPAXY;  Surgeon: Riki Altes, MD;  Location: [facility] ORS;  Service: Urology;  Laterality: N/A;   EYE SURGERY Right    GALLBLADDER SURGERY     HIP SURGERY     left   JOINT REPLACEMENT     right and left total hips   TONSILLECTOMY     TOTAL SHOULDER ARTHROPLASTY Left 09/03/2014   Procedure:  TOTAL SHOULDER ARTHROPLASTY;  Surgeon: Mable Paris, MD;  Location: [facility] OR;  Service: Orthopedics;  Laterality: Left;    Social History   Tobacco Use   Smoking status: Former    Current packs/day: 0.00    Average packs/day: 1.5 packs/day for 20.0 years (30.0 ttl pk-yrs)    Types: Cigarettes    Start date: 10/03/1987    Quit date: 10/03/2007    Years since quitting: 15.7   Smokeless tobacco: Never   Tobacco comments:    Has not resumed smoking. No need to provide pt with smoking cessation materials  Vaping Use   Vaping status: Never Used  Substance Use Topics   Alcohol use: No   Drug use: No     Medication list has been reviewed and updated.  Current Meds  Medication Sig   amLODipine (NORVASC) 10 MG tablet Take 10 mg by mouth daily.   cholecalciferol (VITAMIN D3) 25 MCG (1000 UNIT) tablet Take 1,000 Units by mouth daily.   citalopram (CELEXA) 20 MG tablet Take 1 tablet (20 mg total) by mouth daily.   labetalol (NORMODYNE) 100 MG tablet Take 100 mg by mouth daily.   losartan (COZAAR) 100 MG tablet Take 100 mg by mouth daily.   mirabegron ER (MYRBETRIQ) 50 MG TB24 tablet Take 1 tablet (50 mg total) by mouth daily.   Multiple Vitamin (MULTI-VITAMINS) TABS Take 1 tablet by mouth daily.   Potassium 99 MG TABS Take 1 tablet by mouth daily.   sildenafil (REVATIO) 20 MG tablet TAKE 2-5 TABLETS 1 HOUR PRIOR TO INTERCOURSE   sodium chloride (OCEAN) 0.65 % SOLN nasal spray Place 1 spray into both nostrils as needed for congestion.   vitamin E 1000 UNIT capsule Take 1,000 Units by mouth daily.   [DISCONTINUED] labetalol (NORMODYNE) 100 MG tablet Take 1 tablet by mouth 2 (two) times daily.   [DISCONTINUED] potassium chloride (KLOR-CON M) 10 MEQ tablet Take 1 tablet (10 mEq total) by mouth daily.       06/25/2023   11:42 AM 09/12/2022    3:04 PM 10/04/2021   11:17 AM 04/26/2021   10:38 AM  GAD 7 : Generalized Anxiety Score  Nervous, Anxious, on Edge 0 0 0 0  Control/stop  worrying 0 0 0 0  Worry too much - different things 0 0 0 0  Trouble relaxing 0 0 0 0  Restless 0 0 0 0  Easily annoyed or irritable 0 0 0 0  Afraid - awful might happen 0 0 0 0  Total GAD 7 Score 0 0 0 0  Anxiety Difficulty Not difficult at all Not difficult at all Not difficult at all        06/25/2023   11:42 AM 09/12/2022    3:04 PM 02/15/2022   11:47 AM  Depression screen PHQ 2/9  Decreased Interest 0 0 0  Down, Depressed, Hopeless 0 0 0  PHQ - 2 Score 0 0 0  Altered  sleeping 0 0   Tired, decreased energy 0 0   Change in appetite 0 0   Feeling bad or failure about yourself  0 0   Trouble concentrating 0 0   Moving slowly or fidgety/restless 0 0   Suicidal thoughts 0 0   PHQ-9 Score 0 0   Difficult doing work/chores Not difficult at all Not difficult at all     BP Readings from Last 3 Encounters:  06/25/23 118/70  09/13/22 (!) 163/80  09/12/22 120/76    Physical Exam Vitals and nursing note reviewed.  Constitutional:      General: He is not irritable. HENT:     Head: Normocephalic.     Right Ear: Tympanic membrane and external ear normal.     Left Ear: Tympanic membrane and external ear normal.     Nose: Nose normal.     Mouth/Throat:     Mouth: Mucous membranes are moist.  Eyes:     General: No scleral icterus.       Right eye: No discharge.        Left eye: No discharge.     Conjunctiva/sclera: Conjunctivae normal.     Pupils: Pupils are equal, round, and reactive to light.  Neck:     Thyroid: No thyromegaly.     Vascular: No JVD.     Trachea: No tracheal deviation.  Cardiovascular:     Rate and Rhythm: Normal rate and regular rhythm.     Heart sounds: Normal heart sounds. No murmur heard.    No friction rub. No gallop.  Pulmonary:     Effort: No respiratory distress.     Breath sounds: Normal breath sounds. No wheezing, rhonchi or rales.  Abdominal:     General: Bowel sounds are normal.     Palpations: Abdomen is soft. There is no mass.      Tenderness: There is no abdominal tenderness. There is no guarding or rebound.  Musculoskeletal:        General: No tenderness. Normal range of motion.     Cervical back: Normal range of motion and neck supple.  Lymphadenopathy:     Cervical: No cervical adenopathy.  Skin:    General: Skin is warm.     Findings: No rash.  Neurological:     Mental Status: He is alert and oriented to person, place, and time.     Cranial Nerves: No cranial nerve deficit.     Deep Tendon Reflexes: Reflexes are normal and symmetric.     Wt Readings from Last 3 Encounters:  06/25/23 227 lb (103 kg)  09/13/22 220 lb (99.8 kg)  09/12/22 230 lb (104.3 kg)    BP 118/70   Pulse (!) 58   Ht 6\' 2"  (1.88 m)   Wt 227 lb (103 kg)   SpO2 98%   BMI 29.15 kg/m   Assessment and Plan: 1. Anxiety Chronic.  Controlled.  Stable.  GAD score 0 continue citalopram 20 mg once a day.  Will recheck in 6 months. - citalopram (CELEXA) 20 MG tablet; Take 1 tablet (20 mg total) by mouth daily.  Dispense: 90 tablet; Refill: 1  2. Recurrent major depressive disorder, in full remission (HCC) Chronic.  Controlled.  Stable.  PHQ is 0.  Will continue citalopram 20 mg once a day. - citalopram (CELEXA) 20 MG tablet; Take 1 tablet (20 mg total) by mouth daily.  Dispense: 90 tablet; Refill: 1  3. Hypokalemia Chronic.  Controlled.  Stable.  Currently on  supplemental potassium 99 mg.  Will check CMP for electrolytes to ensure that there is stability with supplementation as well as lipid panel for current lipid management by diet. - Comprehensive Metabolic Panel (CMET) - Lipid Panel With LDL/HDL Ratio  4. Need for immunization against influenza Discussed and administered - Flu Vaccine Trivalent High Dose (Fluad)     Elizabeth Sauer, MD

## 2023-06-26 LAB — COMPREHENSIVE METABOLIC PANEL
ALT: 12 IU/L (ref 0–44)
AST: 15 IU/L (ref 0–40)
Albumin: 4.2 g/dL (ref 3.8–4.8)
Alkaline Phosphatase: 95 IU/L (ref 44–121)
BUN/Creatinine Ratio: 11 (ref 10–24)
BUN: 14 mg/dL (ref 8–27)
Bilirubin Total: 0.7 mg/dL (ref 0.0–1.2)
CO2: 22 mmol/L (ref 20–29)
Calcium: 9.3 mg/dL (ref 8.6–10.2)
Chloride: 105 mmol/L (ref 96–106)
Creatinine, Ser: 1.22 mg/dL (ref 0.76–1.27)
Globulin, Total: 2.7 g/dL (ref 1.5–4.5)
Glucose: 88 mg/dL (ref 70–99)
Potassium: 3.6 mmol/L (ref 3.5–5.2)
Sodium: 143 mmol/L (ref 134–144)
Total Protein: 6.9 g/dL (ref 6.0–8.5)
eGFR: 61 mL/min/{1.73_m2} (ref >59)

## 2023-06-26 LAB — LIPID PANEL WITH LDL/HDL RATIO
Cholesterol, Total: 200 mg/dL — ABNORMAL HIGH (ref 100–199)
HDL: 48 mg/dL (ref >39)
LDL Chol Calc (NIH): 134 mg/dL — ABNORMAL HIGH (ref 0–99)
LDL/HDL Ratio: 2.8 ratio (ref 0.0–3.6)
Triglycerides: 99 mg/dL (ref 0–149)
VLDL Cholesterol Cal: 18 mg/dL (ref 5–40)

## 2023-08-28 ENCOUNTER — Other Ambulatory Visit: Payer: Self-pay | Admitting: Urology

## 2023-09-14 ENCOUNTER — Encounter: Payer: Self-pay | Admitting: Urology

## 2023-09-14 ENCOUNTER — Ambulatory Visit: Payer: Medicare Other | Admitting: Urology

## 2023-09-14 VITALS — BP 176/80 | HR 75 | Ht 74.0 in | Wt 225.0 lb

## 2023-09-14 DIAGNOSIS — N200 Calculus of kidney: Secondary | ICD-10-CM

## 2023-09-14 DIAGNOSIS — N3281 Overactive bladder: Secondary | ICD-10-CM

## 2023-09-14 DIAGNOSIS — N3941 Urge incontinence: Secondary | ICD-10-CM | POA: Diagnosis not present

## 2023-09-14 DIAGNOSIS — N401 Enlarged prostate with lower urinary tract symptoms: Secondary | ICD-10-CM

## 2023-09-14 DIAGNOSIS — R350 Frequency of micturition: Secondary | ICD-10-CM | POA: Diagnosis not present

## 2023-09-14 LAB — BLADDER SCAN AMB NON-IMAGING

## 2023-09-14 MED ORDER — GEMTESA 75 MG PO TABS: 75.0000 mg | ORAL_TABLET | Freq: Every day | ORAL | 11 refills | Status: DC

## 2023-09-14 NOTE — Progress Notes (Signed)
I,Jeremy Z Plume,acting as a Neurosurgeon for Riki Altes, MD.,have documented all relevant documentation on the behalf of Riki Altes, MD,as directed by  Riki Altes, MD while in the presence of Riki Altes, MD.  09/14/23 12:00 PM   [address] [place]. Jan 31, 1946 [id number]  Referring provider: Duanne Limerick, MD [address] [place],  [place] [address]  Chief Complaint  Patient presents with   Benign Prostatic Hypertrophy   Over Active Bladder    Urologic history: 1.  BPH with lower urinary tract symptoms -PVP January 2014 -Storage related voiding symptoms -Cystoscopy 12/2019 with right lateral lobe regrowth -TRUS volume 24 cc -On Myrbetriq    2.  Erectile dysfunction -sildenafil  3.  Prostatic calculus -Irritative voiding symptoms 12/2020 -Found to have a 3 cm prostatic calculus  -Cystolitholapaxy 01/2021  4.  Nephrolithiasis - Bilateral, nonobstructing renal calculi  HPI: 77 y.o. male presents for follow-up.  He remains on mirabegron 50 mg daily with improvement in his symptoms, though he still has urgency, with occasional episodes of urge incontinence Denies dysuria, gross hematuria Denies flank, abdominal, or pelvic pain He is getting generic myrbetriq for approximately $70 per month. He inquired about any other medications which my work better   PMH: Past Medical History:  Diagnosis Date   Actinic keratosis    Allergic rhinitis    Anxiety    Arthritis    Bilirubinuria    BPH (benign prostatic hyperplasia)    Degeneration, intervertebral disc, lumbosacral    Erectile dysfunction    Eustachian tube dysfunction    Fibromyalgia    GERD (gastroesophageal reflux disease)    OTC if needed Pepcid   Heart murmur    Hypertension    Osteoarthrosis    Sinusitis    Sleep apnea    uses CPAP, will bring mask   Viral warts     Surgical History: Past Surgical History:  Procedure Laterality Date   CATARACT EXTRACTION     right    CHOLECYSTECTOMY  2005   COLONOSCOPY  2014   cleared for 10 yrs- Dr Servando Snare   CYSTOSCOPY WITH LITHOLAPAXY N/A 02/15/2021   Procedure: CYSTOSCOPY WITH LITHOLAPAXY;  Surgeon: Riki Altes, MD;  Location: [facility] ORS;  Service: Urology;  Laterality: N/A;   EYE SURGERY Right    GALLBLADDER SURGERY     HIP SURGERY     left   JOINT REPLACEMENT     right and left total hips   TONSILLECTOMY     TOTAL SHOULDER ARTHROPLASTY Left 09/03/2014   Procedure: TOTAL SHOULDER ARTHROPLASTY;  Surgeon: Mable Paris, MD;  Location: [facility] OR;  Service: Orthopedics;  Laterality: Left;    Home Medications:  Allergies as of 09/14/2023       Reactions   Tramadol    Anger issues   Latex Rash        Medication List        Accurate as of September 14, 2023 12:00 PM. If you have any questions, ask your nurse or doctor.          STOP taking these medications    mirabegron ER 50 MG Tb24 tablet Commonly known as: MYRBETRIQ Stopped by: Riki Altes       TAKE these medications    amLODipine 10 MG tablet Commonly known as: NORVASC Take 1 tablet by mouth 2 (two) times daily. What changed: Another medication with the same name was removed. Continue taking this medication, and follow  the directions you see here. Changed by: Riki Altes   cholecalciferol 25 MCG (1000 UNIT) tablet Commonly known as: VITAMIN D3 Take 1,000 Units by mouth daily.   citalopram 20 MG tablet Commonly known as: CELEXA Take 1 tablet (20 mg total) by mouth daily.   Gemtesa 75 MG Tabs Generic drug: Vibegron Take 1 tablet (75 mg total) by mouth daily. Started by: Riki Altes   labetalol 100 MG tablet Commonly known as: NORMODYNE Take 100 mg by mouth daily.   losartan 100 MG tablet Commonly known as: COZAAR Take 100 mg by mouth daily.   Multi-Vitamins Tabs Take 1 tablet by mouth daily.   Potassium 99 MG Tabs Take 1 tablet by mouth daily.   sildenafil 20 MG tablet Commonly known as:  REVATIO TAKE 2-5 TABLETS 1 HOUR PRIOR TO INTERCOURSE   sodium chloride 0.65 % Soln nasal spray Commonly known as: OCEAN Place 1 spray into both nostrils as needed for congestion.   vitamin E 1000 UNIT capsule Take 1,000 Units by mouth daily.        Allergies:  Allergies  Allergen Reactions   Tramadol     Anger issues   Latex Rash    Family History: Family History  Problem Relation Age of Onset   Breast cancer Mother    Hypertension Father    Heart attack Father    Stroke Father     Social History:  reports that he quit smoking about 15 years ago. His smoking use included cigarettes. He started smoking about 35 years ago. He has a 30 pack-year smoking history. He has never used smokeless tobacco. He reports that he does not drink alcohol and does not use drugs.   Physical Exam: BP (!) 176/80 (BP Location: Left Arm, Patient Position: Sitting, Cuff Size: Large)   Pulse 75   Ht 6\' 2"  (1.88 m)   Wt 225 lb (102.1 kg)   BMI 28.89 kg/m   Constitutional:  Alert and oriented, No acute distress. HEENT: [place] AT Respiratory: Normal respiratory effort, no increased work of breathing. Psychiatric: Normal mood and affect.   Assessment & Plan:    1.  Lower urinary tract symptoms He has not tried [place] and was given samples, though was informed this may not be covered by insurance and is expensive We also discussed PTNS and he was provided literature PVR today was 108 mL  2.  Bilateral nephrolithiasis Asymptomatic; KUB in 1 year   I have reviewed the above documentation for accuracy and completeness, and I agree with the above.   Riki Altes, MD  [facility] [address] [place], [place] [address] [phone]

## 2023-09-14 NOTE — Patient Instructions (Signed)

## 2023-10-01 ENCOUNTER — Emergency Department: Payer: Medicare Other

## 2023-10-01 ENCOUNTER — Other Ambulatory Visit: Payer: Self-pay

## 2023-10-01 ENCOUNTER — Emergency Department
Admission: EM | Admit: 2023-10-01 | Discharge: 2023-10-01 | Disposition: A | Discharge status: Home / Self Care | Payer: Medicare Other | Attending: Emergency Medicine | Admitting: Emergency Medicine

## 2023-10-01 DIAGNOSIS — W1789XA Other fall from one level to another, initial encounter: Secondary | ICD-10-CM | POA: Diagnosis not present

## 2023-10-01 DIAGNOSIS — Y92002 Bathroom of unspecified non-institutional (private) residence single-family (private) house as the place of occurrence of the external cause: Secondary | ICD-10-CM | POA: Diagnosis not present

## 2023-10-01 DIAGNOSIS — Z96612 Presence of left artificial shoulder joint: Secondary | ICD-10-CM | POA: Diagnosis not present

## 2023-10-01 DIAGNOSIS — T84020A Dislocation of internal right hip prosthesis, initial encounter: Secondary | ICD-10-CM | POA: Insufficient documentation

## 2023-10-01 DIAGNOSIS — S79911A Unspecified injury of right hip, initial encounter: Secondary | ICD-10-CM | POA: Diagnosis not present

## 2023-10-01 DIAGNOSIS — Z96641 Presence of right artificial hip joint: Secondary | ICD-10-CM | POA: Insufficient documentation

## 2023-10-01 DIAGNOSIS — R000 Tachycardia, unspecified: Secondary | ICD-10-CM | POA: Diagnosis not present

## 2023-10-01 DIAGNOSIS — M25551 Pain in right hip: Secondary | ICD-10-CM | POA: Diagnosis not present

## 2023-10-01 DIAGNOSIS — Z043 Encounter for examination and observation following other accident: Secondary | ICD-10-CM | POA: Diagnosis not present

## 2023-10-01 DIAGNOSIS — M47816 Spondylosis without myelopathy or radiculopathy, lumbar region: Secondary | ICD-10-CM | POA: Diagnosis not present

## 2023-10-01 DIAGNOSIS — M25572 Pain in left ankle and joints of left foot: Secondary | ICD-10-CM | POA: Diagnosis not present

## 2023-10-01 DIAGNOSIS — Z471 Aftercare following joint replacement surgery: Secondary | ICD-10-CM | POA: Diagnosis not present

## 2023-10-01 DIAGNOSIS — I10 Essential (primary) hypertension: Secondary | ICD-10-CM | POA: Diagnosis not present

## 2023-10-01 DIAGNOSIS — S73004A Unspecified dislocation of right hip, initial encounter: Secondary | ICD-10-CM | POA: Diagnosis not present

## 2023-10-01 DIAGNOSIS — W19XXXA Unspecified fall, initial encounter: Secondary | ICD-10-CM | POA: Diagnosis not present

## 2023-10-01 MED ORDER — PROPOFOL 10 MG/ML IV BOLUS
1.0000 mg/kg | Freq: Once | INTRAVENOUS | Status: AC
  Administered 2023-10-01: 102 mg via INTRAVENOUS
  Filled 2023-10-01: qty 20

## 2023-10-01 MED ORDER — PROPOFOL 10 MG/ML IV BOLUS
INTRAVENOUS | Status: AC | PRN
  Administered 2023-10-01: 20 mg via INTRAVENOUS
  Administered 2023-10-01: 80 mg via INTRAVENOUS

## 2023-10-01 MED ORDER — MORPHINE SULFATE (PF) 4 MG/ML IV SOLN
4.0000 mg | Freq: Once | INTRAVENOUS | Status: AC
  Administered 2023-10-01: 4 mg via INTRAVENOUS
  Filled 2023-10-01: qty 1

## 2023-10-01 NOTE — ED Provider Triage Note (Signed)
Emergency Medicine Provider Triage Evaluation Note  [facility] Joe Patton. , a 77 y.o. male  was evaluated in triage.  Pt complains of right hip pain.  Here via EMS with fall at home going to bathroom.  Abrasions.  Hx of hip replacement same side.  Review of Systems  Positive: Right hip pain, abrasions Negative:   Physical Exam  There were no vitals taken for this visit. Gen:   Awake, no distress   Resp:  Normal effort  MSK:   Limited right lower extremity movement with increase pain.  Questionable shortening.  Abrasions noted.   Other:    Medical Decision Making  Medically screening exam initiated at 8:43 AM.  Appropriate orders placed.  Joe Patton. was informed that the remainder of the evaluation will be completed by another provider, this initial triage assessment does not replace that evaluation, and the importance of remaining in the ED until their evaluation is complete.     Tommi Rumps, PA-C 10/01/23 [phone]

## 2023-10-01 NOTE — ED Triage Notes (Signed)
Pt presents to ED from home via EMS with c/o of having a mechanical fall while going tot he restroom. Pt denies hitting head.   100 mcg of fentanyl given by EMS-L AC 20G.

## 2023-10-01 NOTE — ED Provider Notes (Signed)
[facility] Provider Note    Event Date/Time   First MD Initiated Contact with Patient 10/01/23 1056     (approximate)   History   Chief Complaint Hip Pain   HPI  Joe Patton. is a 77 y.o. male with past medical history of hypertension and BPH who presents to the ED complaining of hip pain.  Patient reports that he lost his balance and fell while on the way to the bathroom after getting up this morning.  He felt a pop in his right hip and has had severe pain in his right hip since then with inability to bear weight on his right leg.  He denies hitting his head or losing consciousness, does not take any blood thinners.  He received 100 mcg of fentanyl via IV with EMS, had partial relief of pain that has since returned.  He denies any injuries to his left lower extremity, upper extremities, chest, or abdomen.     Physical Exam   Triage Vital Signs: ED Triage Vitals  Encounter Vitals Group     BP 10/01/23 0846 (!) 169/84     Systolic BP Percentile --      Diastolic BP Percentile --      Pulse Rate 10/01/23 0846 65     Resp 10/01/23 0846 18     Temp 10/01/23 0846 97.9 F (36.6 C)     Temp Source 10/01/23 0846 Oral     SpO2 10/01/23 0846 92 %     Weight 10/01/23 0845 224 lb 13.9 oz (102 kg)     Height 10/01/23 0845 6\' 2"  (1.88 m)     Head Circumference --      Peak Flow --      Pain Score 10/01/23 0845 8     Pain Loc --      Pain Education --      Exclude from Growth Chart --     Most recent vital signs: Vitals:   10/01/23 1434 10/01/23 1436  BP:  (!) 175/86  Pulse: 70 75  Resp: 19 14  Temp:    SpO2: 92% 96%    Constitutional: Alert and oriented. Eyes: Conjunctivae are normal. Head: Atraumatic. Neck: No midline cervical spine tenderness to palpation. Nose: No congestion/rhinnorhea. Mouth/Throat: Mucous membranes are moist.  Cardiovascular: Normal rate, regular rhythm. Grossly normal heart sounds.  2+ radial and DP pulses  bilaterally. Respiratory: Normal respiratory effort.  No retractions. Lungs CTAB.  No chest wall tenderness to palpation. Gastrointestinal: Soft and nontender. No distention. Musculoskeletal: Diffuse tenderness to palpation of right hip with shortening of his right lower extremity.  No tenderness to palpation at left hip, bilateral knees, or bilateral ankles.  No upper extremity bony tenderness to palpation. Neurologic:  Normal speech and language. No gross focal neurologic deficits are appreciated.    ED Results / Procedures / Treatments   Labs (all labs ordered are listed, but only abnormal results are displayed) Labs Reviewed - No data to display  RADIOLOGY Right hip x-ray reviewed and interpreted by me with posterior dislocation of prosthesis, no fracture noted.  PROCEDURES:  Critical Care performed: No  .Sedation  Date/Time: 10/01/2023 12:07 PM  Performed by: Chesley Noon, MD Authorized by: Chesley Noon, MD   Consent:    Consent obtained:  Verbal and written   Consent given by:  Patient   Risks discussed:  Allergic reaction, dysrhythmia, inadequate sedation, nausea, prolonged hypoxia resulting in organ damage, prolonged sedation necessitating reversal, respiratory  compromise necessitating ventilatory assistance and intubation and vomiting   Alternatives discussed:  Analgesia without sedation, anxiolysis and regional anesthesia Universal protocol:    Procedure explained and questions answered to patient or proxy's satisfaction: yes     Relevant documents present and verified: yes     Test results available: yes     Imaging studies available: yes     Required blood products, implants, devices, and special equipment available: yes     Site/side marked: yes     Immediately prior to procedure, a time out was called: yes     Patient identity confirmed:  Verbally with patient Indications:    Procedure necessitating sedation performed by:  Physician performing  sedation Pre-sedation assessment:    Time since last food or drink:  8   ASA classification: class 1 - normal, healthy patient     Mouth opening:  3 or more finger widths   Thyromental distance:  4 finger widths   Mallampati score:  I - soft palate, uvula, fauces, pillars visible   Neck mobility: normal     Pre-sedation assessments completed and reviewed: airway patency, cardiovascular function, hydration status, mental status, nausea/vomiting, pain level, respiratory function and temperature   A pre-sedation assessment was completed prior to the start of the procedure Immediate pre-procedure details:    Reassessment: Patient reassessed immediately prior to procedure     Reviewed: vital signs, relevant labs/tests and NPO status     Verified: bag valve mask available, emergency equipment available, intubation equipment available, IV patency confirmed, oxygen available and suction available   Procedure details (see MAR for exact dosages):    Preoxygenation:  Nasal cannula   Sedation:  Propofol   Intended level of sedation: deep   Intra-procedure monitoring:  Blood pressure monitoring, cardiac monitor, continuous pulse oximetry, frequent LOC assessments, frequent vital sign checks and continuous capnometry   Intra-procedure events: none     Total Provider sedation time (minutes):  10 Post-procedure details:   A post-sedation assessment was completed following the completion of the procedure.   Attendance: Constant attendance by certified staff until patient recovered     Recovery: Patient returned to pre-procedure baseline     Post-sedation assessments completed and reviewed: airway patency, cardiovascular function, hydration status, mental status, nausea/vomiting, pain level, respiratory function and temperature     Patient is stable for discharge or admission: yes     Procedure completion:  Tolerated well, no immediate complications .Sedation  Date/Time: 10/01/2023 2:34 PM  Performed  by: Chesley Noon, MD Authorized by: Chesley Noon, MD   Consent:    Consent obtained:  Verbal and written   Consent given by:  Patient   Risks discussed:  Allergic reaction, dysrhythmia, inadequate sedation, nausea, prolonged hypoxia resulting in organ damage, prolonged sedation necessitating reversal, respiratory compromise necessitating ventilatory assistance and intubation and vomiting   Alternatives discussed:  Analgesia without sedation, anxiolysis and regional anesthesia Universal protocol:    Procedure explained and questions answered to patient or proxy's satisfaction: yes     Relevant documents present and verified: yes     Test results available: yes     Imaging studies available: yes     Required blood products, implants, devices, and special equipment available: yes     Site/side marked: yes     Immediately prior to procedure, a time out was called: yes     Patient identity confirmed:  Verbally with patient Indications:    Procedure necessitating sedation performed by:  Physician performing sedation  Pre-sedation assessment:    Time since last food or drink:  12   ASA classification: class 1 - normal, healthy patient     Mouth opening:  3 or more finger widths   Thyromental distance:  4 finger widths   Mallampati score:  I - soft palate, uvula, fauces, pillars visible   Neck mobility: normal     Pre-sedation assessments completed and reviewed: airway patency, cardiovascular function, hydration status, mental status, nausea/vomiting, pain level, respiratory function and temperature   A pre-sedation assessment was completed prior to the start of the procedure Immediate pre-procedure details:    Reassessment: Patient reassessed immediately prior to procedure     Reviewed: vital signs, relevant labs/tests and NPO status     Verified: bag valve mask available, emergency equipment available, intubation equipment available, IV patency confirmed, oxygen available and suction  available   Procedure details (see MAR for exact dosages):    Preoxygenation:  Nasal cannula   Sedation:  Propofol   Intended level of sedation: deep   Intra-procedure monitoring:  Blood pressure monitoring, cardiac monitor, continuous pulse oximetry, frequent LOC assessments, frequent vital sign checks and continuous capnometry   Intra-procedure events: none     Intra-procedure management:  Airway repositioning   Total Provider sedation time (minutes):  15 Post-procedure details:   A post-sedation assessment was completed following the completion of the procedure.   Attendance: Constant attendance by certified staff until patient recovered     Recovery: Patient returned to pre-procedure baseline     Post-sedation assessments completed and reviewed: airway patency, cardiovascular function, hydration status, mental status, nausea/vomiting, pain level, respiratory function and temperature     Patient is stable for discharge or admission: yes     Procedure completion:  Tolerated well, no immediate complications    MEDICATIONS ORDERED IN ED: Medications  morphine (PF) 4 MG/ML injection 4 mg (4 mg Intravenous Given 10/01/23 1125)  propofol (DIPRIVAN) 10 mg/mL bolus/IV push 102 mg (102 mg Intravenous Given 10/01/23 1232)  propofol (DIPRIVAN) 10 mg/mL bolus/IV push (20 mg Intravenous Given 10/01/23 1159)  morphine (PF) 4 MG/ML injection 4 mg (4 mg Intravenous Given 10/01/23 1317)  propofol (DIPRIVAN) 10 mg/mL bolus/IV push 102 mg (102 mg Intravenous Given 10/01/23 1421)     IMPRESSION / MDM / ASSESSMENT AND PLAN / ED COURSE  I reviewed the triage vital signs and the nursing notes.                              77 y.o. male with past medical history of hypertension and BPH who presents to the ED complaining of right hip pain after falling while walking to the bathroom at home earlier today.  Patient's presentation is most consistent with acute presentation with potential threat to life or  bodily function.  Differential diagnosis includes, but is not limited to, fracture, dislocation, contusion, neurovascular compromise.  Patient nontoxic-appearing and in no acute distress, vital signs are unremarkable.  He has obvious deformity to his right hip but remains neurovascularly intact distally.  No evidence of injury to his left lower extremity, upper extremities, trunk, head, or neck.  Right hip x-ray shows dislocation of prosthesis, no evidence of fracture.  We will treat pain with IV morphine, plan to attempt reduction following conscious sedation with IV propofol.  Chest x-ray and ankle x-ray are unremarkable.  Initial attempt at reduction of right hip dislocation by myself was unsuccessful.  Case discussed with  Dr. Clemencia Course of orthopedics, who came to the bedside and was able to reduce hip dislocation following additional round of propofol for conscious sedation.  Patient now awake and alert, reports resolution in right hip pain and remains neurovascular intact.  Patient placed in right knee immobilizer per Dr. Clemencia Course and appropriate for discharge home with orthopedic follow-up.  He was counseled to return to the ED for new or worsening symptoms, patient and spouse agree with plan.      FINAL CLINICAL IMPRESSION(S) / ED DIAGNOSES   Final diagnoses:  Dislocation of right hip, initial encounter [facility])     Rx / DC Orders   ED Discharge Orders     None        Note:  This document was prepared using Dragon voice recognition software and may include unintentional dictation errors.   Chesley Noon, MD 10/01/23 [phone]

## 2023-10-01 NOTE — Consult Note (Signed)
Orthopedic Surgery Inpatient/ED Consultation Note  Reason for consult: Dislocated right total hip replacement  Date of consult: October 01, 2023 Referring provider: Chesley Noon, MD  Chief Complaint: Chief Complaint  Patient presents with   Hip Pain    HPI: [facility] Joe Patton. IS A male who is seen today regarding a complaint of right hip pain.  The patient had a stumble and fell and noticed that his right hip had pain and that it popped out of socket.  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 Ernest Pine 8 or 9 years ago.  He had popped out once many many years ago and since that time has been doing well.  No concerns prior to this episode of him falling.  X-rays confirmed a posterior superior right hip dislocation with a total hip replacement in place.  Attempted reduction by the ER was unsuccessful.  I was consulted  Nature: Acute Location: Right hip Intensity: Mild to moderate Character: Sharp pain Modifying Factors: Movement worse Prior surgery: Total hip replacement 8 or 9 years ago Was there trauma: Yes ground-level fall  Past Medical History: Reviewed. Past Medical History:  Diagnosis Date   Actinic keratosis    Allergic rhinitis    Anxiety    Arthritis    Bilirubinuria    BPH (benign prostatic hyperplasia)    Degeneration, intervertebral disc, lumbosacral    Erectile dysfunction    Eustachian tube dysfunction    Fibromyalgia    GERD (gastroesophageal reflux disease)    OTC if needed Pepcid   Heart murmur    Hypertension    Osteoarthrosis    Sinusitis    Sleep apnea    uses CPAP, will bring mask   Viral warts     Past Surgical History: Reviewed. Past Surgical History:  Procedure Laterality Date   CATARACT EXTRACTION     right   CHOLECYSTECTOMY  2005   COLONOSCOPY  2014   cleared for 10 yrs- Dr Servando Snare   CYSTOSCOPY WITH LITHOLAPAXY N/A 02/15/2021   Procedure: CYSTOSCOPY WITH LITHOLAPAXY;  Surgeon: Riki Altes, MD;  Location: [facility] ORS;  Service:  Urology;  Laterality: N/A;   EYE SURGERY Right    GALLBLADDER SURGERY     HIP SURGERY     left   JOINT REPLACEMENT     right and left total hips   TONSILLECTOMY     TOTAL SHOULDER ARTHROPLASTY Left 09/03/2014   Procedure: TOTAL SHOULDER ARTHROPLASTY;  Surgeon: Mable Paris, MD;  Location: [facility] OR;  Service: Orthopedics;  Laterality: Left;    Family History: Reviewed. The family history includes Breast cancer in his mother; Heart attack in his father; Hypertension in his father; Stroke in his father.  Social History: Reviewed. Social History   Socioeconomic History   Marital status: Married    Spouse name: Not on file   Number of children: 1   Years of education: Not on file   Highest education level: Not on file  Occupational History   Occupation: retired  Tobacco Use   Smoking status: Former    Current packs/day: 0.00    Average packs/day: 1.5 packs/day for 20.0 years (30.0 ttl pk-yrs)    Types: Cigarettes    Start date: 10/03/1987    Quit date: 10/03/2007    Years since quitting: 16.0   Smokeless tobacco: Never   Tobacco comments:    Has not resumed smoking. No need to provide pt with smoking cessation materials  Vaping Use   Vaping status:  Never Used  Substance and Sexual Activity   Alcohol use: No   Drug use: No   Sexual activity: Yes  Other Topics Concern   Not on file  Social History Narrative   Not on file   Social Drivers of Health   Financial Resource Strain: Low Risk  (02/15/2022)   Overall Financial Resource Strain (CARDIA)    Difficulty of Paying Living Expenses: Not hard at all  Food Insecurity: No Food Insecurity (02/15/2022)   Hunger Vital Sign    Worried About Running Out of Food in the Last Year: Never true    Ran Out of Food in the Last Year: Never true  Transportation Needs: No Transportation Needs (02/15/2022)   PRAPARE - Administrator, Civil Service (Medical): No    Lack of Transportation (Non-Medical): No  Physical  Activity: Insufficiently Active (02/15/2022)   Exercise Vital Sign    Days of Exercise per Week: 7 days    Minutes of Exercise per Session: 20 min  Stress: No Stress Concern Present (02/15/2022)   [employer] of Occupational Health - Occupational Stress Questionnaire    Feeling of Stress : Not at all  Social Connections: Socially Integrated (02/15/2022)   Social Connection and Isolation Panel [NHANES]    Frequency of Communication with Friends and Family: More than three times a week    Frequency of Social Gatherings with Friends and Family: More than three times a week    Attends Religious Services: More than 4 times per year    Active Member of [employer] or Organizations: Yes    Attends Engineer, structural: More than 4 times per year    Marital Status: Married    Home Meds: Reviewed. Prior to Admission medications   Medication Sig Start Date End Date Taking? Authorizing Provider  amLODipine (NORVASC) 10 MG tablet Take 1 tablet by mouth 2 (two) times daily. 04/02/23   [provider]  cholecalciferol (VITAMIN D3) 25 MCG (1000 UNIT) tablet Take 1,000 Units by mouth daily.    [provider]  citalopram (CELEXA) 20 MG tablet Take 1 tablet (20 mg total) by mouth daily. 06/25/23   Duanne Limerick, MD  labetalol (NORMODYNE) 100 MG tablet Take 100 mg by mouth daily.    [provider]  losartan (COZAAR) 100 MG tablet Take 100 mg by mouth daily.    [provider]  Multiple Vitamin (MULTI-VITAMINS) TABS Take 1 tablet by mouth daily.    [provider]  Potassium 99 MG TABS Take 1 tablet by mouth daily.    [provider]  sildenafil (REVATIO) 20 MG tablet TAKE 2-5 TABLETS 1 HOUR PRIOR TO INTERCOURSE 08/28/23   Stoioff, Verna Czech, MD  sodium chloride (OCEAN) 0.65 % SOLN nasal spray Place 1 spray into both nostrils as needed for congestion.    [provider]  Vibegron (GEMTESA) 75 MG TABS Take 1 tablet (75 mg total) by mouth  daily. 09/14/23   Stoioff, Verna Czech, MD  vitamin E 1000 UNIT capsule Take 1,000 Units by mouth daily.    [provider]    Hospital Medications: Reviewed. No current facility-administered medications for this encounter.   Current Outpatient Medications  Medication Sig Dispense Refill   amLODipine (NORVASC) 10 MG tablet Take 1 tablet by mouth 2 (two) times daily.     cholecalciferol (VITAMIN D3) 25 MCG (1000 UNIT) tablet Take 1,000 Units by mouth daily.     citalopram (CELEXA) 20 MG tablet Take  1 tablet (20 mg total) by mouth daily. 90 tablet 1   labetalol (NORMODYNE) 100 MG tablet Take 100 mg by mouth daily.     losartan (COZAAR) 100 MG tablet Take 100 mg by mouth daily.     Multiple Vitamin (MULTI-VITAMINS) TABS Take 1 tablet by mouth daily.     Potassium 99 MG TABS Take 1 tablet by mouth daily.     sildenafil (REVATIO) 20 MG tablet TAKE 2-5 TABLETS 1 HOUR PRIOR TO INTERCOURSE 90 tablet 2   sodium chloride (OCEAN) 0.65 % SOLN nasal spray Place 1 spray into both nostrils as needed for congestion.     Vibegron (GEMTESA) 75 MG TABS Take 1 tablet (75 mg total) by mouth daily. 30 tablet 11   vitamin E 1000 UNIT capsule Take 1,000 Units by mouth daily.      Allergies: Reviewed. Allergies  Allergen Reactions   Tramadol     Anger issues   Latex Rash    Review of Systems: General ROS: negative for - fever Psychological ROS: negative Ophthalmic ROS: negative ENT ROS: negative Allergy and Immunology ROS: negative Hematological and Lymphatic ROS: negative Endocrine ROS: negative Cardiovascular ROS: negative Gastrointestinal ROS: negative Musculoskeletal ROS: negative except HPI Neurological ROS: negative Dermatological ROS: negative  Physical Exam: \ Today's Vitals   10/01/23 1430 10/01/23 1434 10/01/23 1436 10/01/23 1449  BP: (!) 145/78  (!) 175/86   Pulse: 67 70 75   Resp: 14 19 14    Temp:      TempSrc:      SpO2: 95% 92% 96%   Weight:      Height:      PainSc:     4    Body mass index is 28.87 kg/m.     General Appearance: alert, appears stated age, cooperative, in no acute distress Head: Normocephalic, without obvious abnormality, atraumatic Eyes: No scleral injection or drainage Ears: Gross hearing intact Neck: No JVD, supple, symmetrical, trachea midline Lungs: Non-labored breathing Heart: Regular rate and rhythm Extremities: The right lower extremity shortened and rotated.  No active range of motion due to sharp pain in the hip.  He can move the ankle foot and toes well.  BCR.  Silt.  No sign of expanding soft tissue process.  Tenderness localizes to the right hip aspect Pulses: 2+ and symmetric Skin: Normal except as noted in examination Neurologic: Grossly normal   Imaging/Diagnostics: X-rays in the ER show a superior posterior total hip dislocation. Post reduction x-rays reviewed and show concentric realignment.   Labs: I have reviewed all of the pertinent labs in the patient chart. WBC  Date Value Ref Range Status  10/04/2021 7.5 3.4 - 10.8 x10E3/uL Final  02/11/2021 7.4 4.0 - 10.5 K/uL Final   Hemoglobin  Date Value Ref Range Status  10/04/2021 14.4 13.0 - 17.7 g/dL Final   Hematocrit  Date Value Ref Range Status  10/04/2021 42.6 37.5 - 51.0 % Final   Sodium  Date Value Ref Range Status  06/25/2023 143 134 - 144 mmol/L Final  01/10/2013 139 136 - 145 mmol/L Final   Potassium  Date Value Ref Range Status  06/25/2023 3.6 3.5 - 5.2 mmol/L Final  01/10/2013 3.5 3.5 - 5.1 mmol/L Final   Chloride  Date Value Ref Range Status  06/25/2023 105 96 - 106 mmol/L Final  01/10/2013 106 98 - 107 mmol/L Final   BUN  Date Value Ref Range Status  06/25/2023 14 8 - 27 mg/dL Final  16/07/9603 13 7 -  18 mg/dL Final   Glucose  Date Value Ref Range Status  02/11/2021 82  Final   INR  Date Value Ref Range Status  10/04/2021 1.0 0.9 - 1.2 Final    Comment:    Reference interval is for non-anticoagulated  patients. Suggested INR therapeutic range for Vitamin K antagonist therapy:    Standard Dose (moderate intensity                   therapeutic range):       2.0 - 3.0    Higher intensity therapeutic range       2.5 - 3.5   01/06/2013 1.0  Final    Comment:    INR reference interval applies to patients on anticoagulant therapy. A single INR therapeutic range for coumarins is not optimal for all indications; however, the suggested range for most indications is 2.0 - 3.0. Exceptions to the INR Reference Range may include: Prosthetic heart valves, acute myocardial infarction, prevention of myocardial infarction, and combinations of aspirin and anticoagulant. The need for a higher or lower target INR must be assessed individually. Reference: The Pharmacology and Management of the Vitamin K  antagonists: the seventh ACCP Conference on Antithrombotic and Thrombolytic Therapy. Chest.2004 Sept:126 (3suppl): L7870634. A HCT value >55% may artifactually increase the PT.  In one study,  the increase was an average of 25%. Reference:  "Effect on Routine and Special Coagulation Testing Values of Citrate Anticoagulant Adjustment in Patients with High HCT Values." American Journal of Clinical Pathology 2006;126:400-405.        Assessment/Plan:  77 year old male with a fall and a dislocation of his right hip.  Consent was obtained and an orthopedic closed reduction manipulation maneuver was done to reduce the hip with the assistance from the ER team.  An audible clunk and an appreciable clunk was felt and the restoration of the leg length was done.  Reduction was done under conscious sedation by the ER.  Patient was placed in a knee immobilizer.  Plan is to discharge home today.  They will call the orthopedic office for follow-up in 1 to 2 weeks. Everything discussed with the patient and his wife.  All in agreement   Thank you  Nada Boozer, MD, Orthopedic  [facility]     Signed: Nada Boozer, MD, Orthopedic Surgeon [facility]  12/30/20242:53 PM  Note: Dictation was assisted using commercial voice recognition software.There may be uncorrected typographical errors within the document.  Please be aware of this information when reading or considering this document. Thank you.

## 2023-10-08 ENCOUNTER — Telehealth: Payer: Self-pay | Admitting: *Deleted

## 2023-10-08 DIAGNOSIS — R9431 Abnormal electrocardiogram [ECG] [EKG]: Secondary | ICD-10-CM | POA: Diagnosis not present

## 2023-10-08 DIAGNOSIS — I10 Essential (primary) hypertension: Secondary | ICD-10-CM | POA: Diagnosis not present

## 2023-10-08 DIAGNOSIS — M1990 Unspecified osteoarthritis, unspecified site: Secondary | ICD-10-CM | POA: Diagnosis not present

## 2023-10-08 DIAGNOSIS — I2089 Other forms of angina pectoris: Secondary | ICD-10-CM | POA: Diagnosis not present

## 2023-10-08 DIAGNOSIS — R011 Cardiac murmur, unspecified: Secondary | ICD-10-CM | POA: Diagnosis not present

## 2023-10-08 DIAGNOSIS — R0602 Shortness of breath: Secondary | ICD-10-CM | POA: Diagnosis not present

## 2023-10-08 DIAGNOSIS — G4733 Obstructive sleep apnea (adult) (pediatric): Secondary | ICD-10-CM | POA: Diagnosis not present

## 2023-10-08 DIAGNOSIS — Z6832 Body mass index (BMI) 32.0-32.9, adult: Secondary | ICD-10-CM | POA: Diagnosis not present

## 2023-10-08 DIAGNOSIS — E66811 Obesity, class 1: Secondary | ICD-10-CM | POA: Diagnosis not present

## 2023-10-08 NOTE — Telephone Encounter (Signed)
 Pt calling for samples of gemtesa and I offered to send in a rx along with giving him more samples. Pt declined the rx and was told that we would supply him with samples? Pt states he's on a fixed income and can not afford the medication.

## 2023-10-09 ENCOUNTER — Other Ambulatory Visit: Payer: Self-pay | Admitting: Internal Medicine

## 2023-10-09 DIAGNOSIS — I2089 Other forms of angina pectoris: Secondary | ICD-10-CM

## 2023-10-09 DIAGNOSIS — R011 Cardiac murmur, unspecified: Secondary | ICD-10-CM

## 2023-10-10 ENCOUNTER — Telehealth (HOSPITAL_COMMUNITY): Payer: Self-pay | Admitting: *Deleted

## 2023-10-10 NOTE — Telephone Encounter (Signed)
 Attempted to call patient regarding upcoming cardiac CT appointment. Left message on voicemail with name and callback number  Larey Brick RN Navigator Cardiac Imaging [facility] [phone] Office [phone] Cell

## 2023-10-11 ENCOUNTER — Ambulatory Visit
Admission: RE | Admit: 2023-10-11 | Discharge: 2023-10-11 | Disposition: A | Discharge status: Home / Self Care | Payer: Medicare Other | Source: Ambulatory Visit | Attending: Internal Medicine | Admitting: Internal Medicine

## 2023-10-11 ENCOUNTER — Other Ambulatory Visit: Payer: Self-pay | Admitting: Internal Medicine

## 2023-10-11 DIAGNOSIS — R011 Cardiac murmur, unspecified: Secondary | ICD-10-CM | POA: Diagnosis not present

## 2023-10-11 DIAGNOSIS — I2089 Other forms of angina pectoris: Secondary | ICD-10-CM | POA: Diagnosis not present

## 2023-10-11 DIAGNOSIS — R931 Abnormal findings on diagnostic imaging of heart and coronary circulation: Secondary | ICD-10-CM

## 2023-10-11 DIAGNOSIS — I2510 Atherosclerotic heart disease of native coronary artery without angina pectoris: Secondary | ICD-10-CM

## 2023-10-11 MED ORDER — DILTIAZEM HCL 25 MG/5ML IV SOLN: 10.0000 mg | INTRAVENOUS | Status: DC | PRN

## 2023-10-11 MED ORDER — SODIUM CHLORIDE 0.9 % IV BOLUS
150.0000 mL | Freq: Once | INTRAVENOUS | Status: AC
  Administered 2023-10-11: 150 mL via INTRAVENOUS

## 2023-10-11 MED ORDER — IOHEXOL 350 MG/ML SOLN
75.0000 mL | Freq: Once | INTRAVENOUS | Status: AC | PRN
  Administered 2023-10-11: 75 mL via INTRAVENOUS

## 2023-10-11 MED ORDER — NITROGLYCERIN 0.4 MG SL SUBL
0.8000 mg | SUBLINGUAL_TABLET | Freq: Once | SUBLINGUAL | Status: AC
  Administered 2023-10-11: 0.8 mg via SUBLINGUAL

## 2023-10-11 MED ORDER — METOPROLOL TARTRATE 5 MG/5ML IV SOLN
10.0000 mg | Freq: Once | INTRAVENOUS | Status: AC | PRN
  Administered 2023-10-11: 10 mg via INTRAVENOUS

## 2023-10-11 NOTE — Telephone Encounter (Signed)
 Notified patient as instructed, patient will call us back after he thanks about it,

## 2023-10-11 NOTE — Progress Notes (Signed)
 Patient tolerated procedure well. Ambulate w/o difficulty. Denies light headedness or being dizzy. Sitting up drinking water provided. Encouraged to drink extra water today and reasoning explained. Verbalized understanding. All questions answered. ABC intact. No further needs. Discharge from procedure area w/o issues.

## 2023-10-11 NOTE — Telephone Encounter (Signed)
 Unfortunately we can only give samples to try for efficacy and not in lieu of prescription.  We could try a different medication from what he has been on but would have higher side effects of dry mouth and constipation.  He could also see Dr. MacDiarmid if he wanted to discuss second line options

## 2023-10-16 DIAGNOSIS — Z96649 Presence of unspecified artificial hip joint: Secondary | ICD-10-CM | POA: Diagnosis not present

## 2023-10-16 DIAGNOSIS — S73004A Unspecified dislocation of right hip, initial encounter: Secondary | ICD-10-CM | POA: Diagnosis not present

## 2023-10-23 DIAGNOSIS — R0602 Shortness of breath: Secondary | ICD-10-CM | POA: Diagnosis not present

## 2023-11-07 DIAGNOSIS — R011 Cardiac murmur, unspecified: Secondary | ICD-10-CM | POA: Diagnosis not present

## 2023-11-07 DIAGNOSIS — G4733 Obstructive sleep apnea (adult) (pediatric): Secondary | ICD-10-CM | POA: Diagnosis not present

## 2023-11-07 DIAGNOSIS — E66811 Obesity, class 1: Secondary | ICD-10-CM | POA: Diagnosis not present

## 2023-11-07 DIAGNOSIS — I10 Essential (primary) hypertension: Secondary | ICD-10-CM | POA: Diagnosis not present

## 2023-11-07 DIAGNOSIS — R9431 Abnormal electrocardiogram [ECG] [EKG]: Secondary | ICD-10-CM | POA: Diagnosis not present

## 2023-11-07 DIAGNOSIS — Z6832 Body mass index (BMI) 32.0-32.9, adult: Secondary | ICD-10-CM | POA: Diagnosis not present

## 2023-11-07 DIAGNOSIS — I2089 Other forms of angina pectoris: Secondary | ICD-10-CM | POA: Diagnosis not present

## 2023-11-07 DIAGNOSIS — M1990 Unspecified osteoarthritis, unspecified site: Secondary | ICD-10-CM | POA: Diagnosis not present

## 2023-11-07 DIAGNOSIS — R0602 Shortness of breath: Secondary | ICD-10-CM | POA: Diagnosis not present

## 2023-11-14 ENCOUNTER — Encounter
Admission: RE | Disposition: A | Discharge status: Home / Self Care | Payer: Self-pay | Source: Ambulatory Visit | Attending: Internal Medicine

## 2023-11-14 ENCOUNTER — Encounter: Payer: Self-pay | Admitting: Internal Medicine

## 2023-11-14 ENCOUNTER — Ambulatory Visit
Admission: RE | Admit: 2023-11-14 | Discharge: 2023-11-14 | Disposition: A | Discharge status: Home / Self Care | Payer: Medicare Other | Source: Ambulatory Visit | Attending: Internal Medicine | Admitting: Internal Medicine

## 2023-11-14 ENCOUNTER — Other Ambulatory Visit: Payer: Self-pay

## 2023-11-14 DIAGNOSIS — Z7982 Long term (current) use of aspirin: Secondary | ICD-10-CM | POA: Diagnosis not present

## 2023-11-14 DIAGNOSIS — I2582 Chronic total occlusion of coronary artery: Secondary | ICD-10-CM | POA: Insufficient documentation

## 2023-11-14 DIAGNOSIS — I2584 Coronary atherosclerosis due to calcified coronary lesion: Secondary | ICD-10-CM | POA: Diagnosis not present

## 2023-11-14 DIAGNOSIS — R931 Abnormal findings on diagnostic imaging of heart and coronary circulation: Secondary | ICD-10-CM

## 2023-11-14 DIAGNOSIS — I2510 Atherosclerotic heart disease of native coronary artery without angina pectoris: Secondary | ICD-10-CM | POA: Insufficient documentation

## 2023-11-14 DIAGNOSIS — Z01810 Encounter for preprocedural cardiovascular examination: Secondary | ICD-10-CM | POA: Diagnosis not present

## 2023-11-14 HISTORY — PX: LEFT HEART CATH AND CORONARY ANGIOGRAPHY: CATH118249

## 2023-11-14 LAB — CBC
HCT: 42.2 % (ref 39.0–52.0)
Hemoglobin: 14.3 g/dL (ref 13.0–17.0)
MCH: 30 pg (ref 26.0–34.0)
MCHC: 33.9 g/dL (ref 30.0–36.0)
MCV: 88.7 fL (ref 80.0–100.0)
Platelets: 222 10*3/uL (ref 150–400)
RBC: 4.76 MIL/uL (ref 4.22–5.81)
RDW: 13 % (ref 11.5–15.5)
WBC: 6.8 10*3/uL (ref 4.0–10.5)
nRBC: 0 % (ref 0.0–0.2)

## 2023-11-14 LAB — BASIC METABOLIC PANEL
Anion gap: 11 (ref 5–15)
BUN: 14 mg/dL (ref 8–23)
CO2: 25 mmol/L (ref 22–32)
Calcium: 9.3 mg/dL (ref 8.9–10.3)
Chloride: 104 mmol/L (ref 98–111)
Creatinine, Ser: 1.14 mg/dL (ref 0.61–1.24)
GFR, Estimated: >60 mL/min (ref >60)
Glucose, Bld: 101 mg/dL — ABNORMAL HIGH (ref 70–99)
Potassium: 3.2 mmol/L — ABNORMAL LOW (ref 3.5–5.1)
Sodium: 140 mmol/L (ref 135–145)

## 2023-11-14 LAB — CARDIAC CATHETERIZATION: Cath EF Quantitative: 55 %

## 2023-11-14 SURGERY — LEFT HEART CATH AND CORONARY ANGIOGRAPHY
Anesthesia: Moderate Sedation | Laterality: Left

## 2023-11-14 MED ORDER — MIDAZOLAM HCL 2 MG/2ML IJ SOLN
INTRAMUSCULAR | Status: DC | PRN
  Administered 2023-11-14: 1 mg via INTRAVENOUS

## 2023-11-14 MED ORDER — VERAPAMIL HCL 2.5 MG/ML IV SOLN
INTRAVENOUS | Status: DC | PRN
  Administered 2023-11-14: 2.5 mg via INTRA_ARTERIAL

## 2023-11-14 MED ORDER — SODIUM CHLORIDE 0.9 % IV SOLN: 250.0000 mL | INTRAVENOUS | Status: DC | PRN

## 2023-11-14 MED ORDER — LIDOCAINE HCL (PF) 1 % IJ SOLN
INTRAMUSCULAR | Status: DC | PRN
  Administered 2023-11-14: 2 mL

## 2023-11-14 MED ORDER — ASPIRIN 81 MG PO CHEW
81.0000 mg | CHEWABLE_TABLET | ORAL | Status: AC
  Administered 2023-11-14: 81 mg via ORAL

## 2023-11-14 MED ORDER — SODIUM CHLORIDE 0.9% FLUSH
3.0000 mL | Freq: Two times a day (BID) | INTRAVENOUS | Status: DC
  Administered 2023-11-14: 3 mL via INTRAVENOUS

## 2023-11-14 MED ORDER — FENTANYL CITRATE (PF) 100 MCG/2ML IJ SOLN
INTRAMUSCULAR | Status: DC | PRN
  Administered 2023-11-14: 50 ug via INTRAVENOUS

## 2023-11-14 MED ORDER — SODIUM CHLORIDE 0.9 % WEIGHT BASED INFUSION
300.0000 mL/h | INTRAVENOUS | Status: AC
  Administered 2023-11-14: 300 mL/h via INTRAVENOUS

## 2023-11-14 MED ORDER — HEPARIN SODIUM (PORCINE) 1000 UNIT/ML IJ SOLN
INTRAMUSCULAR | Status: AC
  Filled 2023-11-14: qty 10

## 2023-11-14 MED ORDER — SODIUM CHLORIDE 0.9 % WEIGHT BASED INFUSION: 1.0000 mL/kg/h | INTRAVENOUS | Status: DC

## 2023-11-14 MED ORDER — HEPARIN (PORCINE) IN NACL 1000-0.9 UT/500ML-% IV SOLN
INTRAVENOUS | Status: AC
  Filled 2023-11-14: qty 1000

## 2023-11-14 MED ORDER — VERAPAMIL HCL 2.5 MG/ML IV SOLN
INTRAVENOUS | Status: AC
  Filled 2023-11-14: qty 2

## 2023-11-14 MED ORDER — ASPIRIN 81 MG PO CHEW
CHEWABLE_TABLET | ORAL | Status: AC
  Filled 2023-11-14: qty 1

## 2023-11-14 MED ORDER — FENTANYL CITRATE (PF) 100 MCG/2ML IJ SOLN
INTRAMUSCULAR | Status: AC
  Filled 2023-11-14: qty 2

## 2023-11-14 MED ORDER — IOHEXOL 300 MG/ML  SOLN
INTRAMUSCULAR | Status: DC | PRN
  Administered 2023-11-14: 132 mL

## 2023-11-14 MED ORDER — MIDAZOLAM HCL 2 MG/2ML IJ SOLN
INTRAMUSCULAR | Status: AC
  Filled 2023-11-14: qty 2

## 2023-11-14 MED ORDER — SODIUM CHLORIDE 0.9 % WEIGHT BASED INFUSION
100.0000 mL/h | INTRAVENOUS | Status: DC
Start: 2023-11-14 — End: 2023-11-14

## 2023-11-14 MED ORDER — LIDOCAINE HCL 1 % IJ SOLN
INTRAMUSCULAR | Status: AC
  Filled 2023-11-14: qty 20

## 2023-11-14 MED ORDER — HEPARIN (PORCINE) IN NACL 2000-0.9 UNIT/L-% IV SOLN
INTRAVENOUS | Status: DC | PRN
  Administered 2023-11-14: 1000 mL

## 2023-11-14 MED ORDER — SODIUM CHLORIDE 0.9% FLUSH: 3.0000 mL | INTRAVENOUS | Status: DC | PRN

## 2023-11-14 SURGICAL SUPPLY — 19 items
CATH 5FR JL3.5 JR4 ANG PIG MP (CATHETERS) IMPLANT
CATH INFINITI 5FR JL4 (CATHETERS) IMPLANT
DEVICE CLOSURE MYNXGRIP 5F (Vascular Products) IMPLANT
DEVICE RAD TR BAND REGULAR (VASCULAR PRODUCTS) IMPLANT
DRAPE BRACHIAL (DRAPES) IMPLANT
GLIDESHEATH SLEND SS 6F .021 (SHEATH) IMPLANT
GUIDEWIRE ANGLED .035 180CM (WIRE) IMPLANT
GUIDEWIRE INQWIRE 1.5J.035X260 (WIRE) IMPLANT
INQWIRE 1.5J .035X260CM (WIRE) ×1
KIT SYRINGE INJ CVI SPIKEX1 (MISCELLANEOUS) IMPLANT
NDL PERC 18GX7CM (NEEDLE) IMPLANT
NEEDLE PERC 18GX7CM (NEEDLE) ×1
PACK CARDIAC CATH (CUSTOM PROCEDURE TRAY) ×1 IMPLANT
PROTECTION STATION PRESSURIZED (MISCELLANEOUS) ×1
SET ATX-X65L (MISCELLANEOUS) IMPLANT
SHEATH AVANTI 5FR X 11CM (SHEATH) IMPLANT
STATION PROTECTION PRESSURIZED (MISCELLANEOUS) IMPLANT
WIRE GUIDERIGHT .035X150 (WIRE) IMPLANT
WIRE HITORQ VERSACORE ST 145CM (WIRE) IMPLANT

## 2023-11-15 ENCOUNTER — Encounter: Payer: Self-pay | Admitting: Internal Medicine

## 2023-11-19 DIAGNOSIS — I25119 Atherosclerotic heart disease of native coronary artery with unspecified angina pectoris: Secondary | ICD-10-CM | POA: Diagnosis not present

## 2023-11-19 DIAGNOSIS — R739 Hyperglycemia, unspecified: Secondary | ICD-10-CM | POA: Diagnosis not present

## 2023-11-23 DIAGNOSIS — Z96641 Presence of right artificial hip joint: Secondary | ICD-10-CM | POA: Diagnosis not present

## 2023-11-23 DIAGNOSIS — S73014D Posterior dislocation of right hip, subsequent encounter: Secondary | ICD-10-CM | POA: Diagnosis not present

## 2023-11-26 DIAGNOSIS — G4733 Obstructive sleep apnea (adult) (pediatric): Secondary | ICD-10-CM | POA: Diagnosis not present

## 2023-11-26 DIAGNOSIS — Z6832 Body mass index (BMI) 32.0-32.9, adult: Secondary | ICD-10-CM | POA: Diagnosis not present

## 2023-11-26 DIAGNOSIS — E6609 Other obesity due to excess calories: Secondary | ICD-10-CM | POA: Diagnosis not present

## 2023-11-26 DIAGNOSIS — I10 Essential (primary) hypertension: Secondary | ICD-10-CM | POA: Diagnosis not present

## 2023-11-26 DIAGNOSIS — R011 Cardiac murmur, unspecified: Secondary | ICD-10-CM | POA: Diagnosis not present

## 2023-11-26 DIAGNOSIS — E66811 Obesity, class 1: Secondary | ICD-10-CM | POA: Diagnosis not present

## 2023-11-26 DIAGNOSIS — I2510 Atherosclerotic heart disease of native coronary artery without angina pectoris: Secondary | ICD-10-CM | POA: Diagnosis not present

## 2023-11-26 DIAGNOSIS — R0602 Shortness of breath: Secondary | ICD-10-CM | POA: Diagnosis not present

## 2023-12-14 ENCOUNTER — Other Ambulatory Visit: Payer: Self-pay | Admitting: Family Medicine

## 2023-12-14 DIAGNOSIS — F3342 Major depressive disorder, recurrent, in full remission: Secondary | ICD-10-CM

## 2023-12-14 DIAGNOSIS — F419 Anxiety disorder, unspecified: Secondary | ICD-10-CM

## 2023-12-14 NOTE — Telephone Encounter (Signed)
 Requested Prescriptions  Pending Prescriptions Disp Refills   citalopram (CELEXA) 20 MG tablet [Pharmacy Med Name: CITALOPRAM HBR 20 MG TABLET] 90 tablet 0    Sig: Take 1 tablet (20 mg total) by mouth daily.     Psychiatry:  Antidepressants - SSRI Passed - 12/14/2023  5:42 PM      Passed - Completed PHQ-2 or PHQ-9 in the last 360 days      Passed - Valid encounter within last 6 months    Recent Outpatient Visits           5 months ago Anxiety   [place] [facility]   1 year ago Recurrent major depressive disorder, in full remission [facility])   [place] [facility]   2 years ago Preop examination   [place] [facility]   2 years ago Recurrent major depressive disorder, in full remission [facility])   [place] [facility]   3 years ago Recurrent major depressive disorder, in full remission [facility])   [place] [facility]       Future Appointments             In 1 week Duanne Limerick, MD [facility] at [facility]   In 9 months Stoioff, Verna Czech, MD [facility] [place]

## 2023-12-24 ENCOUNTER — Ambulatory Visit: Payer: Self-pay | Admitting: Family Medicine

## 2023-12-25 DIAGNOSIS — G4733 Obstructive sleep apnea (adult) (pediatric): Secondary | ICD-10-CM | POA: Diagnosis present

## 2023-12-25 DIAGNOSIS — I9789 Other postprocedural complications and disorders of the circulatory system, not elsewhere classified: Secondary | ICD-10-CM | POA: Diagnosis not present

## 2023-12-25 DIAGNOSIS — N400 Enlarged prostate without lower urinary tract symptoms: Secondary | ICD-10-CM | POA: Diagnosis present

## 2023-12-25 DIAGNOSIS — Z79899 Other long term (current) drug therapy: Secondary | ICD-10-CM | POA: Diagnosis not present

## 2023-12-25 DIAGNOSIS — Z96643 Presence of artificial hip joint, bilateral: Secondary | ICD-10-CM | POA: Diagnosis present

## 2023-12-25 DIAGNOSIS — Z87891 Personal history of nicotine dependence: Secondary | ICD-10-CM | POA: Diagnosis not present

## 2023-12-25 DIAGNOSIS — F419 Anxiety disorder, unspecified: Secondary | ICD-10-CM | POA: Diagnosis present

## 2023-12-25 DIAGNOSIS — E785 Hyperlipidemia, unspecified: Secondary | ICD-10-CM | POA: Diagnosis present

## 2023-12-25 DIAGNOSIS — T81718A Complication of other artery following a procedure, not elsewhere classified, initial encounter: Secondary | ICD-10-CM | POA: Diagnosis present

## 2023-12-25 DIAGNOSIS — I25119 Atherosclerotic heart disease of native coronary artery with unspecified angina pectoris: Secondary | ICD-10-CM | POA: Diagnosis present

## 2023-12-25 DIAGNOSIS — I724 Aneurysm of artery of lower extremity: Secondary | ICD-10-CM | POA: Diagnosis present

## 2023-12-25 DIAGNOSIS — F32A Depression, unspecified: Secondary | ICD-10-CM | POA: Diagnosis present

## 2023-12-25 DIAGNOSIS — I97410 Intraoperative hemorrhage and hematoma of a circulatory system organ or structure complicating a cardiac catheterization: Secondary | ICD-10-CM | POA: Diagnosis not present

## 2023-12-25 DIAGNOSIS — R9389 Abnormal findings on diagnostic imaging of other specified body structures: Secondary | ICD-10-CM | POA: Diagnosis not present

## 2023-12-25 DIAGNOSIS — Z91040 Latex allergy status: Secondary | ICD-10-CM | POA: Diagnosis not present

## 2023-12-25 DIAGNOSIS — I10 Essential (primary) hypertension: Secondary | ICD-10-CM | POA: Diagnosis present

## 2023-12-25 DIAGNOSIS — L409 Psoriasis, unspecified: Secondary | ICD-10-CM | POA: Diagnosis present

## 2023-12-25 DIAGNOSIS — D649 Anemia, unspecified: Secondary | ICD-10-CM | POA: Diagnosis present

## 2023-12-26 DIAGNOSIS — R9389 Abnormal findings on diagnostic imaging of other specified body structures: Secondary | ICD-10-CM | POA: Diagnosis not present

## 2023-12-26 DIAGNOSIS — Z87891 Personal history of nicotine dependence: Secondary | ICD-10-CM | POA: Diagnosis not present

## 2023-12-26 DIAGNOSIS — G4733 Obstructive sleep apnea (adult) (pediatric): Secondary | ICD-10-CM | POA: Diagnosis present

## 2023-12-26 DIAGNOSIS — N400 Enlarged prostate without lower urinary tract symptoms: Secondary | ICD-10-CM | POA: Diagnosis present

## 2023-12-26 DIAGNOSIS — Z91040 Latex allergy status: Secondary | ICD-10-CM | POA: Diagnosis not present

## 2023-12-26 DIAGNOSIS — I724 Aneurysm of artery of lower extremity: Secondary | ICD-10-CM | POA: Diagnosis present

## 2023-12-26 DIAGNOSIS — I97410 Intraoperative hemorrhage and hematoma of a circulatory system organ or structure complicating a cardiac catheterization: Secondary | ICD-10-CM | POA: Diagnosis not present

## 2023-12-26 DIAGNOSIS — I9789 Other postprocedural complications and disorders of the circulatory system, not elsewhere classified: Secondary | ICD-10-CM | POA: Diagnosis not present

## 2023-12-26 DIAGNOSIS — I25119 Atherosclerotic heart disease of native coronary artery with unspecified angina pectoris: Secondary | ICD-10-CM | POA: Diagnosis present

## 2023-12-26 DIAGNOSIS — F419 Anxiety disorder, unspecified: Secondary | ICD-10-CM | POA: Diagnosis present

## 2023-12-26 DIAGNOSIS — T81718A Complication of other artery following a procedure, not elsewhere classified, initial encounter: Secondary | ICD-10-CM | POA: Diagnosis present

## 2023-12-26 DIAGNOSIS — Z96643 Presence of artificial hip joint, bilateral: Secondary | ICD-10-CM | POA: Diagnosis present

## 2023-12-26 DIAGNOSIS — I10 Essential (primary) hypertension: Secondary | ICD-10-CM | POA: Diagnosis present

## 2023-12-26 DIAGNOSIS — L409 Psoriasis, unspecified: Secondary | ICD-10-CM | POA: Diagnosis present

## 2023-12-26 DIAGNOSIS — D649 Anemia, unspecified: Secondary | ICD-10-CM | POA: Diagnosis present

## 2023-12-26 DIAGNOSIS — E785 Hyperlipidemia, unspecified: Secondary | ICD-10-CM | POA: Diagnosis present

## 2023-12-26 DIAGNOSIS — Z79899 Other long term (current) drug therapy: Secondary | ICD-10-CM | POA: Diagnosis not present

## 2023-12-26 DIAGNOSIS — F32A Depression, unspecified: Secondary | ICD-10-CM | POA: Diagnosis present

## 2024-01-28 ENCOUNTER — Ambulatory Visit: Payer: Self-pay | Admitting: Sleep Medicine

## 2024-01-28 ENCOUNTER — Encounter: Payer: Self-pay | Admitting: Sleep Medicine

## 2024-01-28 ENCOUNTER — Telehealth: Payer: Self-pay

## 2024-01-28 VITALS — BP 100/78 | HR 48 | Temp 97.8°F | Ht 74.0 in | Wt 231.0 lb

## 2024-01-28 DIAGNOSIS — G4733 Obstructive sleep apnea (adult) (pediatric): Secondary | ICD-10-CM

## 2024-01-28 DIAGNOSIS — I10 Essential (primary) hypertension: Secondary | ICD-10-CM

## 2024-01-28 DIAGNOSIS — Z87891 Personal history of nicotine dependence: Secondary | ICD-10-CM | POA: Diagnosis not present

## 2024-01-28 DIAGNOSIS — I25810 Atherosclerosis of coronary artery bypass graft(s) without angina pectoris: Secondary | ICD-10-CM | POA: Diagnosis not present

## 2024-01-28 NOTE — Telephone Encounter (Signed)
 Pt called the triage stating he can no longer afford Gemtesa . Pt states he would like samples. Please advise.

## 2024-01-28 NOTE — Telephone Encounter (Signed)
 Our sample supply is very limited and we can only provide for new patients that have not yet tried the medication

## 2024-01-28 NOTE — Telephone Encounter (Signed)
 Pt asked if there is anything else he can try. Pt states in the past he was on mirabegron  and it has went up to $400, which he can not afford, please advise.

## 2024-01-28 NOTE — Progress Notes (Signed)
 Name:Joe Patton. MRN: [id number] DOB: [birth year]   CHIEF COMPLAINT:  ESTABLISH [place] FOR OSA   HISTORY OF PRESENT ILLNESS:  Joe Patton. is a 78 y.o. w/ a h/o OSA, CAD, HTN, anxiety, hyperlipidemia and obesity who presents to establish [facility] for OSA. Reports that he was initially diagnosed with moderate OSA in 2015 and subsequently started on CPAP therapy. Reports using CPAP therapy every night, which is confirmed by compliance data. He is currently using a full face mask, which causes large air leaks. Reports feeling more refreshed upon awakening with CPAP therapy. Reports napping once per week. Reports rare nocturnal awakenings. Denies any significant weight changes. Denies morning headaches, RLS symptoms, dream enactment, cataplexy, hypnagogic or hypnapompic hallucinations. Denies a family history of sleep apnea. Denies drowsy driving. Drinks 1 cup of coffee daily, denies alcohol, tobacco or illicit drug use.   Bedtime 11 pm Sleep onset 10 mins Rise time 7 am   EPWORTH SLEEP SCORE 2     No data to display          PAST MEDICAL HISTORY :   has a past medical history of Actinic keratosis, Allergic rhinitis, Anxiety, Arthritis, Bilirubinuria, BPH (benign prostatic hyperplasia), Degeneration, intervertebral disc, lumbosacral, Erectile dysfunction, Eustachian tube dysfunction, Fibromyalgia, GERD (gastroesophageal reflux disease), Heart murmur, Hypertension, Osteoarthrosis, Sinusitis, Sleep apnea, and Viral warts.  has a past surgical history that includes Cataract extraction; Gallbladder surgery; Hip surgery; Tonsillectomy; Eye surgery (Right); Cholecystectomy (2005); Joint replacement; Total shoulder arthroplasty (Left, 09/03/2014); Colonoscopy (2014); Cystoscopy with litholapaxy (N/A, 02/15/2021); and LEFT HEART CATH AND CORONARY ANGIOGRAPHY (Left, 11/14/2023). Prior to Admission medications   Medication Sig Start Date End Date Taking? Authorizing Provider  amLODipine   (NORVASC ) 10 MG tablet Take 10 mg by mouth daily. 04/02/23  Yes [provider]  ascorbic acid (VITAMIN C) 500 MG tablet Take 500 mg by mouth daily.   Yes [provider]  aspirin  EC 81 MG tablet Take 81 mg by mouth. 12/29/23 12/28/24 Yes [provider]  cholecalciferol (VITAMIN D3) 25 MCG (1000 UNIT) tablet Take 1,000 Units by mouth daily.   Yes [provider]  citalopram  (CELEXA ) 20 MG tablet Take 1 tablet (20 mg total) by mouth daily. 12/14/23  Yes Clarise Crooks, MD  clopidogrel (PLAVIX) 75 MG tablet Take 75 mg by mouth. 12/29/23 12/28/24 Yes [provider]  labetalol  (NORMODYNE ) 100 MG tablet Take 100 mg by mouth daily.   Yes [provider]  losartan  (COZAAR ) 100 MG tablet Take 100 mg by mouth daily.   Yes [provider]  mirabegron  ER (MYRBETRIQ ) 50 MG TB24 tablet Take 50 mg by mouth daily. 08/08/22  Yes [provider]  rosuvastatin (CRESTOR) 20 MG tablet Take 20 mg by mouth. 12/28/23 12/27/24 Yes [provider]  sildenafil  (REVATIO ) 20 MG tablet TAKE 2-5 TABLETS 1 HOUR PRIOR TO INTERCOURSE Patient taking differently: Take 20 mg by mouth daily as needed. TAKE 2-5 TABLETS 1 HOUR PRIOR TO INTERCOURSE 08/28/23  Yes Stoioff, Kizzie Perks, MD  Vibegron  (GEMTESA ) 75 MG TABS Take 1 tablet (75 mg total) by mouth daily. Patient not taking: Reported on 11/12/2023 09/14/23   Geraline Knapp, MD   Allergies  Allergen Reactions   Tramadol      Anger issues   Latex Rash    FAMILY HISTORY:  family history includes Breast cancer in his mother; Heart attack in his father; Hypertension in his father; Stroke in his father. SOCIAL HISTORY:  reports that he quit smoking about 16 years ago. His smoking use included cigarettes. He started smoking about 36 years ago. He has a 30 pack-year smoking history. He has never used smokeless tobacco. He reports that he does not drink alcohol and does not use drugs.   Review of Systems:  Gen:   Denies  fever, sweats, chills weight loss  HEENT: Denies blurred vision, double vision, ear pain, eye pain, hearing loss, nose bleeds, sore throat Cardiac:  No dizziness, chest pain or heaviness, chest tightness,edema, No JVD Resp:   No cough, -sputum production, -shortness of breath,-wheezing, -hemoptysis,  Gi: Denies swallowing difficulty, stomach pain, nausea or vomiting, diarrhea, constipation, bowel incontinence Gu:  Denies bladder incontinence, burning urine Ext:   Denies Joint pain, stiffness or swelling Skin: Denies  skin rash, easy bruising or bleeding or hives Endoc:  Denies polyuria, polydipsia , polyphagia or weight change Psych:   Denies depression, insomnia or hallucinations  Other:  All other systems negative  VITAL SIGNS: BP 100/78 (BP Location: Right Arm, Patient Position: Sitting, Cuff Size: Large)   Pulse (!) 48 Comment: pt reports it is normal for him to have a relatively low hr  Temp 97.8 F (36.6 C) (Oral)   Ht 6\' 2"  (1.88 m)   Wt 231 lb (104.8 kg)   BMI 29.66 kg/m     Physical Examination:   General Appearance: No distress  EYES PERRLA, EOM intact.   NECK Supple, No JVD Pulmonary: normal breath sounds, No wheezing.  CardiovascularNormal S1,S2.  No m/r/g.   Abdomen: Benign, Soft, non-tender. Skin:   warm, no rashes, no ecchymosis  Extremities: normal, no cyanosis, clubbing. Neuro:without focal findings,  speech normal  PSYCHIATRIC: Mood, affect within normal limits.   ASSESSMENT AND PLAN  OSA Due to patient needing a new CPAP device, will complete a repeat HST. Will complete order for new CPAP device after reviewing HST results. Discussed the consequences of untreated sleep apnea. Advised not to drive drowsy for safety of patient and others. Will follow up in 3 months.     HTN Stable, on current management. Following with PCP.   Hyperlipidemia Stable, on current management.    Patient  satisfied with Plan of action and management. All questions  answered  I spent a total of 49 minutes reviewing chart data, face-to-face evaluation with the patient, counseling and coordination of [place] as detailed above.    Elpidia Karn, M.D.  Sleep Medicine [facility]

## 2024-01-28 NOTE — Patient Instructions (Signed)
 Joe Patton

## 2024-01-29 NOTE — Telephone Encounter (Signed)
 Trial solifenacin 10 mg daily #30.  Call back for refills if effective

## 2024-01-30 ENCOUNTER — Other Ambulatory Visit: Payer: Self-pay | Admitting: *Deleted

## 2024-01-30 MED ORDER — SOLIFENACIN SUCCINATE 10 MG PO TABS: 10.0000 mg | ORAL_TABLET | Freq: Every day | ORAL | 0 refills | Status: DC

## 2024-01-30 NOTE — Telephone Encounter (Signed)
 Rx sent

## 2024-01-30 NOTE — Telephone Encounter (Signed)
 Advised patient to call if medication works.

## 2024-02-14 ENCOUNTER — Encounter: Payer: Self-pay | Admitting: Sleep Medicine

## 2024-02-14 ENCOUNTER — Ambulatory Visit: Admitting: Sleep Medicine

## 2024-02-14 VITALS — BP 108/70 | HR 53 | Temp 98.3°F | Ht 74.0 in | Wt 232.0 lb

## 2024-02-14 DIAGNOSIS — G4733 Obstructive sleep apnea (adult) (pediatric): Secondary | ICD-10-CM

## 2024-02-14 NOTE — Progress Notes (Deleted)
 Name:Joe [facility] Care. MRN: [id number] DOB: 11-18-1945   CHIEF COMPLAINT:  EXCESSIVE DAYTIME SLEEPINESS   HISTORY OF PRESENT ILLNESS:  @patientname @ is a 78 y.o. w/ a h/o who present for c/o loud snoring and excessive daytime sleepiness which has been present for several years. Reports nocturnal awakenings due to unclear reasons, however does not have difficulty falling back to sleep. Denies any significant weight changes. Denies morning headaches, RLS symptoms, dream enactment, cataplexy, hypnagogic or hypnapompic hallucinations. Reports a family history of sleep apnea. Denies drowsy driving. Drinks 1-2 sodas daily, occasional alcohol use, former smoker, denies illicit drug use.   Bedtime 11 pm Sleep onset 10 mins Rise time 6:30-7:30 am   EPWORTH SLEEP SCORE***    01/28/2024   10:00 AM  Results of the Epworth flowsheet  Sitting and reading 0  Watching TV 0  Sitting, inactive in a public place (e.g. a theatre or a meeting) 0  As a passenger in a car for an hour without a break 2  Lying down to rest in the afternoon when circumstances permit 0  Sitting and talking to someone 0  Sitting quietly after a lunch without alcohol 0  In a car, while stopped for a few minutes in traffic 0  Total score 2      PAST MEDICAL HISTORY :   has a past medical history of Actinic keratosis, Allergic rhinitis, Anxiety, Arthritis, Bilirubinuria, BPH (benign prostatic hyperplasia), Degeneration, intervertebral disc, lumbosacral, Erectile dysfunction, Eustachian tube dysfunction, Fibromyalgia, GERD (gastroesophageal reflux disease), Heart murmur, Hypertension, Osteoarthrosis, Sinusitis, Sleep apnea, and Viral warts.  has a past surgical history that includes Cataract extraction; Gallbladder surgery; Hip surgery; Tonsillectomy; Eye surgery (Right); Cholecystectomy (2005); Joint replacement; Total shoulder arthroplasty (Left, 09/03/2014); Colonoscopy (2014); Cystoscopy with litholapaxy (N/A,  02/15/2021); and LEFT HEART CATH AND CORONARY ANGIOGRAPHY (Left, 11/14/2023). Prior to Admission medications   Medication Sig Start Date End Date Taking? Authorizing Provider  amLODipine  (NORVASC ) 10 MG tablet Take 10 mg by mouth daily. 04/02/23   [provider]  ascorbic acid (VITAMIN C) 500 MG tablet Take 500 mg by mouth daily.    [provider]  aspirin  EC 81 MG tablet Take 81 mg by mouth. 12/29/23 12/28/24  [provider]  cholecalciferol (VITAMIN D3) 25 MCG (1000 UNIT) tablet Take 1,000 Units by mouth daily.    [provider]  citalopram  (CELEXA ) 20 MG tablet Take 1 tablet (20 mg total) by mouth daily. 12/14/23   Clarise Crooks, MD  clopidogrel (PLAVIX) 75 MG tablet Take 75 mg by mouth. 12/29/23 12/28/24  [provider]  labetalol  (NORMODYNE ) 100 MG tablet Take 100 mg by mouth daily.    [provider]  losartan  (COZAAR ) 100 MG tablet Take 100 mg by mouth daily.    [provider]  rosuvastatin (CRESTOR) 20 MG tablet Take 20 mg by mouth. 12/28/23 12/27/24  [provider]  sildenafil  (REVATIO ) 20 MG tablet TAKE 2-5 TABLETS 1 HOUR PRIOR TO INTERCOURSE Patient taking differently: Take 20 mg by mouth daily as needed. TAKE 2-5 TABLETS 1 HOUR PRIOR TO INTERCOURSE 08/28/23   Stoioff, Kizzie Perks, MD  solifenacin  (VESICARE ) 10 MG tablet Take 1 tablet (10 mg total) by mouth daily. 01/30/24   Geraline Knapp, MD   Allergies  Allergen Reactions   Tramadol      Anger issues   Latex Rash    FAMILY HISTORY:  family history includes Breast cancer in his mother; Heart attack in  his father; Hypertension in his father; Stroke in his father. SOCIAL HISTORY:  reports that he quit smoking about 16 years ago. His smoking use included cigarettes. He started smoking about 36 years ago. He has a 30 pack-year smoking history. He has never used smokeless tobacco. He reports that he does not drink alcohol and does not use drugs.   Review of  Systems:  Gen:  Denies  fever, sweats, chills weight loss  HEENT: Denies blurred vision, double vision, ear pain, eye pain, hearing loss, nose bleeds, sore throat Cardiac:  No dizziness, chest pain or heaviness, chest tightness,edema, No JVD Resp:   No cough, -sputum production, -shortness of breath,-wheezing, -hemoptysis,  Gi: Denies swallowing difficulty, stomach pain, nausea or vomiting, diarrhea, constipation, bowel incontinence Gu:  Denies bladder incontinence, burning urine Ext:   Denies Joint pain, stiffness or swelling Skin: Denies  skin rash, easy bruising or bleeding or hives Endoc:  Denies polyuria, polydipsia , polyphagia or weight change Psych:   Denies depression, insomnia or hallucinations  Other:  All other systems negative  VITAL SIGNS: There were no vitals taken for this visit.   Physical Examination:   General Appearance: No distress  EYES PERRLA, EOM intact.   NECK Supple, No JVD Pulmonary: normal breath sounds, No wheezing.  CardiovascularNormal S1,S2.  No m/r/g.   Abdomen: Benign, Soft, non-tender. Skin:   warm, no rashes, no ecchymosis  Extremities: normal, no cyanosis, clubbing. Neuro:without focal findings,  speech normal  PSYCHIATRIC: Mood, affect within normal limits.   ASSESSMENT AND PLAN  OSA I suspect that OSA is likely present due to clinical presentation. Discussed the consequences of untreated sleep apnea. Advised not to drive drowsy for safety of patient and others. Will complete further evaluation with a home sleep study and follow up to review results.    HTN Stable, on current management. Following with PCP.    MEDICATION ADJUSTMENTS/LABS AND TESTS ORDERED: Recommend Sleep Study   Patient  satisfied with Plan of action and management. All questions answered  Follow up to review HST results and treatment plan.   I spent a total of  *** minutes reviewing chart data, face-to-face evaluation with the patient, counseling and  coordination of care as detailed above.    Marybella Ethier, M.D.  Sleep Medicine [facility]

## 2024-02-18 ENCOUNTER — Encounter

## 2024-02-18 DIAGNOSIS — G4733 Obstructive sleep apnea (adult) (pediatric): Secondary | ICD-10-CM | POA: Diagnosis not present

## 2024-02-18 NOTE — Progress Notes (Signed)
 Appointment was made in error, patient was not seen in clinic by me.

## 2024-02-27 DIAGNOSIS — G4733 Obstructive sleep apnea (adult) (pediatric): Secondary | ICD-10-CM | POA: Diagnosis not present

## 2024-02-27 DIAGNOSIS — G733 Myasthenic syndromes in other diseases classified elsewhere: Secondary | ICD-10-CM | POA: Diagnosis not present

## 2024-03-06 ENCOUNTER — Ambulatory Visit (INDEPENDENT_AMBULATORY_CARE_PROVIDER_SITE_OTHER): Admitting: Urology

## 2024-03-06 ENCOUNTER — Encounter: Payer: Self-pay | Admitting: Urology

## 2024-03-06 VITALS — BP 147/80 | HR 59 | Ht 74.0 in | Wt 230.0 lb

## 2024-03-06 DIAGNOSIS — R310 Gross hematuria: Secondary | ICD-10-CM | POA: Diagnosis not present

## 2024-03-06 DIAGNOSIS — N3281 Overactive bladder: Secondary | ICD-10-CM

## 2024-03-06 LAB — MICROSCOPIC EXAMINATION

## 2024-03-06 LAB — URINALYSIS, COMPLETE
Bilirubin, UA: NEGATIVE
Glucose, UA: NEGATIVE
Ketones, UA: NEGATIVE
Leukocytes,UA: NEGATIVE
Nitrite, UA: NEGATIVE
Protein,UA: NEGATIVE
Specific Gravity, UA: 1.02 (ref 1.005–1.030)
Urobilinogen, Ur: 1 mg/dL (ref 0.2–1.0)
pH, UA: 6 (ref 5.0–7.5)

## 2024-03-06 LAB — BLADDER SCAN AMB NON-IMAGING

## 2024-03-06 NOTE — Progress Notes (Signed)
 03/06/2024 12:58 PM   Joe Patton [address] Joe Patton. 11-12-1945 [id number]  Referring provider: Clarise Crooks, MD No address on file  Urological history: 1. BPH with LU TS - PSA (2021) 0.6 - PVP (2014) - Cysto (2021) right lateral lobe regrowth - TRUS volume 24 cc - Myrbetriq  50 mg, cost prohibitive - Vesicare  10 mg daily   2. ED - sildenafil  20 mg, on-demand-dosing   3. Prostate calculus - cystolitholapaxy (2022)  4.  Nephrolithiasis - KUB (2023) bilateral nephrolithiasis  Chief Complaint  Patient presents with   Hematuria   HPI: Joe Patton. is a 78 y.o. man who presents today for blood in the urine.  Previous records reviewed.   He had some episodes of gross hematuria that he described as pink-tinged urine.  Patient denies any modifying or aggravating factors.  Patient denies any recent UTI's, dysuria or suprapubic/flank pain.  Patient denies any fevers, chills, nausea or vomiting.    UA yellow clear, specific gravity 1.020, pH 6.0, trace blood, 0-5 WBCs, 3-10 RBCs, 0-10 epithelial cells, mucus is are present and few bacteria.  PVR 108 mL   He is also having a difficult time affording his OAB medications.  He had been on Gemtesa  following that that was the most effective, but he has been on other medications as well.  He states some friends of his from church have their physicians call prescription into [place] to get them at a more affordable rate.  PMH: Past Medical History:  Diagnosis Date   Actinic keratosis    Allergic rhinitis    Anxiety    Arthritis    Bilirubinuria    BPH (benign prostatic hyperplasia)    Degeneration, intervertebral disc, lumbosacral    Erectile dysfunction    Eustachian tube dysfunction    Fibromyalgia    GERD (gastroesophageal reflux disease)    OTC if needed Pepcid    Heart murmur    Hypertension    Osteoarthrosis    Sinusitis    Sleep apnea    uses CPAP, will bring mask   Viral warts     Surgical History: Past  Surgical History:  Procedure Laterality Date   CATARACT EXTRACTION     right   CHOLECYSTECTOMY  2005   COLONOSCOPY  2014   cleared for 10 yrs- Dr Ole Berkeley   CYSTOSCOPY WITH LITHOLAPAXY N/A 02/15/2021   Procedure: CYSTOSCOPY WITH LITHOLAPAXY;  Surgeon: Geraline Knapp, MD;  Location: [facility] ORS;  Service: Urology;  Laterality: N/A;   EYE SURGERY Right    GALLBLADDER SURGERY     HIP SURGERY     left   JOINT REPLACEMENT     right and left total hips   LEFT HEART CATH AND CORONARY ANGIOGRAPHY Left 11/14/2023   Procedure: LEFT HEART CATH AND CORONARY ANGIOGRAPHY;  Surgeon: Antonette Batters, MD;  Location: [facility] INV[facility]IVE [facility];  Service: Cardiovascular;  Laterality: Left;   TONSILLECTOMY     TOTAL SHOULDER ARTHROPLASTY Left 09/03/2014   Procedure: TOTAL SHOULDER ARTHROPLASTY;  Surgeon: Derald Flattery, MD;  Location: [facility] OR;  Service: Orthopedics;  Laterality: Left;    Home Medications:  Allergies as of 03/06/2024       Reactions   Tramadol     Anger issues   Latex Rash        Medication List        Accurate as of March 06, 2024 12:58 PM. If you have any questions, ask your nurse or doctor.  amLODipine  10 MG tablet Commonly known as: NORVASC  Take 10 mg by mouth daily.   ascorbic acid 500 MG tablet Commonly known as: VITAMIN C Take 500 mg by mouth daily.   aspirin  EC 81 MG tablet Take 81 mg by mouth.   cholecalciferol 25 MCG (1000 UNIT) tablet Commonly known as: VITAMIN D3 Take 1,000 Units by mouth daily.   citalopram  20 MG tablet Commonly known as: CELEXA  Take 1 tablet (20 mg total) by mouth daily.   clopidogrel 75 MG tablet Commonly known as: PLAVIX Take 75 mg by mouth.   labetalol  100 MG tablet Commonly known as: NORMODYNE  Take 100 mg by mouth daily.   losartan  100 MG tablet Commonly known as: COZAAR  Take 100 mg by mouth daily.   rosuvastatin 20 MG tablet Commonly known as: CRESTOR Take 20 mg by mouth.   sildenafil  20 MG tablet Commonly  known as: REVATIO  TAKE 2-5 TABLETS 1 HOUR PRIOR TO INTERCOURSE What changed: See the new instructions.   solifenacin  10 MG tablet Commonly known as: VESICARE  Take 1 tablet (10 mg total) by mouth daily.        Allergies:  Allergies  Allergen Reactions   Tramadol      Anger issues   Latex Rash    Family History: Family History  Problem Relation Age of Onset   Breast cancer Mother    Hypertension Father    Heart attack Father    Stroke Father     Social History:  reports that he quit smoking about 16 years ago. His smoking use included cigarettes. He started smoking about 36 years ago. He has a 30 pack-year smoking history. He has never used smokeless tobacco. He reports that he does not drink alcohol and does not use drugs.  ROS: Pertinent ROS in HPI  Physical Exam: BP (!) 147/80   Pulse (!) 59   Ht 6\' 2"  (1.88 m)   Wt 230 lb (104.3 kg)   BMI 29.53 kg/m   Constitutional:  Well nourished. Alert and oriented, No acute distress. HEENT: [place] AT, moist mucus membranes.  Trachea midline Cardiovascular: No clubbing, cyanosis, or edema. Respiratory: Normal respiratory effort, no increased work of breathing. Neurologic: Grossly intact, no focal deficits, moving all 4 extremities. Psychiatric: Normal mood and affect.  Laboratory Data: Complete Blood Count (CBC) Order: [id number] Component Ref Range & Units 2 mo ago  WBC (White Blood Cell Count) 3.2 - 9.8 x10^9/L 9.2  Hemoglobin 13.7 - 17.3 g/dL 9.4 Low   Hematocrit 86.5 - 49.0 % 28.4 Low   Platelets 150 - 450 x10^9/L 156  MCV (Mean Corpuscular Volume) 80 - 98 fL 91  MCH (Mean Corpuscular Hemoglobin) 26.5 - 34.0 pg 30.2  MCHC (Mean Corpuscular Hemoglobin Concentration) 31.5 - 36.3 % 33.1  RBC (Red Blood Cell Count) 4.37 - 5.74 x10^12/L 3.11 Low   RDW-CV (Red Cell Distribution Width) 11.5 - 14.5 % 13.3  NRBC (Nucleated Red Blood Cell Count) 0 x10^9/L 0  NRBC % (Nucleated Red Blood Cell %) % 0  MPV (Mean  Platelet Volume) 7.2 - 11.7 fL 10.9  Resulting Agency [facility]   Specimen Collected: 12/28/23 10:22   Performed by: [facility] Last Resulted: 12/28/23 10:34  Received From: [facility]  Result Received: 01/24/24 11:36   Basic Metabolic Panel (BMP) Order: [id number] Component Ref Range & Units 2 mo ago  Sodium 135 - 145 mmol/L 142  Potassium 3.5 - 5.0 mmol/L 3.7  Chloride 98 - 108  mmol/L 111 High   Carbon Dioxide (CO2) 21 - 30 mmol/L 23  Urea Nitrogen (BUN) 7 - 20 mg/dL 23 High   Creatinine 0.6 - 1.3 mg/dL 1.2  Glucose 70 - 161 mg/dL 096  Comment: Interpretive Data: Above is the NONFASTING reference range.  Below are the FASTING reference ranges: NORMAL:      70-99 mg/dL PREDIABETES: 045-409 mg/dL DIABETES:    > 811 mg/dL  Calcium 8.7 - 91.4 mg/dL 8.4 Low   Anion Gap 3 - 12 mmol/L 8  BUN/CREA Ratio 6 - 27 19  Glomerular Filtration Rate (eGFR) mL/min/1.73sq m 63  Comment: CKD-EPI (2021) does not include patient's race in the calculation of eGFR. Monitoring changes of plasma creatinine and eGFR over time is useful for monitoring kidney function.  This change was made on 11/30/2020.  Interpretive Ranges for eGFR(CKD-EPI 2021):  eGFR:              > 60 mL/min/1.73 sq m - Normal eGFR:              30 - 59 mL/min/1.73 sq m - Moderately Decreased eGFR:              15 - 29 mL/min/1.73 sq m - Severely Decreased eGFR:              < 15 mL/min/1.73 sq m -  Kidney Failure   Note: These eGFR calculations do not apply in acute situations when eGFR is changing rapidly or in patients on dialysis.  Resulting Agency [facility]   Specimen Collected: 12/27/23 22:50   Performed by: [facility] Last Resulted: 12/28/23 01:14  Received From: [facility]  Result Received: 01/24/24 11:36   Urinalysis See EPIC and HPI  I have reviewed the labs.   Pertinent  Imaging:  03/06/2024  Scan Result     Assessment & Plan:    1. Overactive bladder (Primary) - I given the phone number for the Gem[employer]a  assistance program to see if he qualifies to get the medication for free - BLADDER SCAN AMB NON-IMAGING - In regards to calling a prescription to a [place] pharmacy, it appears that the process is as a [place]  provider, we can call a [place] pharmacy and " recommend" a medication and in a [place] physician will prescribe and send that to the patient  2. Gross hematuria - CULTURE, URINE COMPREHENSIVE - Urinalysis, Complete - we discussed that there are a number of causes that can be associated with blood in the urine, such as stones, BPH, UTI's, damage to the urinary tract and/or cancer.   Sometimes, we do not find a cause or source of the hematuria.   -we discussed that the recommended protocol for further work up are are CT urogram and cysto   -we discussed that for a CT urogram a contrast material will be injected into a vein and that in rare instances, an allergic reaction can result and may even life threatening (1:100,000)  The patient denies any allergies to contrast, iodine  and/or seafood and is not taking metformin. - we discussed that following the imaging study,  a cystoscopy is performed  -we discussed that a cystoscopy is a procedure that consists of passing a camera up their urethra after administering lidocaine  to anesthetize and that after the procedure a minor amount of blood in the urine and/or burning which usually resolves in 24 to 48 hours may occur  -He did not want to go forward  with a CT urogram stating he had just been at [facility] and had several CTs, I did explain to him that they were only of the chest did not image his kidneys.  He stated he just wanted to go forward with a cystoscopy.  I explained that the cystoscopy will not take the place of a CT urogram as the cystoscopy will not image the kidneys, so even if the cystoscopy is  negative, we do not know if there is any renal cancers or stones present, he voiced his understanding  -the patient had the opportunity to ask questions which were answered. Based upon this discussion, the patient is willing to proceed. Therefore, I've ordered: cystoscopy - The patient will return following all of the above for discussion of the results.  - UA w/ micro heme - Urine culture pending    Return for cysto for gross heme .  These notes generated with voice recognition software. I apologize for typographical errors.  Briant Camper  [facility] [address]  [address] [place], [place] [address] [phone]

## 2024-03-06 NOTE — Patient Instructions (Addendum)
 Gemtesa  [employer] Savings Program   [phone]    Cystoscopy Cystoscopy is a procedure that is used to help diagnose and sometimes treat conditions that affect the lower urinary tract. The lower urinary tract includes the bladder and the urethra. The urethra is the tube that drains urine from the bladder. Cystoscopy is done using a thin, tube-shaped instrument with a light and camera at the end (cystoscope). The cystoscope may be hard or flexible, depending on the goal of the procedure. The cystoscope is inserted through the urethra, into the bladder. Cystoscopy may be recommended if you have: Urinary tract infections that keep coming back. Blood in the urine (hematuria). An inability to control when you urinate (urinary incontinence) or an overactive bladder. Unusual cells found in a urine sample. A blockage in the urethra, such as a urinary stone. Painful urination. An abnormality in the bladder found during an intravenous pyelogram (IVP) or CT scan. What are the risks? Generally, this is a safe procedure. However, problems may occur, including: Infection. Bleeding.  What happens during the procedure?  You will be given one or more of the following: A medicine to numb the area (local anesthetic). The area around the opening of your urethra will be cleaned. The cystoscope will be passed through your urethra into your bladder. Germ-free (sterile) fluid will flow through the cystoscope to fill your bladder. The fluid will stretch your bladder so that your health care provider can clearly examine your bladder walls. Your doctor will look at the urethra and bladder. The cystoscope will be removed The procedure may vary among health care providers  What can I expect after the procedure? After the procedure, it is common to have: Some soreness or pain in your urethra. Urinary symptoms. These include: Mild pain or burning when you urinate. Pain should stop within a few minutes after you  urinate. This may last for up to a few days after the procedure. A small amount of blood in your urine for several days. Feeling like you need to urinate but producing only a small amount of urine. Follow these instructions at home: General instructions Return to your normal activities as told by your health care provider.  Drink plenty of fluids after the procedure. Keep all follow-up visits as told by your health care provider. This is important. Contact a health care provider if you: Have pain that gets worse or does not get better with medicine, especially pain when you urinate lasting longer than 72 hours after the procedure. Have trouble urinating. Get help right away if you: Have blood clots in your urine. Have a fever or chills. Are unable to urinate. Summary Cystoscopy is a procedure that is used to help diagnose and sometimes treat conditions that affect the lower urinary tract. Cystoscopy is done using a thin, tube-shaped instrument with a light and camera at the end. After the procedure, it is common to have some soreness or pain in your urethra. It is normal to have blood in your urine after the procedure.  If you were prescribed an antibiotic medicine, take it as told by your health care provider.  This information is not intended to replace advice given to you by your health care provider. Make sure you discuss any questions you have with your health care provider. Document Revised: 09/10/2018 Document Reviewed: 09/10/2018 Elsevier Patient Education  2020 [employer].

## 2024-03-09 LAB — CULTURE, URINE COMPREHENSIVE

## 2024-03-21 ENCOUNTER — Ambulatory Visit: Payer: Self-pay

## 2024-04-03 ENCOUNTER — Encounter: Payer: Self-pay | Admitting: Urology

## 2024-04-03 ENCOUNTER — Ambulatory Visit (INDEPENDENT_AMBULATORY_CARE_PROVIDER_SITE_OTHER): Admitting: Urology

## 2024-04-03 VITALS — BP 160/78 | HR 96

## 2024-04-03 DIAGNOSIS — N3281 Overactive bladder: Secondary | ICD-10-CM

## 2024-04-03 DIAGNOSIS — R310 Gross hematuria: Secondary | ICD-10-CM | POA: Diagnosis not present

## 2024-04-03 DIAGNOSIS — Z87448 Personal history of other diseases of urinary system: Secondary | ICD-10-CM

## 2024-04-03 DIAGNOSIS — N401 Enlarged prostate with lower urinary tract symptoms: Secondary | ICD-10-CM

## 2024-04-03 MED ORDER — FINASTERIDE 5 MG PO TABS: 5.0000 mg | ORAL_TABLET | Freq: Every day | ORAL | 3 refills | Status: AC

## 2024-04-03 MED ORDER — LIDOCAINE HCL URETHRAL/MUCOSAL 2 % EX GEL
1.0000 | Freq: Once | CUTANEOUS | Status: AC
  Administered 2024-04-03: 1 via URETHRAL

## 2024-04-03 NOTE — Progress Notes (Signed)
   04/03/24  CC:  Chief Complaint  Patient presents with   Cysto    HPI: Refer to [facility]'s note 03/06/2024.  Denies recurrent gross hematuria.  He declined upper tract evaluation with CT urogram.  Blood pressure (!) 160/78, pulse 96. NED. A&Ox3.   No respiratory distress   Abd soft, NT, ND Normal phallus with bilateral descended testicles  Cystoscopy Procedure Note  Patient identification was confirmed, informed consent was obtained, and patient was prepped using Betadine  solution.  Lidocaine  jelly was administered per urethral meatus.     Pre-Procedure: - Inspection reveals a normal caliber urethral meatus.  Procedure: The flexible cystoscope was introduced without difficulty - No urethral strictures/lesions are present. 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  Prominent hypervascularity bladder neck.  Adenoma regrowth occluding outlet   Post-Procedure: - Patient tolerated the procedure well  Assessment/ Plan: Hematuria most likely secondary to prostatic in etiology Right adenoma regrowth occluding outlet Start finasteride 5 mg daily    Antionne Enrique C Peytin Dechert, MD

## 2024-04-04 ENCOUNTER — Encounter: Payer: Self-pay | Admitting: Urology

## 2024-04-16 ENCOUNTER — Observation Stay

## 2024-04-16 ENCOUNTER — Telehealth: Payer: Self-pay

## 2024-04-16 ENCOUNTER — Other Ambulatory Visit: Payer: Self-pay

## 2024-04-16 ENCOUNTER — Ambulatory Visit: Admitting: Physician Assistant

## 2024-04-16 ENCOUNTER — Inpatient Hospital Stay
Admission: EM | Admit: 2024-04-16 | Discharge: 2024-04-20 | DRG: 304 | Disposition: A | Discharge status: Skilled Nursing Facility | Attending: Internal Medicine | Admitting: Internal Medicine

## 2024-04-16 ENCOUNTER — Emergency Department

## 2024-04-16 DIAGNOSIS — Z7902 Long term (current) use of antithrombotics/antiplatelets: Secondary | ICD-10-CM

## 2024-04-16 DIAGNOSIS — R9082 White matter disease, unspecified: Secondary | ICD-10-CM | POA: Diagnosis not present

## 2024-04-16 DIAGNOSIS — N401 Enlarged prostate with lower urinary tract symptoms: Secondary | ICD-10-CM | POA: Diagnosis not present

## 2024-04-16 DIAGNOSIS — R531 Weakness: Secondary | ICD-10-CM | POA: Diagnosis not present

## 2024-04-16 DIAGNOSIS — G8929 Other chronic pain: Secondary | ICD-10-CM | POA: Diagnosis present

## 2024-04-16 DIAGNOSIS — R2681 Unsteadiness on feet: Secondary | ICD-10-CM | POA: Diagnosis not present

## 2024-04-16 DIAGNOSIS — Z79899 Other long term (current) drug therapy: Secondary | ICD-10-CM

## 2024-04-16 DIAGNOSIS — I672 Cerebral atherosclerosis: Secondary | ICD-10-CM | POA: Diagnosis not present

## 2024-04-16 DIAGNOSIS — Z9861 Coronary angioplasty status: Secondary | ICD-10-CM | POA: Diagnosis not present

## 2024-04-16 DIAGNOSIS — Z955 Presence of coronary angioplasty implant and graft: Secondary | ICD-10-CM

## 2024-04-16 DIAGNOSIS — R569 Unspecified convulsions: Secondary | ICD-10-CM | POA: Diagnosis not present

## 2024-04-16 DIAGNOSIS — N182 Chronic kidney disease, stage 2 (mild): Secondary | ICD-10-CM

## 2024-04-16 DIAGNOSIS — Z8673 Personal history of transient ischemic attack (TIA), and cerebral infarction without residual deficits: Secondary | ICD-10-CM | POA: Diagnosis not present

## 2024-04-16 DIAGNOSIS — R410 Disorientation, unspecified: Secondary | ICD-10-CM | POA: Diagnosis not present

## 2024-04-16 DIAGNOSIS — N179 Acute kidney failure, unspecified: Secondary | ICD-10-CM

## 2024-04-16 DIAGNOSIS — F419 Anxiety disorder, unspecified: Secondary | ICD-10-CM | POA: Diagnosis present

## 2024-04-16 DIAGNOSIS — Y92009 Unspecified place in unspecified non-institutional (private) residence as the place of occurrence of the external cause: Secondary | ICD-10-CM | POA: Diagnosis not present

## 2024-04-16 DIAGNOSIS — I160 Hypertensive urgency: Secondary | ICD-10-CM | POA: Diagnosis not present

## 2024-04-16 DIAGNOSIS — R296 Repeated falls: Secondary | ICD-10-CM | POA: Diagnosis present

## 2024-04-16 DIAGNOSIS — I5A Non-ischemic myocardial injury (non-traumatic): Secondary | ICD-10-CM | POA: Diagnosis not present

## 2024-04-16 DIAGNOSIS — Z66 Do not resuscitate: Secondary | ICD-10-CM | POA: Diagnosis present

## 2024-04-16 DIAGNOSIS — I2489 Other forms of acute ischemic heart disease: Secondary | ICD-10-CM | POA: Diagnosis present

## 2024-04-16 DIAGNOSIS — G629 Polyneuropathy, unspecified: Secondary | ICD-10-CM | POA: Diagnosis present

## 2024-04-16 DIAGNOSIS — M170 Bilateral primary osteoarthritis of knee: Secondary | ICD-10-CM | POA: Diagnosis present

## 2024-04-16 DIAGNOSIS — G4733 Obstructive sleep apnea (adult) (pediatric): Secondary | ICD-10-CM | POA: Diagnosis not present

## 2024-04-16 DIAGNOSIS — Z87891 Personal history of nicotine dependence: Secondary | ICD-10-CM

## 2024-04-16 DIAGNOSIS — G319 Degenerative disease of nervous system, unspecified: Secondary | ICD-10-CM | POA: Diagnosis not present

## 2024-04-16 DIAGNOSIS — F418 Other specified anxiety disorders: Secondary | ICD-10-CM | POA: Diagnosis not present

## 2024-04-16 DIAGNOSIS — W19XXXA Unspecified fall, initial encounter: Secondary | ICD-10-CM

## 2024-04-16 DIAGNOSIS — Z736 Limitation of activities due to disability: Secondary | ICD-10-CM | POA: Diagnosis not present

## 2024-04-16 DIAGNOSIS — I5030 Unspecified diastolic (congestive) heart failure: Secondary | ICD-10-CM | POA: Diagnosis not present

## 2024-04-16 DIAGNOSIS — I674 Hypertensive encephalopathy: Secondary | ICD-10-CM | POA: Diagnosis not present

## 2024-04-16 DIAGNOSIS — M797 Fibromyalgia: Secondary | ICD-10-CM | POA: Diagnosis not present

## 2024-04-16 DIAGNOSIS — M160 Bilateral primary osteoarthritis of hip: Secondary | ICD-10-CM | POA: Diagnosis not present

## 2024-04-16 DIAGNOSIS — I6783 Posterior reversible encephalopathy syndrome: Secondary | ICD-10-CM | POA: Diagnosis not present

## 2024-04-16 DIAGNOSIS — E663 Overweight: Secondary | ICD-10-CM | POA: Diagnosis not present

## 2024-04-16 DIAGNOSIS — I161 Hypertensive emergency: Secondary | ICD-10-CM | POA: Diagnosis not present

## 2024-04-16 DIAGNOSIS — N400 Enlarged prostate without lower urinary tract symptoms: Secondary | ICD-10-CM | POA: Diagnosis not present

## 2024-04-16 DIAGNOSIS — I5032 Chronic diastolic (congestive) heart failure: Secondary | ICD-10-CM | POA: Diagnosis not present

## 2024-04-16 DIAGNOSIS — Z91040 Latex allergy status: Secondary | ICD-10-CM

## 2024-04-16 DIAGNOSIS — M25512 Pain in left shoulder: Secondary | ICD-10-CM | POA: Diagnosis present

## 2024-04-16 DIAGNOSIS — I10 Essential (primary) hypertension: Secondary | ICD-10-CM | POA: Diagnosis not present

## 2024-04-16 DIAGNOSIS — I2510 Atherosclerotic heart disease of native coronary artery without angina pectoris: Secondary | ICD-10-CM | POA: Diagnosis present

## 2024-04-16 DIAGNOSIS — E785 Hyperlipidemia, unspecified: Secondary | ICD-10-CM | POA: Diagnosis not present

## 2024-04-16 DIAGNOSIS — E876 Hypokalemia: Secondary | ICD-10-CM

## 2024-04-16 DIAGNOSIS — N189 Chronic kidney disease, unspecified: Secondary | ICD-10-CM | POA: Diagnosis present

## 2024-04-16 DIAGNOSIS — E569 Vitamin deficiency, unspecified: Secondary | ICD-10-CM | POA: Diagnosis not present

## 2024-04-16 DIAGNOSIS — Z6825 Body mass index (BMI) 25.0-25.9, adult: Secondary | ICD-10-CM

## 2024-04-16 DIAGNOSIS — G9389 Other specified disorders of brain: Secondary | ICD-10-CM | POA: Diagnosis not present

## 2024-04-16 DIAGNOSIS — F32A Depression, unspecified: Secondary | ICD-10-CM | POA: Diagnosis present

## 2024-04-16 DIAGNOSIS — R41841 Cognitive communication deficit: Secondary | ICD-10-CM | POA: Diagnosis not present

## 2024-04-16 DIAGNOSIS — W19XXXD Unspecified fall, subsequent encounter: Secondary | ICD-10-CM | POA: Diagnosis not present

## 2024-04-16 DIAGNOSIS — I130 Hypertensive heart and chronic kidney disease with heart failure and stage 1 through stage 4 chronic kidney disease, or unspecified chronic kidney disease: Secondary | ICD-10-CM | POA: Diagnosis not present

## 2024-04-16 DIAGNOSIS — R29818 Other symptoms and signs involving the nervous system: Secondary | ICD-10-CM | POA: Diagnosis not present

## 2024-04-16 DIAGNOSIS — R7989 Other specified abnormal findings of blood chemistry: Secondary | ICD-10-CM | POA: Diagnosis not present

## 2024-04-16 DIAGNOSIS — R42 Dizziness and giddiness: Secondary | ICD-10-CM | POA: Diagnosis not present

## 2024-04-16 DIAGNOSIS — I951 Orthostatic hypotension: Secondary | ICD-10-CM | POA: Diagnosis not present

## 2024-04-16 DIAGNOSIS — F321 Major depressive disorder, single episode, moderate: Secondary | ICD-10-CM | POA: Diagnosis not present

## 2024-04-16 DIAGNOSIS — Z7982 Long term (current) use of aspirin: Secondary | ICD-10-CM

## 2024-04-16 DIAGNOSIS — I6782 Cerebral ischemia: Secondary | ICD-10-CM | POA: Diagnosis not present

## 2024-04-16 DIAGNOSIS — M6259 Muscle wasting and atrophy, not elsewhere classified, multiple sites: Secondary | ICD-10-CM | POA: Diagnosis not present

## 2024-04-16 DIAGNOSIS — I700 Atherosclerosis of aorta: Secondary | ICD-10-CM | POA: Diagnosis not present

## 2024-04-16 DIAGNOSIS — G9340 Encephalopathy, unspecified: Secondary | ICD-10-CM | POA: Diagnosis not present

## 2024-04-16 DIAGNOSIS — M62838 Other muscle spasm: Secondary | ICD-10-CM | POA: Diagnosis not present

## 2024-04-16 LAB — CBC
HCT: 41.2 % (ref 39.0–52.0)
Hemoglobin: 13.8 g/dL (ref 13.0–17.0)
MCH: 28.1 pg (ref 26.0–34.0)
MCHC: 33.5 g/dL (ref 30.0–36.0)
MCV: 83.9 fL (ref 80.0–100.0)
Platelets: 211 K/uL (ref 150–400)
RBC: 4.91 MIL/uL (ref 4.22–5.81)
RDW: 14.7 % (ref 11.5–15.5)
WBC: 7.3 K/uL (ref 4.0–10.5)
nRBC: 0 % (ref 0.0–0.2)

## 2024-04-16 LAB — URINALYSIS, ROUTINE W REFLEX MICROSCOPIC
Bilirubin Urine: NEGATIVE
Glucose, UA: NEGATIVE mg/dL
Hgb urine dipstick: NEGATIVE
Ketones, ur: NEGATIVE mg/dL
Leukocytes,Ua: NEGATIVE
Nitrite: NEGATIVE
Protein, ur: NEGATIVE mg/dL
Specific Gravity, Urine: 1.015 (ref 1.005–1.030)
pH: 6 (ref 5.0–8.0)

## 2024-04-16 LAB — TROPONIN I (HIGH SENSITIVITY)
Troponin I (High Sensitivity): 57 ng/L — ABNORMAL HIGH (ref <18)
Troponin I (High Sensitivity): 57 ng/L — ABNORMAL HIGH (ref <18)

## 2024-04-16 LAB — COMPREHENSIVE METABOLIC PANEL WITH GFR
ALT: 19 U/L (ref 0–44)
AST: 25 U/L (ref 15–41)
Albumin: 4.1 g/dL (ref 3.5–5.0)
Alkaline Phosphatase: 77 U/L (ref 38–126)
Anion gap: 8 (ref 5–15)
BUN: 22 mg/dL (ref 8–23)
CO2: 26 mmol/L (ref 22–32)
Calcium: 9.1 mg/dL (ref 8.9–10.3)
Chloride: 107 mmol/L (ref 98–111)
Creatinine, Ser: 1.46 mg/dL — ABNORMAL HIGH (ref 0.61–1.24)
GFR, Estimated: 50 mL/min — ABNORMAL LOW (ref >60)
Glucose, Bld: 104 mg/dL — ABNORMAL HIGH (ref 70–99)
Potassium: 3.3 mmol/L — ABNORMAL LOW (ref 3.5–5.1)
Sodium: 141 mmol/L (ref 135–145)
Total Bilirubin: 1 mg/dL (ref 0.0–1.2)
Total Protein: 7.4 g/dL (ref 6.5–8.1)

## 2024-04-16 LAB — MAGNESIUM: Magnesium: 2.2 mg/dL (ref 1.7–2.4)

## 2024-04-16 LAB — PHOSPHORUS: Phosphorus: 3.2 mg/dL (ref 2.5–4.6)

## 2024-04-16 LAB — CK: Total CK: 163 U/L (ref 49–397)

## 2024-04-16 MED ORDER — POTASSIUM CHLORIDE CRYS ER 20 MEQ PO TBCR
40.0000 meq | EXTENDED_RELEASE_TABLET | Freq: Once | ORAL | Status: AC
  Administered 2024-04-16: 40 meq via ORAL
  Filled 2024-04-16: qty 2

## 2024-04-16 MED ORDER — ONDANSETRON HCL 4 MG/2ML IJ SOLN: 4.0000 mg | Freq: Three times a day (TID) | INTRAMUSCULAR | Status: DC | PRN

## 2024-04-16 MED ORDER — ROSUVASTATIN CALCIUM 20 MG PO TABS
20.0000 mg | ORAL_TABLET | Freq: Every day | ORAL | Status: DC
  Administered 2024-04-17 – 2024-04-20 (×4): 20 mg via ORAL
  Filled 2024-04-16 (×4): qty 1

## 2024-04-16 MED ORDER — SODIUM CHLORIDE 0.9 % IV BOLUS
500.0000 mL | Freq: Once | INTRAVENOUS | Status: AC
  Administered 2024-04-16: 500 mL via INTRAVENOUS

## 2024-04-16 MED ORDER — ACETAMINOPHEN 325 MG PO TABS
650.0000 mg | ORAL_TABLET | Freq: Four times a day (QID) | ORAL | Status: DC | PRN
  Administered 2024-04-17 – 2024-04-19 (×6): 650 mg via ORAL
  Filled 2024-04-16 (×6): qty 2

## 2024-04-16 MED ORDER — HYDRALAZINE HCL 20 MG/ML IJ SOLN
10.0000 mg | INTRAMUSCULAR | Status: DC | PRN
  Administered 2024-04-16 – 2024-04-17 (×2): 10 mg via INTRAVENOUS
  Filled 2024-04-16 (×2): qty 1

## 2024-04-16 MED ORDER — MECLIZINE HCL 25 MG PO TABS: 25.0000 mg | ORAL_TABLET | Freq: Three times a day (TID) | ORAL | Status: DC | PRN

## 2024-04-16 MED ORDER — GABAPENTIN 100 MG PO CAPS
100.0000 mg | ORAL_CAPSULE | Freq: Every day | ORAL | Status: DC
  Administered 2024-04-17 – 2024-04-19 (×4): 100 mg via ORAL
  Filled 2024-04-16 (×4): qty 1

## 2024-04-16 MED ORDER — LABETALOL HCL 100 MG PO TABS
100.0000 mg | ORAL_TABLET | Freq: Two times a day (BID) | ORAL | Status: DC
  Administered 2024-04-17 – 2024-04-20 (×7): 100 mg via ORAL
  Filled 2024-04-16 (×8): qty 1

## 2024-04-16 MED ORDER — CLOPIDOGREL BISULFATE 75 MG PO TABS
75.0000 mg | ORAL_TABLET | Freq: Every day | ORAL | Status: DC
  Administered 2024-04-17 – 2024-04-20 (×4): 75 mg via ORAL
  Filled 2024-04-16 (×4): qty 1

## 2024-04-16 MED ORDER — CITALOPRAM HYDROBROMIDE 20 MG PO TABS
20.0000 mg | ORAL_TABLET | Freq: Every day | ORAL | Status: DC
  Administered 2024-04-17 – 2024-04-20 (×4): 20 mg via ORAL
  Filled 2024-04-16 (×4): qty 1

## 2024-04-16 MED ORDER — ENOXAPARIN SODIUM 40 MG/0.4ML IJ SOSY
40.0000 mg | PREFILLED_SYRINGE | INTRAMUSCULAR | Status: DC
  Administered 2024-04-16 – 2024-04-19 (×4): 40 mg via SUBCUTANEOUS
  Filled 2024-04-16 (×4): qty 0.4

## 2024-04-16 MED ORDER — IOHEXOL 350 MG/ML SOLN
75.0000 mL | Freq: Once | INTRAVENOUS | Status: AC | PRN
  Administered 2024-04-16: 75 mL via INTRAVENOUS

## 2024-04-16 MED ORDER — HYDRALAZINE HCL 50 MG PO TABS
50.0000 mg | ORAL_TABLET | Freq: Three times a day (TID) | ORAL | Status: DC
  Administered 2024-04-17: 50 mg via ORAL
  Filled 2024-04-16: qty 1

## 2024-04-16 MED ORDER — HYDRALAZINE HCL 20 MG/ML IJ SOLN: 5.0000 mg | INTRAMUSCULAR | Status: DC | PRN

## 2024-04-16 NOTE — ED Triage Notes (Addendum)
 Pt to Ed via [facility] from home. Pt reports weakness, dizziness upon standing and feeling off since having heart in March. Pt is on blood thinner. Pt denies CP or SOB. Pt reports has meclizine  at home but has not taken it. Pt reports multiple falls over the last month.
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
## 2024-04-16 NOTE — ED Provider Notes (Signed)
 [facility] Provider Note    Event Date/Time   First MD Initiated Contact with Patient 04/16/24 1746     (approximate)   History   Dizziness   HPI  Joe Patton. is a 78 y.o. male who comes in for a fall yesterday.  Patient reports dizziness and confusion patient has a history of a stent put in at [facility].  Denies any chest pain or shortness of breath but does have meclizine  at home.  Reports multiple falls over the last month.  Family noticed a progressive decline over the past 2 weeks with these recurrent episodes of dizziness.  Denies any dizziness at rest but he reports that it kind of comes and goes.  They report some memory issues have been more ongoing but this difficulties with ambulation dizziness seem to be more acute.  Physical Exam   Triage Vital Signs: ED Triage Vitals  Encounter Vitals Group     BP 04/16/24 1521 (!) 152/106     Girls Systolic BP Percentile --      Girls Diastolic BP Percentile --      Boys Systolic BP Percentile --      Boys Diastolic BP Percentile --      Pulse Rate 04/16/24 1521 72     Resp 04/16/24 1521 18     Temp 04/16/24 1521 98.6 F (37 C)     Temp Source 04/16/24 1521 Oral     SpO2 04/16/24 1521 100 %     Weight 04/16/24 1803 220 lb 0.3 oz (99.8 kg)     Height 04/16/24 1803 6' 2 (1.88 m)     Head Circumference --      Peak Flow --      Pain Score 04/16/24 1521 0     Pain Loc --      Pain Education --      Exclude from Growth Chart --     Most recent vital signs: Vitals:   04/16/24 1738 04/16/24 1800  BP: (!) 162/100 (!) 198/99  Pulse: (!) 58 60  Resp: 16 16  Temp:    SpO2: 97% 97%     General: Awake, no distress.  CV:  Good peripheral perfusion.  Resp:  Normal effort.  Abd:  No distention.  Soft nontender Other:  Finger-to-nose intact bilaterally.  Cranial nerves appear intact.   ED Results / Procedures / Treatments   Labs (all labs ordered are listed, but only abnormal results are  displayed) Labs Reviewed  COMPREHENSIVE METABOLIC PANEL WITH GFR - Abnormal; Notable for the following components:      Result Value   Potassium 3.3 (*)    Glucose, Bld 104 (*)    Creatinine, Ser 1.46 (*)    GFR, Estimated 50 (*)    All other components within normal limits  TROPONIN I (HIGH SENSITIVITY) - Abnormal; Notable for the following components:   Troponin I (High Sensitivity) 57 (*)    All other components within normal limits  CBC  URINALYSIS, ROUTINE W REFLEX MICROSCOPIC  CBG MONITORING, ED  TROPONIN I (HIGH SENSITIVITY)     EKG  My interpretation of EKG:  Sinus rate of 69 without any ST elevation, T wave inversions in V3 through V6, 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  RADIOLOGY I have reviewed the CT personally and interpreted no intracranial hemorrhage   PROCEDURES:  Critical Care performed: No  .1-3 Lead EKG Interpretation  Performed by: Ernest Ronal BRAVO, MD Authorized  by: Ernest Ronal BRAVO, MD     Interpretation: normal     ECG rate:  60   ECG rate assessment: normal     Rhythm: sinus rhythm     Ectopy: none     Conduction: normal      MEDICATIONS ORDERED IN ED: Medications  potassium chloride  SA (KLOR-CON  M) CR tablet 40 mEq (has no administration in time range)  ondansetron  (ZOFRAN ) injection 4 mg (has no administration in time range)  hydrALAZINE  (APRESOLINE ) injection 5 mg (has no administration in time range)  acetaminophen  (TYLENOL ) tablet 650 mg (has no administration in time range)  iohexol  (OMNIPAQUE ) 350 MG/ML injection 75 mL (has no administration in time range)  sodium chloride  0.9 % bolus 500 mL (500 mLs Intravenous New Bag/Given 04/16/24 1924)     IMPRESSION / MDM / ASSESSMENT AND PLAN / ED COURSE  I reviewed the triage vital signs and the nursing notes.   Patient's presentation is most consistent with acute presentation with potential threat to life or bodily function.   Patient comes in with current dizziness,  confusion.  CT imaging without evidence of intracranial hemorrhage, ACS.  IMPRESSION: 1. Negative for acute intracranial hemorrhage or mass. 2. Atrophy and extensive white matter disease, possible chronic small vessel change. Suspect chronic lacunar infarcts in the basal ganglia. 3. Small anterior interhemispheric hyperdensity, question small aneurysm. Suggest CTA for further evaluation.  Troponin is elevated at 57.  His CMP shows slight elevation of creatinine at 1.46.  CBC reassuring  Orthostatics negative  Pt with hypertension noted.   Patient really did not have any dizziness right now with standing I wonder if this could be some hypertensive urgency with his AKI and elevated blood pressure I will order the CTA just to ensure there is nothing that is causing intermittent dizziness and patient can be admitted to the hospitalist for further workup and management. May need MRI to evalute for confusion/dizziness.  The patient is on the cardiac monitor to evaluate for evidence of arrhythmia and/or significant heart rate changes.      FINAL CLINICAL IMPRESSION(S) / ED DIAGNOSES   Final diagnoses:  Dizziness  Hypertensive urgency  AKI (acute kidney injury) (HCC)     Rx / DC Orders   ED Discharge Orders     None        Note:  This document was prepared using Dragon voice recognition software and may include unintentional dictation errors.   Ernest Ronal BRAVO, MD 04/16/24 2014

## 2024-04-16 NOTE — ED Triage Notes (Signed)
 ARrives via [facility] from home.  C/O gen weakness, dizziness when standing. Recent heart surgery.  VS wnl

## 2024-04-16 NOTE — ED Notes (Signed)
 Pt back from CT

## 2024-04-16 NOTE — Telephone Encounter (Signed)
 Spoke to patient told him that he would need to come in for his appointment today. Pt stated that he can not walk or come in and the only way he could do an appointment is a virtually. Told pt that he would still need to come in. Pt stated that his son was going to come over there to set up his virtual appointment for him that he wanted to have the appointment virtually and not come in. Pt stated I don't know what to tell you but I can't come in the office. Wife was in the background during the phone call. Pt asked me do you know Rexene? She was Dr. Joshua pt. I told him I knew who she was.He asked what her new office number was so he could reach out to her. Told pt I did not know her number. Told pt he could call his insurance for transportation. Pt stated he didn't want to go thru that he would call EMS to take him to the hospital. Viewed pts chart seen where he went to a office visit 2 weeks ago at another providers office. Pt confirmed that he went to that appointment but now 2 weeks later can not come in due to him not being able to walk and that he was unstable.Told pt if he can't walk he needs to go to the ER to be seen. Pt insisted that he wanted the appointment to be virtual and that someone told him he could be seen. Told pt our answering service is who he talked to and I apologized for any inconvenience that this may have cause. Told pt I would need to cancel the appointment if he could not come into the office for a visit. Told pt to call back to schedule an appointment when he could come in the office to be seen. Explained to the pt when he was a patient of Dr. Joshua she did not do virtual visits. Told pt this is a appointment to see a new provider since Dr. Joshua is no longer here. Told pt the appointment was cancelled. Pt verbalized understanding.  KP

## 2024-04-16 NOTE — Telephone Encounter (Signed)
-----   Message from Rolan SHAUNNA Hoyle sent at 04/16/2024 10:09 AM EDT ----- E2C2 scheduled a virtual visit for transfer of care at 4 today. Please call patient and inform that we do not do virtual visits for the first visit with a new provider. He can keep the same appointment slot but it needs to be in-Joe Patton. Alternatively, we can reschedule for another day.

## 2024-04-16 NOTE — Telephone Encounter (Signed)
 Copied from CRM [id number]. Topic: Clinical - Medical Advice >> Apr 16, 2024 11:14 AM Myrick T wrote: Reason for CRM: patients son called stated his father is suffering from confusion, having the shakes and is no longer mobile. He wanted to know why his virtual appt was cancelled and per CAL it was due to seeing a different provider he would need to come in [facility]ie Lopes. Son said since his dad is no longer mobile they would need EMS to bring him in for an in Shakiya Mcneary visit. Please f/u with son to discuss what steps to take next.   ----------------------------------------------------------------------- From previous Reason for Contact - Scheduling: Patient/patient representative is calling to schedule an appointment. Refer to attachments for appointment information.

## 2024-04-16 NOTE — ED Provider Triage Note (Signed)
 Emergency Medicine Provider Triage Evaluation Note  [facility] Stryker Veasey. , a 78 y.o. male  was evaluated in triage.  Pt complains of dizziness. Had heart surgery in march and has had progressive symptoms since then.  Review of Systems  Positive: dizziness Negative:   Physical Exam  BP (!) 152/106   Pulse 72   Temp 98.6 F (37 C) (Oral)   Resp 18   SpO2 100%  Gen:   Awake, no distress   Resp:  Normal effort  MSK:   Moves extremities without difficulty  Other:   Medical Decision Making  Medically screening exam initiated at 3:35 PM.  Appropriate orders placed.  Ishan [employer]. was informed that the remainder of the evaluation will be completed by another provider, this initial triage assessment does not replace that evaluation, and the importance of remaining in the ED until their evaluation is complete.     Cleaster Tinnie LABOR, PA-C 04/16/24 1536
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
## 2024-04-16 NOTE — Telephone Encounter (Signed)
 Called son let him know that if the pt is note mobile and can't walk. Based off his symptoms he needs to be seen in the ED. He verbalized understanding and stated  he will get the ball rolling on this.  KP

## 2024-04-16 NOTE — ED Notes (Signed)
 Pt reports falling at home yesterday when trying to make it to the sofa. Pt denies any injury related to fall. Pt reports dizziness has been present since yesterday. More frequent confusion over the past few weeks reported by pt spouse. Hx of stent placement at [facility].

## 2024-04-17 ENCOUNTER — Observation Stay

## 2024-04-17 DIAGNOSIS — I130 Hypertensive heart and chronic kidney disease with heart failure and stage 1 through stage 4 chronic kidney disease, or unspecified chronic kidney disease: Secondary | ICD-10-CM | POA: Diagnosis present

## 2024-04-17 DIAGNOSIS — I6782 Cerebral ischemia: Secondary | ICD-10-CM | POA: Diagnosis not present

## 2024-04-17 DIAGNOSIS — Z66 Do not resuscitate: Secondary | ICD-10-CM | POA: Diagnosis present

## 2024-04-17 DIAGNOSIS — M797 Fibromyalgia: Secondary | ICD-10-CM | POA: Diagnosis present

## 2024-04-17 DIAGNOSIS — Z8673 Personal history of transient ischemic attack (TIA), and cerebral infarction without residual deficits: Secondary | ICD-10-CM | POA: Diagnosis not present

## 2024-04-17 DIAGNOSIS — G629 Polyneuropathy, unspecified: Secondary | ICD-10-CM | POA: Diagnosis present

## 2024-04-17 DIAGNOSIS — R410 Disorientation, unspecified: Secondary | ICD-10-CM | POA: Diagnosis not present

## 2024-04-17 DIAGNOSIS — G8929 Other chronic pain: Secondary | ICD-10-CM | POA: Diagnosis present

## 2024-04-17 DIAGNOSIS — I5032 Chronic diastolic (congestive) heart failure: Secondary | ICD-10-CM | POA: Diagnosis present

## 2024-04-17 DIAGNOSIS — F32A Depression, unspecified: Secondary | ICD-10-CM | POA: Diagnosis present

## 2024-04-17 DIAGNOSIS — I2489 Other forms of acute ischemic heart disease: Secondary | ICD-10-CM | POA: Diagnosis present

## 2024-04-17 DIAGNOSIS — I160 Hypertensive urgency: Secondary | ICD-10-CM | POA: Diagnosis present

## 2024-04-17 DIAGNOSIS — F419 Anxiety disorder, unspecified: Secondary | ICD-10-CM | POA: Diagnosis present

## 2024-04-17 DIAGNOSIS — G319 Degenerative disease of nervous system, unspecified: Secondary | ICD-10-CM | POA: Diagnosis not present

## 2024-04-17 DIAGNOSIS — Y92009 Unspecified place in unspecified non-institutional (private) residence as the place of occurrence of the external cause: Secondary | ICD-10-CM | POA: Diagnosis not present

## 2024-04-17 DIAGNOSIS — N179 Acute kidney failure, unspecified: Secondary | ICD-10-CM | POA: Diagnosis present

## 2024-04-17 DIAGNOSIS — R296 Repeated falls: Secondary | ICD-10-CM | POA: Diagnosis present

## 2024-04-17 DIAGNOSIS — E876 Hypokalemia: Secondary | ICD-10-CM | POA: Diagnosis present

## 2024-04-17 DIAGNOSIS — R9082 White matter disease, unspecified: Secondary | ICD-10-CM | POA: Diagnosis present

## 2024-04-17 DIAGNOSIS — I10 Essential (primary) hypertension: Secondary | ICD-10-CM | POA: Diagnosis not present

## 2024-04-17 DIAGNOSIS — W19XXXA Unspecified fall, initial encounter: Secondary | ICD-10-CM | POA: Diagnosis present

## 2024-04-17 DIAGNOSIS — N400 Enlarged prostate without lower urinary tract symptoms: Secondary | ICD-10-CM | POA: Diagnosis present

## 2024-04-17 DIAGNOSIS — R42 Dizziness and giddiness: Secondary | ICD-10-CM

## 2024-04-17 DIAGNOSIS — I674 Hypertensive encephalopathy: Secondary | ICD-10-CM | POA: Diagnosis not present

## 2024-04-17 DIAGNOSIS — R569 Unspecified convulsions: Secondary | ICD-10-CM | POA: Diagnosis not present

## 2024-04-17 DIAGNOSIS — I161 Hypertensive emergency: Secondary | ICD-10-CM | POA: Diagnosis not present

## 2024-04-17 DIAGNOSIS — N182 Chronic kidney disease, stage 2 (mild): Secondary | ICD-10-CM | POA: Diagnosis present

## 2024-04-17 DIAGNOSIS — M160 Bilateral primary osteoarthritis of hip: Secondary | ICD-10-CM | POA: Diagnosis present

## 2024-04-17 DIAGNOSIS — I2510 Atherosclerotic heart disease of native coronary artery without angina pectoris: Secondary | ICD-10-CM | POA: Diagnosis not present

## 2024-04-17 DIAGNOSIS — G4733 Obstructive sleep apnea (adult) (pediatric): Secondary | ICD-10-CM | POA: Diagnosis present

## 2024-04-17 DIAGNOSIS — I951 Orthostatic hypotension: Secondary | ICD-10-CM | POA: Diagnosis present

## 2024-04-17 DIAGNOSIS — R531 Weakness: Secondary | ICD-10-CM | POA: Diagnosis present

## 2024-04-17 DIAGNOSIS — M25512 Pain in left shoulder: Secondary | ICD-10-CM | POA: Diagnosis present

## 2024-04-17 DIAGNOSIS — I6783 Posterior reversible encephalopathy syndrome: Secondary | ICD-10-CM | POA: Diagnosis not present

## 2024-04-17 DIAGNOSIS — E785 Hyperlipidemia, unspecified: Secondary | ICD-10-CM | POA: Diagnosis present

## 2024-04-17 DIAGNOSIS — M170 Bilateral primary osteoarthritis of knee: Secondary | ICD-10-CM | POA: Diagnosis present

## 2024-04-17 LAB — CBC
HCT: 41.9 % (ref 39.0–52.0)
Hemoglobin: 13.7 g/dL (ref 13.0–17.0)
MCH: 27.5 pg (ref 26.0–34.0)
MCHC: 32.7 g/dL (ref 30.0–36.0)
MCV: 84 fL (ref 80.0–100.0)
Platelets: 206 K/uL (ref 150–400)
RBC: 4.99 MIL/uL (ref 4.22–5.81)
RDW: 14.7 % (ref 11.5–15.5)
WBC: 9.1 K/uL (ref 4.0–10.5)
nRBC: 0 % (ref 0.0–0.2)

## 2024-04-17 LAB — BASIC METABOLIC PANEL WITH GFR
Anion gap: 9 (ref 5–15)
BUN: 19 mg/dL (ref 8–23)
CO2: 24 mmol/L (ref 22–32)
Calcium: 8.9 mg/dL (ref 8.9–10.3)
Chloride: 108 mmol/L (ref 98–111)
Creatinine, Ser: 1.17 mg/dL (ref 0.61–1.24)
GFR, Estimated: >60 mL/min (ref >60)
Glucose, Bld: 105 mg/dL — ABNORMAL HIGH (ref 70–99)
Potassium: 3.2 mmol/L — ABNORMAL LOW (ref 3.5–5.1)
Sodium: 141 mmol/L (ref 135–145)

## 2024-04-17 LAB — PROTIME-INR
INR: 1.1 (ref 0.8–1.2)
Prothrombin Time: 15.1 s (ref 11.4–15.2)

## 2024-04-17 LAB — APTT: aPTT: 30 s (ref 24–36)

## 2024-04-17 LAB — VITAMIN B12: Vitamin B-12: 240 pg/mL (ref 180–914)

## 2024-04-17 MED ORDER — HYDRALAZINE HCL 50 MG PO TABS
50.0000 mg | ORAL_TABLET | Freq: Three times a day (TID) | ORAL | Status: DC
  Administered 2024-04-17 – 2024-04-20 (×9): 50 mg via ORAL
  Filled 2024-04-17 (×9): qty 1

## 2024-04-17 MED ORDER — POTASSIUM CHLORIDE CRYS ER 20 MEQ PO TBCR
40.0000 meq | EXTENDED_RELEASE_TABLET | Freq: Once | ORAL | Status: AC
  Administered 2024-04-17: 40 meq via ORAL
  Filled 2024-04-17: qty 2

## 2024-04-17 MED ORDER — LOSARTAN POTASSIUM 50 MG PO TABS
100.0000 mg | ORAL_TABLET | Freq: Every day | ORAL | Status: DC
  Administered 2024-04-17 – 2024-04-20 (×4): 100 mg via ORAL
  Filled 2024-04-17 (×4): qty 2

## 2024-04-17 MED ORDER — MELATONIN 5 MG PO TABS
5.0000 mg | ORAL_TABLET | Freq: Every day | ORAL | Status: DC
  Administered 2024-04-17 – 2024-04-19 (×4): 5 mg via ORAL
  Filled 2024-04-17 (×4): qty 1

## 2024-04-17 NOTE — Consult Note (Signed)
 [facility] CLINIC CARDIOLOGY CONSULT NOTE       Patient ID: Joe Patton. MRN: [id number] DOB/AGE: [birth year] 78 y.o.  Admit date: 04/16/2024 Referring Physician Dr. Barbarann Primary Physician Joe Cathryne BROCKS, MD (Inactive) Primary Cardiologist Dr. Florencio Reason for Consultation AMS  HPI: Joe Patton. is a 78 y.o. 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 Cardiology was consulted for further evaluation.   Patient presented to the ED with lightheadedness, generalized weakness and fall. Work up in the ED notable for sodium 141, potassium 3.3, magnesium 2.2, creatinine 1.46, hemoglobin 13.8, platelets 211.  CK within normal limits.  LFTs within normal limits.  CTA head and neck with no large vessel occlusion or proximal hemodynamically significant stenosis.  CTA head negative for acute intracranial hemorrhage or mass.  MRI brain with no evidence of acute intracranial abnormality.  Trops mildly elevated and flat 57 > 57. EKG with sinus rhythm, PAC, RBBB, rate 69 bpm without acute ischemic changes.  BP was found to be elevated.  Patient received 2 doses of IV hydralazine  10 mg and potassium repleted.  At the time of my evaluation this afternoon, patient was resting comfortably in ED stretcher with family at bedside.  Patient states he is fallen a few times in the last 2 months.  Patient states he was shopping and walking with a cart and suddenly felt lightheaded, generalized weakness that led him to fall without LOC.  Patient states his most recent fall episode he had mild shortness of breath associated with it.  Patient denies any  chest pain, palpitations or syncope.  Patient also reports he has lost at least 20 pounds in the last 2 months without any changes to his appetite.  Patient's wife reports that patient has been having significant increase in brain fog for the past 2 weeks.  Pertinent Cardiac History (Most recent)  CATH PCI (12/25/23) 1. US -guided R CFA (46F), L CFA (37F), and L FV (41F) access.  2. Successful orbital atherectomy of the proximal to distal RCA and RPLA with a 1.25 mm CSI.  3. Successful IVUS-guided PCI to the prox-distal RCA/RPLA with 3 DES, post-dilated to 4.5 mm.      - RPLA: 3.0 x 22 mm Onyx Frontier DES, post-dilated to 3.9 mm.      - Distal RCA: 3.5 x 30 mm Onyx Frontier DES, post-dilated to 4.5 mm proximally.      - Prox-distal RCA: 4.0 x 48 mm Synergy DES, post-dilated to 4.5 mm.   Review of systems complete and found to be negative unless listed above    Past Medical History:  Diagnosis Date   Actinic keratosis    Allergic rhinitis    Anxiety    Arthritis    Bilirubinuria    BPH (benign prostatic hyperplasia)    Degeneration, intervertebral disc, lumbosacral    Erectile dysfunction    Eustachian tube dysfunction    Fibromyalgia    GERD (gastroesophageal reflux disease)    OTC if needed Pepcid    Heart murmur    Hypertension    Osteoarthrosis    Sinusitis    Sleep apnea    uses CPAP, will bring mask   Viral  warts     Past Surgical History:  Procedure Laterality Date   CATARACT EXTRACTION     right   CHOLECYSTECTOMY  2005   COLONOSCOPY  2014   cleared for 10 yrs- Dr Jinny   CYSTOSCOPY WITH LITHOLAPAXY N/A 02/15/2021   Procedure: CYSTOSCOPY WITH LITHOLAPAXY;  Surgeon: Twylla Glendia BROCKS, MD;  Location: [facility] ORS;  Service: Urology;  Laterality: N/A;   EYE SURGERY Right    GALLBLADDER SURGERY     HIP SURGERY     left   JOINT REPLACEMENT     right and left total hips   LEFT HEART CATH AND CORONARY ANGIOGRAPHY Left 11/14/2023   Procedure: LEFT HEART CATH AND CORONARY  ANGIOGRAPHY;  Surgeon: Joe Patton Cara BIRCH, MD;  Location: [facility] INVASIVE CV LAB;  Service: Cardiovascular;  Laterality: Left;   TONSILLECTOMY     TOTAL SHOULDER ARTHROPLASTY Left 09/03/2014   Procedure: TOTAL SHOULDER ARTHROPLASTY;  Surgeon: Eva Elsie Herring, MD;  Location: [facility] OR;  Service: Orthopedics;  Laterality: Left;    (Not in a hospital admission)  Social History   Socioeconomic History   Marital status: Married    Spouse name: Not on file   Number of children: 1   Years of education: Not on file   Highest education level: Not on file  Occupational History   Occupation: retired  Tobacco Use   Smoking status: Former    Current packs/day: 0.00    Average packs/day: 1.5 packs/day for 20.0 years (30.0 ttl pk-yrs)    Types: Cigarettes    Start date: 10/03/1987    Quit date: 10/03/2007    Years since quitting: 16.5   Smokeless tobacco: Never   Tobacco comments:    Has not resumed smoking. No need to provide pt with smoking cessation materials  Vaping Use   Vaping status: Never Used  Substance and Sexual Activity   Alcohol use: No   Drug use: No   Sexual activity: Yes  Other Topics Concern   Not on file  Social History Narrative   Not on file   Social Drivers of Health   Financial Resource Strain: Low Risk  (01/22/2024)   Received from [facility]   Overall Financial Resource Strain (CARDIA)    Difficulty of Paying Living Expenses: Not hard at all  Food Insecurity: No Food Insecurity (01/22/2024)   Received from [facility]   Hunger Vital Sign    Within the past 12 months, you worried that your food would run out before you got the money to buy more.: Never true    Within the past 12 months, the food you bought just didn't last and you didn't have money to get more.: Never true  Transportation Needs: No Transportation Needs (01/22/2024)   Received from [facility] - Transportation    In the past 12  months, has lack of transportation kept you from medical appointments or from getting medications?: No    Lack of Transportation (Non-Medical): No  Physical Activity: Insufficiently Active (02/15/2022)   Exercise Vital Sign    Days of Exercise per Week: 7 days    Minutes of Exercise per Session: 20 min  Stress: No Stress Concern Present (02/15/2022)   [employer] of Occupational Health - Occupational Stress Questionnaire    Feeling of Stress : Not at all  Social Connections: Socially Integrated (02/15/2022)   Social Connection and Isolation Panel    Frequency of Communication with Friends and Family:  More than three times a week    Frequency of Social Gatherings with Friends and Family: More than three times a week    Attends Religious Services: More than 4 times per year    Active Member of Clubs or Organizations: Yes    Attends Banker Meetings: More than 4 times per year    Marital Status: Married  Catering manager Violence: Not At Risk (02/15/2022)   Humiliation, Afraid, Rape, and Kick questionnaire    Fear of Current or Ex-Partner: No    Emotionally Abused: No    Physically Abused: No    Sexually Abused: No    Family History  Problem Relation Age of Onset   Breast cancer Mother    Hypertension Father    Heart attack Father    Stroke Father      Vitals:   04/17/24 0500 04/17/24 0530 04/17/24 0946 04/17/24 1230  BP: (!) 173/97 (!) 176/90 (!) 200/119 (!) 180/111  Pulse: 67 71 74 65  Resp: 16 16 16 14   Temp:   98 F (36.7 C)   TempSrc:      SpO2: 98% 99% 99% 98%  Weight:      Height:        PHYSICAL EXAM General: Well-appearing elderly male, well nourished, in no acute distress. HEENT: Normocephalic and atraumatic. Neck: No JVD.   Lungs: Normal respiratory effort on room air. Clear bilaterally to auscultation. No wheezes, crackles, rhonchi.  Heart: HRRR. Normal S1 and S2 without gallops or murmurs.  Abdomen: Non-distended appearing.  Msk: Normal  strength and tone for age. Extremities: Warm and well perfused. No clubbing, cyanosis, edema.  Neuro: Alert and oriented X 3. Psych: Answers questions appropriately.   Labs: Basic Metabolic Panel: Recent Labs    04/16/24 1532 04/16/24 1739 04/17/24 0440  NA 141  --  141  K 3.3*  --  3.2*  CL 107  --  108  CO2 26  --  24  GLUCOSE 104*  --  105*  BUN 22  --  19  CREATININE 1.46*  --  1.17  CALCIUM  9.1  --  8.9  MG  --  2.2  --   PHOS  --  3.2  --    Liver Function Tests: Recent Labs    04/16/24 1532  AST 25  ALT 19  ALKPHOS 77  BILITOT 1.0  PROT 7.4  ALBUMIN 4.1   No results for input(s): LIPASE, AMYLASE in the last 72 hours. CBC: Recent Labs    04/16/24 1532 04/17/24 0440  WBC 7.3 9.1  HGB 13.8 13.7  HCT 41.2 41.9  MCV 83.9 84.0  PLT 211 206   Cardiac Enzymes: Recent Labs    04/16/24 1532 04/16/24 1739  CKTOTAL 163  --   TROPONINIHS 57* 57*   BNP: No results for input(s): BNP in the last 72 hours. D-Dimer: No results for input(s): DDIMER in the last 72 hours. Hemoglobin A1C: No results for input(s): HGBA1C in the last 72 hours. Fasting Lipid Panel: No results for input(s): CHOL, HDL, LDLCALC, TRIG, CHOLHDL, LDLDIRECT in the last 72 hours. Thyroid Function Tests: No results for input(s): TSH, T4TOTAL, T3FREE, THYROIDAB in the last 72 hours.  Invalid input(s): FREET3 Anemia Panel: Recent Labs    04/17/24 0440  VITAMINB12 240     Radiology: MR BRAIN WO CONTRAST Result Date: 04/17/2024 CLINICAL DATA:  Syncope/presyncope, cerebrovascular cause suspected EXAM: MRI HEAD WITHOUT CONTRAST TECHNIQUE: Multiplanar, multiecho pulse sequences of the brain and  surrounding structures were obtained without intravenous contrast. COMPARISON:  CT head from April 16, 2024. FINDINGS: Brain: No acute infarction, hemorrhage, hydrocephalus, extra-axial collection or mass lesion. Cerebral atrophy. Moderate patchy T2/FLAIR hyperintensities  the white matter, compatible with chronic microvascular ischemic disease. Multiple small remote lacunar infarcts in the corona radiata and basal ganglia bilaterally. Vascular: Normal flow voids. Skull and upper cervical spine: Normal marrow signal. Sinuses/Orbits: Negative. Other: No mastoid effusions. IMPRESSION: 1. No evidence of acute intracranial abnormality. 2. Moderate chronic microvascular ischemic disease. 3.  Cerebral Atrophy (ICD10-G31.9). Electronically Signed   By: Gilmore GORMAN Molt M.D.   On: 04/17/2024 02:45   CT ANGIO HEAD NECK W WO CM Result Date: 04/16/2024 CLINICAL DATA:  Neuro deficit, acute, stroke suspected EXAM: CT ANGIOGRAPHY HEAD AND NECK WITH AND WITHOUT CONTRAST TECHNIQUE: Multidetector CT imaging of the head and neck was performed using the standard protocol during bolus administration of intravenous contrast. Multiplanar CT image reconstructions and MIPs were obtained to evaluate the vascular anatomy. Carotid stenosis measurements (when applicable) are obtained utilizing NASCET criteria, using the distal internal carotid diameter as the denominator. RADIATION DOSE REDUCTION: This exam was performed according to the departmental dose-optimization program which includes automated exposure control, adjustment of the mA and/or kV according to patient size and/or use of iterative reconstruction technique. CONTRAST:  75mL OMNIPAQUE  IOHEXOL  350 MG/ML SOLN COMPARISON:  Same day CT head. FINDINGS: CTA NECK FINDINGS Aortic arch: Aortic atherosclerosis. Great vessel origins are patent. Right carotid system: No evidence of dissection, stenosis (50% or greater), or occlusion. Left carotid system: No evidence of dissection, stenosis (50% or greater), or occlusion. Vertebral arteries: Left dominant. No evidence of dissection, stenosis (50% or greater), or occlusion. Skeleton: No acute abnormality on limited assessment. Severe multilevel degenerative change. Other neck: No evidence of acute  abnormality on limited assessment. Upper chest: Visualized lung apices are clear. Review of the MIP images confirms the above findings CTA HEAD FINDINGS Anterior circulation: Extensive calcific atherosclerosis of the intracranial ICAs bilaterally with mild stenosis. The MCAs and ACAs are patent without proximal hemodynamically significant stenosis. Posterior circulation: Bilateral intradural vertebral arteries, basilar artery and bilateral posterior rods are patent without proximal hemodynamically significant stenosis. Left fetal type PCA, anatomic variant. Venous sinuses: As permitted by contrast timing, patent. Review of the MIP images confirms the above findings IMPRESSION: No emergent large vessel occlusion or proximal hemodynamically significant stenosis. Electronically Signed   By: Gilmore GORMAN Molt M.D.   On: 04/16/2024 21:48   CT HEAD WO CONTRAST Result Date: 04/16/2024 CLINICAL DATA:  Dizziness weakness EXAM: CT HEAD WITHOUT CONTRAST TECHNIQUE: Contiguous axial images were obtained from the base of the skull through the vertex without intravenous contrast. RADIATION DOSE REDUCTION: This exam was performed according to the departmental dose-optimization program which includes automated exposure control, adjustment of the mA and/or kV according to patient size and/or use of iterative reconstruction technique. COMPARISON:  None Available. FINDINGS: Brain: No acute territorial infarction, hemorrhage or intracranial mass. Mild to moderate atrophy. Extensive white matter hypodensity. Probable small chronic lacunar infarcts in the basal ganglia. Mild ventricular enlargement, likely due to atrophy. Suspect some degree of ex vacuo enlargement of the left lateral ventricle. Vascular: Carotid and vertebral vascular calcification. Small anterior interhemispheric hyperdensity series 2, image 18, coronal series 4, image 19, sagittal series 5, image 29. Some calcification in the region. This measures 3 mm on sagittal  images and is slightly bulbous in configuration Skull: Normal. Negative for fracture or focal lesion. Sinuses/Orbits: No acute finding. Other:  None IMPRESSION: 1. Negative for acute intracranial hemorrhage or mass. 2. Atrophy and extensive white matter disease, possible chronic small vessel change. Suspect chronic lacunar infarcts in the basal ganglia. 3. Small anterior interhemispheric hyperdensity, question small aneurysm. Suggest CTA for further evaluation. Electronically Signed   By: Luke Bun M.D.   On: 04/16/2024 16:40    ECHO 10/2023 MILD LEFT VENTRICULAR SYSTOLIC DYSFUNCTION WITH MILD LVH  ESTIMATED EF: 50%  NORMAL LA PRESSURES WITH DIASTOLIC DYSFUNCTION (GRADE 1)  NORMAL RIGHT VENTRICULAR SYSTOLIC FUNCTION  VALVULAR REGURGITATION: MILD AR, TRIVIAL MR, No PR, TRIVIAL TR  NO VALVULAR STENOSIS   TELEMETRY reviewed by me 04/17/2024: Sinus rhythm, rate 60s  EKG reviewed by me: .  Trops mildly elevated and flat 57 > 57. EKG with sinus rhythm, PAC, RBBB, rate 69 bpm without acute ischemic changes.  Data reviewed by me 04/17/2024: last 24h vitals tele labs imaging I/O ED provider note, admission H&P.  Principal Problem:   Dizziness Active Problems:   OSA (obstructive sleep apnea)   CAD (coronary artery disease)   Depression with anxiety   Acute renal failure superimposed on stage 2 chronic kidney disease (HCC)   Overweight (BMI 25.0-29.9)   Fall at home, initial encounter   Myocardial injury   Hypertensive urgency   Hypokalemia   Peripheral neuropathy    ASSESSMENT AND PLAN:  Aariz P Filemon Breton. is a 78 y.o. 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 Cardiology was consulted for further evaluation.   # CAD s/p PCI to RCA orbital atherectomy and stenting of the prox-distal  (12/25/23) # Hypertension # Hyperlipidemia # Demand ischemia, secondary to hypertensive urgency  # AKI  Patient without chest pain. Trops mildly elevated and flat 57 > 57. EKG with sinus rhythm, PAC, RBBB, rate 69 bpm without acute ischemic changes.  Upon admission BP elevated, patient has received 2X IV hydralazine  and Cr was elevated, now improved.  -Continue Plavix  75 mg daily, rosuvastatin  20 mg daily. -Continue home labetalol  100 mg twice daily.  -Continue hydralazine  50 mg 3 times daily. -Resume home losartan  100 mg daily. SBP improved to 130s s/p 1x PO losartan    # Dizziness, generalized weakness # Falls Patient presents with a few falls with associated lightheadedness and weakness. Most recent fall associated with mild shortness of breath, without chest pain. Also recent 20lb weight loss within the past 2 months, with normal appetite. CTA negative for any significant stenosis or occlusion.  -Echo ordered. -Will consider cardiac monitor prior to discharge for further evaluation. -Recommend PT.   This patient's plan of care was discussed and created with Dr. Florencio and he is in agreement.  Signed: Dorene Comfort, PA-C  04/17/2024, 12:36 PM [facility] Cardiology

## 2024-04-17 NOTE — ED Notes (Signed)
Transported to MRI at this time

## 2024-04-17 NOTE — Progress Notes (Addendum)
 Progress Note   Patient: [facility] Dalyn Becker. FMW:[id number] DOB: Apr 08, 1946 DOA: 04/16/2024     0 DOS: the patient was seen and examined on 04/17/2024   Brief hospital course: 77yo with h/o HTN, CAD s/p recent stent, depression/anxiety, BPH, OSA on CPAP, and fibromyalgia who presented on 7/16 with dizziness and a fall. Negative head CT, CTA, and MRI of brain. Concern for hypertensive urgency, BP slowly improving.  Patient and wife report generalized brain fog with progressive weakness since complicated cath with stents in March.  Assessment and Plan:  Dizziness Etiology is not clear, possibly orthostatic in nature CT head is negative for acute intracranial abnormalities, but that showed questionable small aneurysm CTA of head and neck was negative for LVO or stenosis MRI performed with no acute disease but moderate chronic microvascular ischemic disease Will continue evaluation as inpatient on telemetry  As needed meclizine  Orthostatics were checked while BP was exceedingly elevated; will check again now and in AM   Fall at home, initial encounter Patient and wife report that he has been having increasingly progressive weakness for several months, culminating in a fall  PT/OT consulted Fall precautions   Hypertensive urgency Blood pressure 220/105 on presentation While this was exceedingly elevated, his wife reports that this was only present in the 24 hours PTA and has not been an issue at home over the last few months (and so is less likely related to progressive weakness) IV hydralazine  as needed Continue home amlodipine  and labetalol  Hold Cozaar  due to worsening renal function Add oral hydralazine  50 mg 3 times daily   CAD (coronary artery disease)  Trop 57 --> 57, negative delta No chest pain, likely demand ischemia secondary to hypertensive urgency Continue Aspirin , Plavix  Continue Crestor  Patient and wife are specific that issues started after cath in March Will consult  cardiology   Acute renal failure superimposed on stage 2 chronic kidney disease  Recent baseline creatinine 1.14 on 11/14/2023 Presenting creatinine 1.46 Given IV fluids x 500 cc normal saline in the ED Renal function is back to baseline today Continue to hold Cozaar  Avoid nephrotoxic medications   Hypokalemia Repleting   Peripheral neuropathy New numbness and tingling in both lower legs and left hand He does have some back pain, ?related Normal vitamin B12 level Empiric gabapentin  100 mg at night   OSA (obstructive sleep apnea) Continue CPAP   Depression with anxiety Continue Celexa    DNR I have discussed code status with the patient/family and they are in agreement that the patient would not desire resuscitation and would prefer to die a natural death should that situation arise. Patient will need a gold out of facility DNR form at the time of discharge         Consultants: Cardiology PT OT   Procedures: None   Antibiotics: None      Subjective: Patient and wife provided history.  He had complicated cath with stents in March and has had progressive decline since.  He is far less mobile, not driving anymore, and generally failing.  He has had progressive weakness with vertigo that happens with changes in position and also a fall.  BPs were markedly uncontrolled yesterday, but this has not been an issue at home prior and was really not their greatest concern.  He has been so miserable that he has actually been considering whether it is time to go.  Physical Exam: Vitals:   04/17/24 0500 04/17/24 0530 04/17/24 0946 04/17/24 1230  BP: (!) 173/97 (!) 176/90 ROLLEN)  200/119 (!) 180/111  Pulse: 67 71 74 65  Resp: 16 16 16 14   Temp:   98 F (36.7 C)   TempSrc:      SpO2: 98% 99% 99% 98%  Weight:      Height:       No intake or output data in the 24 hours ending 04/17/24 1409 Filed Weights   04/16/24 1803  Weight: 99.8 kg    Exam:  General:  Appears calm  and comfortable and is in NAD Eyes:  normal lids, iris ENT:  grossly normal hearing, lips & tongue, mmm; poor/absent dentition Cardiovascular:  RRR, no m/r/g. No LE edema.  Respiratory:   CTA bilaterally with no wheezes/rales/rhonchi.  Normal respiratory effort. Abdomen:  soft, NT, ND Skin:  no rash or induration seen on limited exam Musculoskeletal:  grossly normal tone BUE/BLE, good ROM, no bony abnormality Psychiatric:  blunted mood and affect, speech fluent and appropriate, AOx3 Neurologic:  CN 2-12 grossly intact, moves all extremities in coordinated fashion  Data Reviewed: I have reviewed the patient's lab results since admission.  Pertinent labs for today include:   K+ 3.2 Glucose 105 Normal CBC    Family Communication: Wife was present throughout evaluation  Disposition: Status is: Inpatient Remains inpatient appropriate because: ongoing evaluation/treatment  Planned Discharge Destination: Home   Recommendations for discharge: You are being discharged home with home physical and occupational therapy Continue meclizine  as needed for dizziness Oral hydralazine  was added for blood pressure control; continue to take up to 3 times per day for systolic blood pressure >160 Referral placed to neurology regarding dizziness and brain fog Follow up with Dr. Joshua (or other PCP) in 1-2 weeks to discuss ongoing issues as well as weight loss Follow up with cardiology next week for follow up and echocardiogram   Time spent: 50 minutes  Author: Delon Herald, MD 04/17/2024 2:09 PM  For on call review [email or web address].

## 2024-04-17 NOTE — ED Notes (Signed)
 Pt requesting medication to help with sleep. Caleb Exon, MD notified. Melatonin ordered. Currently waiting for pharmacy to verify.

## 2024-04-17 NOTE — Progress Notes (Signed)
 PT Cancellation Note  Patient Details Name: Joe Patton. MRN: [id number] DOB: [birth year]   Cancelled Treatment:    Reason Eval/Treat Not Completed: Other (comment).  PT consult received.  Chart reviewed.  Attempted to see pt this morning but deferred physical therapy session d/t pt's BP 200/119 (MAP 141) with pt resting in bed--nurse present and aware.  Pt's most recent BP in chart noted to be 180/111.  D/t continued elevated BP, will hold PT evaluation at this time.  Will re-attempt at a later date/time as medically appropriate.   Damien Caulk, PT 04/17/24, 1:15 PM
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
## 2024-04-17 NOTE — Progress Notes (Signed)
 Orders only for echocardiogram.  GABRIELLA COMUNALE DECOSTE, PA
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
## 2024-04-18 ENCOUNTER — Inpatient Hospital Stay

## 2024-04-18 ENCOUNTER — Inpatient Hospital Stay: Admit: 2024-04-18 | Discharge: 2024-04-18 | Disposition: A | Discharge status: Home / Self Care

## 2024-04-18 DIAGNOSIS — R569 Unspecified convulsions: Secondary | ICD-10-CM | POA: Diagnosis not present

## 2024-04-18 DIAGNOSIS — I161 Hypertensive emergency: Secondary | ICD-10-CM

## 2024-04-18 DIAGNOSIS — R410 Disorientation, unspecified: Secondary | ICD-10-CM | POA: Diagnosis not present

## 2024-04-18 DIAGNOSIS — I6783 Posterior reversible encephalopathy syndrome: Secondary | ICD-10-CM | POA: Diagnosis not present

## 2024-04-18 DIAGNOSIS — R42 Dizziness and giddiness: Secondary | ICD-10-CM | POA: Diagnosis not present

## 2024-04-18 DIAGNOSIS — I674 Hypertensive encephalopathy: Secondary | ICD-10-CM

## 2024-04-18 DIAGNOSIS — I10 Essential (primary) hypertension: Secondary | ICD-10-CM

## 2024-04-18 LAB — ECHOCARDIOGRAM COMPLETE
AR max vel: 3.47 cm2
AV Area VTI: 3.34 cm2
AV Area mean vel: 3.5 cm2
AV Mean grad: 5 mmHg
AV Peak grad: 9.2 mmHg
Ao pk vel: 1.52 m/s
Area-P 1/2: 2.37 cm2
Calc EF: 55.6 %
Height: 74 in
MV VTI: 4.4 cm2
S' Lateral: 3.2 cm
Single Plane A2C EF: 64.6 %
Single Plane A4C EF: 49.7 %
Weight: 3520.31 [oz_av]

## 2024-04-18 LAB — CBC WITH DIFFERENTIAL/PLATELET
Abs Immature Granulocytes: 0.02 K/uL (ref 0.00–0.07)
Basophils Absolute: 0 K/uL (ref 0.0–0.1)
Basophils Relative: 0 %
Eosinophils Absolute: 0.3 K/uL (ref 0.0–0.5)
Eosinophils Relative: 4 %
HCT: 39.8 % (ref 39.0–52.0)
Hemoglobin: 13.4 g/dL (ref 13.0–17.0)
Immature Granulocytes: 0 %
Lymphocytes Relative: 29 %
Lymphs Abs: 2.2 K/uL (ref 0.7–4.0)
MCH: 27.9 pg (ref 26.0–34.0)
MCHC: 33.7 g/dL (ref 30.0–36.0)
MCV: 82.7 fL (ref 80.0–100.0)
Monocytes Absolute: 0.6 K/uL (ref 0.1–1.0)
Monocytes Relative: 7 %
Neutro Abs: 4.5 K/uL (ref 1.7–7.7)
Neutrophils Relative %: 60 %
Platelets: 201 K/uL (ref 150–400)
RBC: 4.81 MIL/uL (ref 4.22–5.81)
RDW: 15.3 % (ref 11.5–15.5)
WBC: 7.5 K/uL (ref 4.0–10.5)
nRBC: 0 % (ref 0.0–0.2)

## 2024-04-18 LAB — BASIC METABOLIC PANEL WITH GFR
Anion gap: 8 (ref 5–15)
BUN: 22 mg/dL (ref 8–23)
CO2: 24 mmol/L (ref 22–32)
Calcium: 9.4 mg/dL (ref 8.9–10.3)
Chloride: 109 mmol/L (ref 98–111)
Creatinine, Ser: 1.41 mg/dL — ABNORMAL HIGH (ref 0.61–1.24)
GFR, Estimated: 52 mL/min — ABNORMAL LOW (ref >60)
Glucose, Bld: 106 mg/dL — ABNORMAL HIGH (ref 70–99)
Potassium: 3.2 mmol/L — ABNORMAL LOW (ref 3.5–5.1)
Sodium: 141 mmol/L (ref 135–145)

## 2024-04-18 LAB — TSH: TSH: 3.341 u[IU]/mL (ref 0.350–4.500)

## 2024-04-18 MED ORDER — TRAZODONE HCL 50 MG PO TABS
25.0000 mg | ORAL_TABLET | Freq: Once | ORAL | Status: AC
  Administered 2024-04-18: 25 mg via ORAL
  Filled 2024-04-18: qty 1

## 2024-04-18 MED ORDER — HYDRALAZINE HCL 50 MG PO TABS: 50.0000 mg | ORAL_TABLET | Freq: Three times a day (TID) | ORAL | 0 refills | Status: DC | PRN

## 2024-04-18 MED ORDER — POTASSIUM CHLORIDE CRYS ER 20 MEQ PO TBCR
40.0000 meq | EXTENDED_RELEASE_TABLET | Freq: Four times a day (QID) | ORAL | Status: AC
  Administered 2024-04-18 (×2): 40 meq via ORAL
  Filled 2024-04-18 (×2): qty 2

## 2024-04-18 MED ORDER — GABAPENTIN 100 MG PO CAPS: 100.0000 mg | ORAL_CAPSULE | Freq: Every day | ORAL | 0 refills | Status: AC

## 2024-04-18 MED ORDER — ENSURE PLUS HIGH PROTEIN PO LIQD
237.0000 mL | Freq: Two times a day (BID) | ORAL | Status: DC
  Administered 2024-04-18 – 2024-04-20 (×6): 237 mL via ORAL

## 2024-04-18 MED ORDER — VITAMIN B-12 100 MCG PO TABS: 100.0000 ug | ORAL_TABLET | Freq: Every day | ORAL | 0 refills | Status: DC

## 2024-04-18 MED ORDER — AMLODIPINE BESYLATE 10 MG PO TABS
10.0000 mg | ORAL_TABLET | Freq: Every day | ORAL | Status: DC
  Administered 2024-04-18 – 2024-04-20 (×3): 10 mg via ORAL
  Filled 2024-04-18 (×3): qty 1

## 2024-04-18 MED ORDER — MECLIZINE HCL 25 MG PO TABS: 25.0000 mg | ORAL_TABLET | Freq: Three times a day (TID) | ORAL | 0 refills | Status: AC | PRN

## 2024-04-18 MED ORDER — CYANOCOBALAMIN 1000 MCG/ML IJ SOLN
1000.0000 ug | Freq: Once | INTRAMUSCULAR | Status: AC
  Administered 2024-04-18: 1000 ug via INTRAMUSCULAR
  Filled 2024-04-18 (×2): qty 1

## 2024-04-18 NOTE — Progress Notes (Signed)
 OT Cancellation Note  Patient Details Name: [facility] Jefferie Holston. MRN: [id number] DOB: 10-Aug-1946   Cancelled Treatment:    Reason Eval/Treat Not Completed: Patient at procedure or test/ unavailable.  Will attempt treatment at a later date/time as appropriate.    Harlene Sharps OTR/L    Harlene LITTIE Sharps 04/18/2024, 2:47 PM

## 2024-04-18 NOTE — Hospital Course (Addendum)
 24bn with h/o HTN, CAD s/p recent stent, depression/anxiety, BPH, OSA on CPAP, and fibromyalgia who presented on 7/16 with dizziness and a fall. Negative head CT, CTA, and MRI of brain. Concern for hypertensive urgency, BP slowly improving.  Patient and wife report generalized brain fog with progressive weakness since complicated cath with stents in March.  Cardiology is consulting, Echo reassuring  PT/OT also consulting, recommended STR.  Neurology also consulted, reassuring workup including EEG.

## 2024-04-18 NOTE — TOC Progression Note (Signed)
 Transition of Care (TOC) - Progression Note    Patient Details  Name: Joe Patton. MRN: [id number] Date of Birth: 1946-07-05  Transition of Care [facility]) CM/SW Contact  Alvaro Louder, [place] Phone Number: 04/18/2024, 4:26 PM  Clinical Narrative:    LCSWA met with patient and family at the bedside to discuss SNF Vs HH. Family decided that it would be best for the patient to go to SNF to build strength before going back home. Patient was agreeable to this decision. LCSWA completed FL2 and faxed out information in [place]. Families preferences were:  [facility]    TOC to follow for discharge.    Expected Discharge Plan: Skilled Nursing Facility Barriers to Discharge: Barriers Resolved  Expected Discharge Plan and Services   Discharge Planning Services: CM Consult   Living arrangements for the past 2 months: Single Family Home Expected Discharge Date: 04/18/24                                     Social Determinants of Health (SDOH) Interventions SDOH Screenings   Food Insecurity: No Food Insecurity (04/17/2024)  Housing: Low Risk  (04/17/2024)  Transportation Needs: No Transportation Needs (04/17/2024)  Utilities: Not At Risk (04/17/2024)  Alcohol Screen: Low Risk  (02/15/2022)  Depression (PHQ2-9): Low Risk  (06/25/2023)  Financial Resource Strain: Low Risk  (01/22/2024)   Received from [facility]  Physical Activity: Insufficiently Active (02/15/2022)  Social Connections: Socially Integrated (04/17/2024)  Stress: No Stress Concern Present (02/15/2022)  Tobacco Use: Medium Risk (04/16/2024)    Readmission Risk Interventions     No data to display

## 2024-04-18 NOTE — Plan of Care (Signed)
 Patient remains free from any noted signs of acute distress.  No additional medical interventions required at this time.  Patient to continue to be monitored by hospital staff until discharge.

## 2024-04-18 NOTE — Progress Notes (Signed)
 [name] MRN: [id number] DOB/AGE: 11-01-1945 78 y.o.  Admit date: 04/16/2024 Referring Physician Dr. Barbarann Primary Physician Joe Cathryne BROCKS, MD (Inactive) Primary Cardiologist Dr. Florencio Patton for Consultation AMS  HPI: [facility] Joe Patton. is a 78 y.o. 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 Cardiology was consulted for further evaluation.   Interval History: -Patient seen and examined this AM and laying comfortably in hospital bed. Patient states he feels okay this AM. Patient reports he continues to have dizziness at rest, especially when he has to think, also some lightheadedness with PT. Continues to deny chest pain, SOB, or palpitations.  -Patients BP elevated, but improving and HR stable this AM. Overnight Tele showed no significant events.  -Patient remains on room air with stable SpO2.  -PT evaluation yesterday reported patient weakness in his knees with some dizziness with ambulation. Per PT patient would benefit from skilled PT to address functional limitations, patient doesn't have much at home.  Pertinent Cardiac History (Most recent)  CATH PCI (12/25/23) 1. US -guided R CFA (82F), L CFA (530F), and L FV (30F) access.  2. Successful orbital atherectomy of the proximal to distal RCA and RPLA with a 1.25 mm CSI.  3. Successful IVUS-guided PCI to the prox-distal RCA/RPLA with 3 DES, post-dilated to 4.5 mm.      - RPLA: 3.0 x 22 mm Onyx Frontier DES, post-dilated to 3.9 mm.      - Distal RCA: 3.5 x 30 mm Onyx Frontier DES, post-dilated to 4.5 mm  proximally.      - Prox-distal RCA: 4.0 x 48 mm Synergy DES, post-dilated to 4.5 mm.   Review of systems complete and found to be negative unless listed above    Past Medical History:  Diagnosis Date   Actinic keratosis    Allergic rhinitis    Anxiety    Arthritis    Bilirubinuria    BPH (benign prostatic hyperplasia)    Degeneration, intervertebral disc, lumbosacral    Erectile dysfunction    Eustachian tube dysfunction    Fibromyalgia    GERD (gastroesophageal reflux disease)    OTC if needed Pepcid    Heart murmur    Hypertension    Osteoarthrosis    Sinusitis    Sleep apnea    uses CPAP, will bring mask   Viral warts     Past Surgical History:  Procedure Laterality Date   CATARACT EXTRACTION     right   CHOLECYSTECTOMY  2005   COLONOSCOPY  2014   cleared for 10 yrs- Dr Jinny   CYSTOSCOPY WITH LITHOLAPAXY N/A 02/15/2021   Procedure: CYSTOSCOPY WITH LITHOLAPAXY;  Surgeon: Twylla Glendia BROCKS, MD;  Location: [facility] ORS;  Service: Urology;  Laterality: N/A;   EYE SURGERY Right    GALLBLADDER SURGERY     HIP SURGERY     left   JOINT REPLACEMENT     right and left total hips   LEFT HEART CATH AND CORONARY ANGIOGRAPHY Left 11/14/2023   Procedure: LEFT HEART CATH AND CORONARY ANGIOGRAPHY;  Surgeon: Joe Cara BIRCH, MD;  Location: [facility] INVASIVE CV LAB;  Service: Cardiovascular;  Laterality: Left;   TONSILLECTOMY     TOTAL SHOULDER ARTHROPLASTY Left 09/03/2014   Procedure: TOTAL SHOULDER ARTHROPLASTY;  Surgeon: Eva Elsie Herring, MD;  Location[facility];  Service: Orthopedics;  Laterality: Left;    Medications Prior to Admission  Medication Sig Dispense Refill Last Dose/Taking   amLODipine  (NORVASC ) 10 MG tablet Take 10 mg by mouth daily.   Unknown   ascorbic acid (VITAMIN C) 500 MG tablet Take 500 mg by mouth daily.   Past Week   aspirin  EC 81 MG tablet Take 81 mg by mouth daily.   04/16/2024 Morning   cholecalciferol (VITAMIN D3) 25 MCG (1000 UNIT) tablet Take 1,000 Units  by mouth daily.   Unknown   citalopram  (CELEXA ) 20 MG tablet Take 1 tablet (20 mg total) by mouth daily. 90 tablet 0 04/16/2024 Morning   clopidogrel  (PLAVIX ) 75 MG tablet Take 75 mg by mouth daily.   04/16/2024 Morning   finasteride  (PROSCAR ) 5 MG tablet Take 1 tablet (5 mg total) by mouth daily. 90 tablet 3 Unknown   labetalol  (NORMODYNE ) 100 MG tablet Take 100 mg by mouth 2 (two) times daily.   04/16/2024 Evening   losartan  (COZAAR ) 100 MG tablet Take 100 mg by mouth daily.   04/16/2024 Morning   rosuvastatin  (CRESTOR ) 20 MG tablet Take 20 mg by mouth daily.   04/16/2024 Evening   sildenafil  (REVATIO ) 20 MG tablet TAKE 2-5 TABLETS 1 HOUR PRIOR TO INTERCOURSE (Patient taking differently: Take 20 mg by mouth daily as needed. TAKE 2-5 TABLETS 1 HOUR PRIOR TO INTERCOURSE) 90 tablet 2 Unknown   solifenacin  (VESICARE ) 10 MG tablet Take 1 tablet (10 mg total) by mouth daily. 30 tablet 0 Unknown   Social History   Socioeconomic History   Marital status: Married    Spouse name: Not on file   Number of children: 1   Years of education: Not on file   Highest education level: Not on file  Occupational History   Occupation: retired  Tobacco Use   Smoking status: Former    Current packs/day: 0.00    Average packs/day: 1.5 packs/day for 20.0 years (30.0 ttl pk-yrs)    Types: Cigarettes    Start date: 10/03/1987    Quit date: 10/03/2007    Years since quitting: 16.5   Smokeless tobacco: Never   Tobacco comments:    Has not resumed smoking. No need to provide pt with smoking cessation materials  Vaping Use   Vaping status: Never Used  Substance and Sexual Activity   Alcohol use: No   Drug use: No   Sexual activity: Yes  Other Topics Concern   Not on file  Social History Narrative   Not on file   Social Drivers of Health   Financial Resource Strain: Low Risk  (01/22/2024)   Received from [facility]   Overall Financial Resource Strain (CARDIA)    Difficulty of Paying Living  Expenses: Not hard at all  Food Insecurity: No Food Insecurity (04/17/2024)   Hunger Vital Sign    Worried About Running Out of Food in the Last Year: Never true    Ran Out of Food in the Last Year: Never true  Transportation Needs: No Transportation Needs (04/17/2024)   PRAPARE - Administrator, Civil Service (Medical): No    Lack of Transportation (Non-Medical): No  Physical Activity: Insufficiently Active (02/15/2022)   Exercise Vital Sign    Days of Exercise per Week: 7 days  Minutes of Exercise per Session: 20 min  Stress: No Stress Concern Present (02/15/2022)   [employer] of Occupational Health - Occupational Stress Questionnaire    Feeling of Stress : Not at all  Social Connections: Socially Integrated (04/17/2024)   Social Connection and Isolation Panel    Frequency of Communication with Friends and Family: More than three times a week    Frequency of Social Gatherings with Friends and Family: More than three times a week    Attends Religious Services: More than 4 times per year    Active Member of Clubs or Organizations: Yes    Attends Banker Meetings: More than 4 times per year    Marital Status: Married  Catering manager Violence: Not At Risk (04/17/2024)   Humiliation, Afraid, Rape, and Kick questionnaire    Fear of Current or Ex-Partner: No    Emotionally Abused: No    Physically Abused: No    Sexually Abused: No    Family History  Problem Relation Age of Onset   Breast cancer Mother    Hypertension Father    Heart attack Father    Stroke Father      Vitals:   04/17/24 2101 04/18/24 0415 04/18/24 0640 04/18/24 0640  BP: (!) 175/95 (!) 147/80 (!) 164/96 (!) 164/96  Pulse: (!) 59 (!) 59  61  Resp: 18 16    Temp: 97.8 F (36.6 C) 97.8 F (36.6 C)    TempSrc:      SpO2: 96% 98%  95%  Weight:      Height:        PHYSICAL EXAM General: Well-appearing elderly male, well nourished, in no acute distress. HEENT: Normocephalic and  atraumatic. Neck: No JVD.   Lungs: Normal respiratory effort on room air. Clear bilaterally to auscultation. No wheezes, crackles, rhonchi.  Heart: HRRR. Normal S1 and S2 without gallops or murmurs.  Abdomen: Non-distended appearing.  Msk: Normal strength and tone for age. Extremities: Warm and well perfused. No clubbing, cyanosis, edema.  Neuro: Alert and oriented X 3. Psych: Answers questions appropriately.   Labs: Basic Metabolic Panel: Recent Labs    04/16/24 1739 04/17/24 0440 04/18/24 0338  NA  --  141 141  K  --  3.2* 3.2*  CL  --  108 109  CO2  --  24 24  GLUCOSE  --  105* 106*  BUN  --  19 22  CREATININE  --  1.17 1.41*  CALCIUM   --  8.9 9.4  MG 2.2  --   --   PHOS 3.2  --   --    Liver Function Tests: Recent Labs    04/16/24 1532  AST 25  ALT 19  ALKPHOS 77  BILITOT 1.0  PROT 7.4  ALBUMIN 4.1   No results for input(s): LIPASE, AMYLASE in the last 72 hours. CBC: Recent Labs    04/17/24 0440 04/18/24 0338  WBC 9.1 7.5  NEUTROABS  --  4.5  HGB 13.7 13.4  HCT 41.9 39.8  MCV 84.0 82.7  PLT 206 201   Cardiac Enzymes: Recent Labs    04/16/24 1532 04/16/24 1739  CKTOTAL 163  --   TROPONINIHS 57* 57*   BNP: No results for input(s): BNP in the last 72 hours. D-Dimer: No results for input(s): DDIMER in the last 72 hours. Hemoglobin A1C: No results for input(s): HGBA1C in the last 72 hours. Fasting Lipid Panel: No results for input(s): CHOL, HDL, LDLCALC, TRIG, CHOLHDL, LDLDIRECT in  the last 72 hours. Thyroid Function Tests: No results for input(s): TSH, T4TOTAL, T3FREE, THYROIDAB in the last 72 hours.  Invalid input(s): FREET3 Anemia Panel: Recent Labs    04/17/24 0440  VITAMINB12 240     Radiology: MR BRAIN WO CONTRAST Result Date: 04/17/2024 CLINICAL DATA:  Syncope/presyncope, cerebrovascular cause suspected EXAM: MRI HEAD WITHOUT CONTRAST TECHNIQUE: Multiplanar, multiecho pulse sequences of the brain and  surrounding structures were obtained without intravenous contrast. COMPARISON:  CT head from April 16, 2024. FINDINGS: Brain: No acute infarction, hemorrhage, hydrocephalus, extra-axial collection or mass lesion. Cerebral atrophy. Moderate patchy T2/FLAIR hyperintensities the white matter, compatible with chronic microvascular ischemic disease. Multiple small remote lacunar infarcts in the corona radiata and basal ganglia bilaterally. Vascular: Normal flow voids. Skull and upper cervical spine: Normal marrow signal. Sinuses/Orbits: Negative. Other: No mastoid effusions. IMPRESSION: 1. No evidence of acute intracranial abnormality. 2. Moderate chronic microvascular ischemic disease. 3.  Cerebral Atrophy (ICD10-G31.9). Electronically Signed   By: Gilmore GORMAN Molt M.D.   On: 04/17/2024 02:45   CT ANGIO HEAD NECK W WO CM Result Date: 04/16/2024 CLINICAL DATA:  Neuro deficit, acute, stroke suspected EXAM: CT ANGIOGRAPHY HEAD AND NECK WITH AND WITHOUT CONTRAST TECHNIQUE: Multidetector CT imaging of the head and neck was performed using the standard protocol during bolus administration of intravenous contrast. Multiplanar CT image reconstructions and MIPs were obtained to evaluate the vascular anatomy. Carotid stenosis measurements (when applicable) are obtained utilizing NASCET criteria, using the distal internal carotid diameter as the denominator. RADIATION DOSE REDUCTION: This exam was performed according to the departmental dose-optimization program which includes automated exposure control, adjustment of the mA and/or kV according to patient size and/or use of iterative reconstruction technique. CONTRAST:  75mL OMNIPAQUE  IOHEXOL  350 MG/ML SOLN COMPARISON:  Same day CT head. FINDINGS: CTA NECK FINDINGS Aortic arch: Aortic atherosclerosis. Great vessel origins are patent. Right carotid system: No evidence of dissection, stenosis (50% or greater), or occlusion. Left carotid system: No evidence of dissection, stenosis  (50% or greater), or occlusion. Vertebral arteries: Left dominant. No evidence of dissection, stenosis (50% or greater), or occlusion. Skeleton: No acute abnormality on limited assessment. Severe multilevel degenerative change. Other neck: No evidence of acute abnormality on limited assessment. Upper chest: Visualized lung apices are clear. Review of the MIP images confirms the above findings CTA HEAD FINDINGS Anterior circulation: Extensive calcific atherosclerosis of the intracranial ICAs bilaterally with mild stenosis. The MCAs and ACAs are patent without proximal hemodynamically significant stenosis. Posterior circulation: Bilateral intradural vertebral arteries, basilar artery and bilateral posterior rods are patent without proximal hemodynamically significant stenosis. Left fetal type PCA, anatomic variant. Venous sinuses: As permitted by contrast timing, patent. Review of the MIP images confirms the above findings IMPRESSION: No emergent large vessel occlusion or proximal hemodynamically significant stenosis. Electronically Signed   By: Gilmore GORMAN Molt M.D.   On: 04/16/2024 21:48   CT HEAD WO CONTRAST Result Date: 04/16/2024 CLINICAL DATA:  Dizziness weakness EXAM: CT HEAD WITHOUT CONTRAST TECHNIQUE: Contiguous axial images were obtained from the base of the skull through the vertex without intravenous contrast. RADIATION DOSE REDUCTION: This exam was performed according to the departmental dose-optimization program which includes automated exposure control, adjustment of the mA and/or kV according to patient size and/or use of iterative reconstruction technique. COMPARISON:  None Available. FINDINGS: Brain: No acute territorial infarction, hemorrhage or intracranial mass. Mild to moderate atrophy. Extensive white matter hypodensity. Probable small chronic lacunar infarcts in the basal ganglia. Mild ventricular enlargement, likely due to  atrophy. Suspect some degree of ex vacuo enlargement of the left  lateral ventricle. Vascular: Carotid and vertebral vascular calcification. Small anterior interhemispheric hyperdensity series 2, image 18, coronal series 4, image 19, sagittal series 5, image 29. Some calcification in the region. This measures 3 mm on sagittal images and is slightly bulbous in configuration Skull: Normal. Negative for fracture or focal lesion. Sinuses/Orbits: No acute finding. Other: None IMPRESSION: 1. Negative for acute intracranial hemorrhage or mass. 2. Atrophy and extensive white matter disease, possible chronic small vessel change. Suspect chronic lacunar infarcts in the basal ganglia. 3. Small anterior interhemispheric hyperdensity, question small aneurysm. Suggest CTA for further evaluation. Electronically Signed   By: Luke Bun M.D.   On: 04/16/2024 16:40    ECHO 10/2023 MILD LEFT VENTRICULAR SYSTOLIC DYSFUNCTION WITH MILD LVH  ESTIMATED EF: 50%  NORMAL LA PRESSURES WITH DIASTOLIC DYSFUNCTION (GRADE 1)  NORMAL RIGHT VENTRICULAR SYSTOLIC FUNCTION  VALVULAR REGURGITATION: MILD AR, TRIVIAL MR, No PR, TRIVIAL TR  NO VALVULAR STENOSIS   TELEMETRY reviewed by me 04/18/2024: Sinus rhythm, rate 70s  EKG reviewed by me: .  Trops mildly elevated and flat 57 > 57. EKG with sinus rhythm, PAC, RBBB, rate 69 bpm without acute ischemic changes.  Data reviewed by me 04/18/2024: last 24h vitals tele labs imaging I/O hospitalist progress notes  Principal Problem:   Dizziness Active Problems:   OSA (obstructive sleep apnea)   CAD (coronary artery disease)   Depression with anxiety   Acute kidney injury superimposed on chronic kidney disease (HCC)   Fall at home, initial encounter   Myocardial injury   Hypertensive urgency   Hypokalemia   Peripheral neuropathy   Generalized weakness    ASSESSMENT AND PLAN:  Joe Patton. is a 78 y.o. male  with a past medical history of coronary artery disease s/p PCI to RCA orbital atherectomy and stenting of the proximal to distal  vessel (12/25/23 at 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 Cardiology was consulted for further evaluation.   # CAD s/p PCI to RCA orbital atherectomy and stenting of the prox-distal  (12/25/23) # Hypertension # Hyperlipidemia # Demand ischemia, secondary to hypertensive urgency  # AKI  Patient without chest pain. Trops mildly elevated and flat 57 > 57. EKG with sinus rhythm, PAC, RBBB, rate 69 bpm without acute ischemic changes.  Upon admission BP elevated, patient has received 2X IV hydralazine . BP remains elevated but improving. -Continue Plavix  75 mg daily, rosuvastatin  20 mg daily. (Do not resume home ASA) -Continue home labetalol  100 mg twice daily.  -Continue hydralazine  50 mg 3 times daily. -Continue home losartan  100 mg daily.  -Ordered amlodipine  10 mg daily.   # Dizziness, generalized weakness # Falls Patient presents with a few falls with associated lightheadedness and weakness. Most recent fall associated with mild shortness of breath, without chest pain. Also recent 20lb weight loss within the past 2 months, with normal appetite. CTA negative for any significant stenosis or occlusion.  -Echo ordered. This can also be done outpatient. Scheduled on 07/21 at [facility].  -Cardiac monitor placed for 7 days. -Recommend PT.  -Neurology consulted.   Follow-up scheduled with Cardiology Sandor Maiden, NP on 07/22; Dr. Florencio on 08/25)  This patient's plan of care was discussed and created with Dr. Florencio and he is in agreement.  Signed: Dorene Comfort, PA-C  04/18/2024, 7:16 AM [facility]

## 2024-04-18 NOTE — Procedures (Addendum)
 History: 78 year old male being evaluated for confusion, rule out seizures  EEG Duration: 59 minutes  Sedation: None  Patient State: Awake and asleep  Technique: This EEG was acquired with electrodes placed according to the International 10-20 electrode system (including Fp1, Fp2, F3, F4, C3, C4, P3, P4, O1, O2, T3, T4, T5, T6, A1, A2, Fz, Cz, Pz). The following electrodes were missing or displaced: none.   Background: The background consists of intermixed alpha and beta activities. There is a well defined posterior dominant rhythm of nine hz that attenuates with eye opening. Sleep is recorded with normal appearing structures.  There is myogenic artifact on the left consistent with the patient's known left facial Hemi spasm  Photic stimulation: Physiologic driving is not performed  EEG Abnormalities: None  Clinical Interpretation: This normal EEG is recorded in the waking and sleep state. There was no seizure or seizure predisposition recorded on this study. Please note that lack of epileptiform activity on EEG does not preclude the possibility of epilepsy.   Aisha Seals, MD [facility]   If 7pm- 7am, please page neurology on call as listed in AMION.

## 2024-04-18 NOTE — Consult Note (Signed)
 NEUROLOGY CONSULT NOTE   Date of service: April 18, 2024 Patient Name: Joe Patton. MRN:  [id number] DOB:  Jul 24, 1946 Chief Complaint: Brain fog Requesting Provider: Barbarann Nest, MD  History of Present Illness  Joe Patton. is a 78 y.o. male with hx of hypertension, remote history of smoking, hyperlipidemia, who presented with hypertensive emergency with complaints of brain fog as well as dizziness which she describes as a sense of disequilibrium.  He states that he just feels like he cannot quite get his thoughts together.  This markedly worsened on the day of admission, but he has had some problems with this since he had a surgical repair of a pseudoaneurysm several months ago.  Over the past few weeks, he noticed it was slightly worse, but markedly worsened when he was admitted to the hospital.  He states that he had mild confusion while in the hospital previously but not anything quite like what he is experiencing now.    In workup of his hypertensive emergency, he had an MRI of his brain which is negative for acute findings but demonstrates exceedingly extensive small vessel white matter disease.   He did have a single episode while we were talking where he states I just lost my train of thought and could not continue, but did not truly have any type of behavioral arrest.  Given the persistent confusion despite blood pressures improving, neurology was consulted.  Past History   Past Medical History:  Diagnosis Date   Actinic keratosis    Allergic rhinitis    Anxiety    Arthritis    Bilirubinuria    BPH (benign prostatic hyperplasia)    Degeneration, intervertebral disc, lumbosacral    Erectile dysfunction    Eustachian tube dysfunction    Fibromyalgia    GERD (gastroesophageal reflux disease)    OTC if needed Pepcid    Heart murmur    Hypertension    Osteoarthrosis    Sinusitis    Sleep apnea    uses CPAP, will bring mask   Viral warts     Past  Surgical History:  Procedure Laterality Date   CATARACT EXTRACTION     right   CHOLECYSTECTOMY  2005   COLONOSCOPY  2014   cleared for 10 yrs- Dr Jinny   CYSTOSCOPY WITH LITHOLAPAXY N/A 02/15/2021   Procedure: CYSTOSCOPY WITH LITHOLAPAXY;  Surgeon: Twylla Glendia BROCKS, MD;  Location: [facility] ORS;  Service: Urology;  Laterality: N/A;   EYE SURGERY Right    GALLBLADDER SURGERY     HIP SURGERY     left   JOINT REPLACEMENT     right and left total hips   LEFT HEART CATH AND CORONARY ANGIOGRAPHY Left 11/14/2023   Procedure: LEFT HEART CATH AND CORONARY ANGIOGRAPHY;  Surgeon: Florencio Cara BIRCH, MD;  Location: [facility] INVASIVE CV LAB;  Service: Cardiovascular;  Laterality: Left;   TONSILLECTOMY     TOTAL SHOULDER ARTHROPLASTY Left 09/03/2014   Procedure: TOTAL SHOULDER ARTHROPLASTY;  Surgeon: Eva Elsie Herring, MD;  Location: [facility] OR;  Service: Orthopedics;  Laterality: Left;    Family History: Family History  Problem Relation Age of Onset   Breast cancer Mother    Hypertension Father    Heart attack Father    Stroke Father     Social History  reports that he quit smoking about 16 years ago. His smoking use included cigarettes. He started smoking about 36 years ago. He has a 30 pack-year smoking history. He has never used  smokeless tobacco. He reports that he does not drink alcohol and does not use drugs.  Allergies  Allergen Reactions   Tramadol      Anger issues   Latex Rash    Medications   Current Facility-Administered Medications:    acetaminophen  (TYLENOL ) tablet 650 mg, 650 mg, Oral, Q6H PRN, Niu, Xilin, MD, 650 mg at 04/18/24 9093   amLODipine  (NORVASC ) tablet 10 mg, 10 mg, Oral, Daily, Decoste, Gabriella, PA-C, 10 mg at 04/18/24 9094   citalopram  (CELEXA ) tablet 20 mg, 20 mg, Oral, Daily, Niu, Xilin, MD, 20 mg at 04/18/24 0905   clopidogrel  (PLAVIX ) tablet 75 mg, 75 mg, Oral, Daily, Niu, Xilin, MD, 75 mg at 04/18/24 9093   enoxaparin  (LOVENOX ) injection 40 mg, 40 mg,  Subcutaneous, Q24H, Niu, Xilin, MD, 40 mg at 04/17/24 2152   feeding supplement (ENSURE PLUS HIGH PROTEIN) liquid 237 mL, 237 mL, Oral, BID BM, Barbarann Nest, MD, 237 mL at 04/18/24 0908   gabapentin  (NEURONTIN ) capsule 100 mg, 100 mg, Oral, QHS, Niu, Xilin, MD, 100 mg at 04/17/24 2153   hydrALAZINE  (APRESOLINE ) injection 10 mg, 10 mg, Intravenous, Q2H PRN, Niu, Xilin, MD, 10 mg at 04/17/24 1232   hydrALAZINE  (APRESOLINE ) tablet 50 mg, 50 mg, Oral, Q8H, Barbarann Nest, MD, 50 mg at 04/18/24 9359   labetalol  (NORMODYNE ) tablet 100 mg, 100 mg, Oral, BID, Niu, Xilin, MD, 100 mg at 04/18/24 9094   losartan  (COZAAR ) tablet 100 mg, 100 mg, Oral, Daily, Decoste, Gabriella, PA-C, 100 mg at 04/18/24 9093   meclizine  (ANTIVERT ) tablet 25 mg, 25 mg, Oral, TID PRN, Niu, Xilin, MD   melatonin tablet 5 mg, 5 mg, Oral, QHS, Niu, Xilin, MD, 5 mg at 04/17/24 2152   ondansetron  (ZOFRAN ) injection 4 mg, 4 mg, Intravenous, Q8H PRN, Niu, Xilin, MD   potassium chloride  SA (KLOR-CON  M) CR tablet 40 mEq, 40 mEq, Oral, Q6H, Decoste, Gabriella, PA-C, 40 mEq at 04/18/24 0905   rosuvastatin  (CRESTOR ) tablet 20 mg, 20 mg, Oral, Daily, Niu, Xilin, MD, 20 mg at 04/18/24 0906  Vitals   Vitals:   04/18/24 0415 04/18/24 0640 04/18/24 0640 04/18/24 0743  BP: (!) 147/80 (!) 164/96 (!) 164/96 (!) 143/89  Pulse: (!) 59  61 65  Resp: 16   17  Temp: 97.8 F (36.6 C)   97.8 F (36.6 C)  TempSrc:      SpO2: 98%  95% 98%  Weight:      Height:        Body mass index is 28.25 kg/m.   Physical Exam   Constitutional: Appears well-developed and well-nourished.    Neurologic Examination    Neuro: Mental Status: Patient is awake, alert, oriented to person, place, month, year, and situation. Patient is able to give a clear and coherent history. No signs of aphasia or neglect With rolled backwards he states D-L-R, and there is a W and O in there too two when asked how many quarters are in $2.75 he states while there  are eight and two dollars, and three in $0.75, and now my thoughts are getting jumbled Cranial Nerves: II: Visual Fields are full. Pupils are equal, round, and reactive to light.   III,IV, VI: EOMI without ptosis or diploplia.  V: Facial sensation is symmetric to temperature VII: Facial movement is notable for left facial weakness following a previous injury with twitching which he states has been chronic for many years VIII: hearing is intact to voice X: Uvula elevates symmetrically XII: tongue is midline without  atrophy or fasciculations.  Motor: Tone is normal. Bulk is normal. 5/5 strength was present in all four extremities.  Sensory: Sensation is symmetric to light touch and temperature in the arms and legs. Cerebellar: FNF intact bilaterally        Labs/Imaging/Neurodiagnostic studies   CBC:  Recent Labs  Lab 05-02-24 0440 04/18/24 0338  WBC 9.1 7.5  NEUTROABS  --  4.5  HGB 13.7 13.4  HCT 41.9 39.8  MCV 84.0 82.7  PLT 206 201   Basic Metabolic Panel:  Lab Results  Component Value Date   NA 141 04/18/2024   K 3.2 (L) 04/18/2024   CO2 24 04/18/2024   GLUCOSE 106 (H) 04/18/2024   BUN 22 04/18/2024   CREATININE 1.41 (H) 04/18/2024   CALCIUM  9.4 04/18/2024   GFRNONAA 52 (L) 04/18/2024   GFRAA >90 09/04/2014   Lipid Panel:  Lab Results  Component Value Date   LDLCALC 134 (H) 06/25/2023   HgbA1c:  Lab Results  Component Value Date   HGBA1C 5.1 10/04/2021   Urine Drug Screen: No results found for: LABOPIA, COCAINSCRNUR, LABBENZ, AMPHETMU, THCU, LABBARB  Alcohol Level No results found for: [facility] INR  Lab Results  Component Value Date   INR 1.1 04/16/2024   APTT  Lab Results  Component Value Date   APTT 30 04/16/2024   MRI Brain(Personally reviewed): Extensive white matter disease as well as change in the pons and thalami  ASSESSMENT   Joe Patton. is a 78 y.o. male presenting with confusion in the setting of hypertensive  emergency.  My suspicion is that this does represent mild hypertensive encephalopathy which I would expect clear over time.  Given that he does have some complaints of cognitive changes even antecedent to his hypertensive crisis, I think would be reasonable to check a TSH.  His B12 is borderline at 240, I will give a single injection and would recommend he stay on an oral supplement.  He has very extensive white matter disease, and I think it would even be possible to obscure some changes associated with pres, and this is a possibility.  In any case treatment would be BP control.  RECOMMENDATIONS  B12 1000 mcg x 1, check methylmalonic acid Consider B12 supplement TSH EEG If EEG is negative, I would favor treating this as hypertensive encephalopathy with maintenance of BP control ______________________________________________________________________   Signed, Aisha Seals, MD [facility]

## 2024-04-18 NOTE — Discharge Instructions (Signed)
 Some PCP options in [place] area- not a comprehensive list  [facility]- [phone] [facility]- [phone] [facility]- [phone] [facility]- [phone] [facility]- [phone] [facility]- [phone]  or [facility] Physician Referral Line [phone]
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
## 2024-04-18 NOTE — Progress Notes (Signed)
 Patient and family have decided that they prefer STR rather than home health and so dc order is canceled and will work on placement,  Delon CHARLENA Herald, M.D.

## 2024-04-18 NOTE — Progress Notes (Signed)
 Eeg done

## 2024-04-18 NOTE — NC FL2 (Signed)
 [place]  MEDICAID FL2 LEVEL OF CARE FORM     IDENTIFICATION  Patient Name: Joe Patton Joe Patton. Birthdate: 1945/10/15 Sex: male Admission Date (Current Location): 04/16/2024  [place] and [place] Number:  Chiropodist and Address:  [facility], [address], [place], [place] [address]      Provider Number: [id number]  Attending Physician Name and Address:  Barbarann Nest, MD  Relative Name and Phone Number:  Leita Law [phone]    Current Level of Care: Hospital Recommended Level of Care: Skilled Nursing Facility Prior Approval Number:    Date Approved/Denied:   PASRR Number: [id number]  Discharge Plan: SNF    Current Diagnoses: Patient Active Problem List   Diagnosis Date Noted   Generalized weakness 04/17/2024   Dizziness 04/16/2024   CAD (coronary artery disease) 04/16/2024   Depression with anxiety 04/16/2024   Acute kidney injury superimposed on chronic kidney disease (HCC) 04/16/2024   Overweight (BMI 25.0-29.9) 04/16/2024   Fall at home, initial encounter 04/16/2024   Myocardial injury 04/16/2024   Hypertensive urgency 04/16/2024   Hypokalemia 04/16/2024   Peripheral neuropathy 04/16/2024   Actinic keratoses 12/11/2018   Allergic rhinitis 12/11/2018   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   Basal cell papilloma 12/11/2018   Bilirubin in urine 12/11/2018   Chronic venous insufficiency 12/11/2018   Coxitis 12/11/2018   DDD (degenerative disc disease), lumbosacral 12/11/2018   Dermatitis, stasis 12/11/2018   Dysfunction of eustachian tube 12/11/2018   Flu vaccine need 12/11/2018   Heart murmur 12/11/2018   Hypertension 12/11/2018   Impingement syndrome of shoulder 12/11/2018   Insect bite, nonvenomous 12/11/2018   Need for vaccination 12/11/2018   Preop examination 12/11/2018   Sinus infection 12/11/2018   Recurrent major depressive disorder, in full remission (HCC) 05/21/2018   Benign prostatic  hyperplasia with lower urinary tract symptoms 12/05/2017   Erectile dysfunction 12/05/2017   Condyloma acuminatum 09/04/2017   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   Presence of artificial shoulder joint 09/03/2014   Osteoarthritis of both knees 07/07/2014   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   OSA (obstructive sleep apnea) 05/08/2014   Abnormal EKG 05/06/2014   Depression 05/06/2014   Obesity 05/06/2014   Urinary urgency 07/22/2012    Orientation RESPIRATION BLADDER Height & Weight     Self, Time, Situation, Place  Normal Continent Weight: 220 lb 0.3 oz (99.8 kg) Height:  6' 2 (188 cm)  BEHAVIORAL SYMPTOMS/MOOD NEUROLOGICAL BOWEL NUTRITION STATUS      Continent Diet (2 Gram Sodium)  AMBULATORY STATUS COMMUNICATION OF NEEDS Skin   Limited Assist Verbally Normal, Bruising                       Personal Care Assistance Level of Assistance  Bathing, Feeding, Dressing Bathing Assistance: Limited assistance Feeding assistance: Independent Dressing Assistance: Limited assistance     Functional Limitations Info  Sight Sight Info: Impaired        SPECIAL CARE FACTORS FREQUENCY  PT (By licensed PT), OT (By licensed OT)     PT Frequency: 5x/week OT Frequency: 5x/week            Contractures      Additional Factors Info  Code Status, Allergies Code Status Info: DNR Limited Allergies Info: Tramadol , Latex           Current Medications (04/18/2024):  This is the current hospital active medication list Current Facility-Administered Medications  Medication Dose Route Frequency Provider Last Rate Last  Admin   acetaminophen  (TYLENOL ) tablet 650 mg  650 mg Oral Q6H PRN Niu, Xilin, MD   650 mg at 04/18/24 0906   amLODipine  (NORVASC ) tablet 10 mg  10 mg Oral Daily Decoste, Gabriella, PA-C   10 mg at 04/18/24 9094   citalopram  (CELEXA ) tablet 20 mg  20 mg Oral Daily Niu, Xilin, MD   20 mg at 04/18/24 9094   clopidogrel  (PLAVIX ) tablet 75 mg  75 mg Oral Daily  Niu, Xilin, MD   75 mg at 04/18/24 0906   cyanocobalamin (VITAMIN B12) injection 1,000 mcg  1,000 mcg Intramuscular Once Michaela Aisha SQUIBB, MD       enoxaparin  (LOVENOX ) injection 40 mg  40 mg Subcutaneous Q24H Niu, Xilin, MD   40 mg at 04/17/24 2152   feeding supplement (ENSURE PLUS HIGH PROTEIN) liquid 237 mL  237 mL Oral BID BM Barbarann Nest, MD   237 mL at 04/18/24 1536   gabapentin  (NEURONTIN ) capsule 100 mg  100 mg Oral QHS Niu, Xilin, MD   100 mg at 04/17/24 2153   hydrALAZINE  (APRESOLINE ) injection 10 mg  10 mg Intravenous Q2H PRN Niu, Xilin, MD   10 mg at 04/17/24 1232   hydrALAZINE  (APRESOLINE ) tablet 50 mg  50 mg Oral Q8H Yates, Jennifer, MD   50 mg at 04/18/24 1536   labetalol  (NORMODYNE ) tablet 100 mg  100 mg Oral BID Niu, Xilin, MD   100 mg at 04/18/24 9094   losartan  (COZAAR ) tablet 100 mg  100 mg Oral Daily Decoste, Gabriella, PA-C   100 mg at 04/18/24 9093   meclizine  (ANTIVERT ) tablet 25 mg  25 mg Oral TID PRN Niu, Xilin, MD       melatonin tablet 5 mg  5 mg Oral QHS Niu, Xilin, MD   5 mg at 04/17/24 2152   ondansetron  (ZOFRAN ) injection 4 mg  4 mg Intravenous Q8H PRN Niu, Xilin, MD       rosuvastatin  (CRESTOR ) tablet 20 mg  20 mg Oral Daily Niu, Xilin, MD   20 mg at 04/18/24 9093     Discharge Medications: Please see discharge summary for a list of discharge medications.  Relevant Imaging Results:  Relevant Lab Results:   Additional Information SSN: [id number]  Etter Royall  [place], LCSW
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
## 2024-04-18 NOTE — TOC Initial Note (Signed)
 Transition of Care (TOC) - Initial/Assessment Note    Patient Details  Name: Joe Patton. MRN: [id number] Date of Birth: 08-23-1946  Transition of Care [facility]) CM/SW Contact:    Dalia GORMAN Fuse, RN Phone Number: 04/18/2024, 1:59 PM  Clinical Narrative:                 TOC spoke with the patients wife in his room. The patient is from home independently. He is able to drive himself to and from appts. His PCP retired, [employer] provided a list of PCPs. He uses [facility] in [place]. He has DME: walker. TOC offered choice and there was no preference. TOC sent referral for [facility] PT/OT to [facility] with [facility]. DME not needed.  Expected Discharge Plan: Home w Home Health Services Barriers to Discharge: Barriers Resolved   Patient Goals and CMS Choice            Expected Discharge Plan and Services   Discharge Planning Services: CM Consult   Living arrangements for the past 2 months: Single Family Home Expected Discharge Date: 04/18/24                                    Prior Living Arrangements/Services Living arrangements for the past 2 months: Single Family Home Lives with:: Spouse              Current home services: DME ([facility] walker)    Activities of Daily Living   ADL Screening (condition at time of admission) Independently performs ADLs?: Yes (appropriate for developmental age) Is the patient deaf or have difficulty hearing?: No Does the patient have difficulty seeing, even when wearing glasses/contacts?: No Does the patient have difficulty concentrating, remembering, or making decisions?: No  Permission Sought/Granted                  Emotional Assessment Appearance:: Appears stated age Attitude/Demeanor/Rapport: Engaged Affect (typically observed): Appropriate Orientation: : Oriented to Place, Oriented to  Time, Oriented to Self, Oriented to Situation   Psych Involvement: No (comment)  Admission diagnosis:  Dizziness [R42] Hypertensive  urgency [I16.0] Generalized weakness [R53.1] AKI (acute kidney injury) (HCC) [N17.9] Patient Active Problem List   Diagnosis Date Noted   Generalized weakness 04/17/2024   Dizziness 04/16/2024   CAD (coronary artery disease) 04/16/2024   Depression with anxiety 04/16/2024   Acute kidney injury superimposed on chronic kidney disease (HCC) 04/16/2024   Overweight (BMI 25.0-29.9) 04/16/2024   Fall at home, initial encounter 04/16/2024   Myocardial injury 04/16/2024   Hypertensive urgency 04/16/2024   Hypokalemia 04/16/2024   Peripheral neuropathy 04/16/2024   Actinic keratoses 12/11/2018   Allergic rhinitis 12/11/2018   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   Basal cell papilloma 12/11/2018   Bilirubin in urine 12/11/2018   Chronic venous insufficiency 12/11/2018   Coxitis 12/11/2018   DDD (degenerative disc disease), lumbosacral 12/11/2018   Dermatitis, stasis 12/11/2018   Dysfunction of eustachian tube 12/11/2018   Flu vaccine need 12/11/2018   Heart murmur 12/11/2018   Hypertension 12/11/2018   Impingement syndrome of shoulder 12/11/2018   Insect bite, nonvenomous 12/11/2018   Need for vaccination 12/11/2018   Preop examination 12/11/2018   Sinus infection 12/11/2018   Recurrent major depressive disorder, in full remission (HCC) 05/21/2018   Benign prostatic hyperplasia with lower urinary tract symptoms 12/05/2017   Erectile dysfunction 12/05/2017   Condyloma acuminatum 09/04/2017   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 Pharmacy:   [facility] - [place], [place] - [address] [place] [address] Phone: [phone]  Fax: [phone]     Social Drivers of Health (SDOH) Social History: SDOH Screenings   Food Insecurity: No Food Insecurity (04/17/2024)  Housing: Low Risk  (04/17/2024)  Transportation Needs: No Transportation Needs (04/17/2024)  Utilities: Not At Risk (04/17/2024)  Alcohol Screen: Low Risk  (02/15/2022)  Depression (PHQ2-9): Low Risk  (06/25/2023)  Financial Resource Strain: Low Risk  (01/22/2024)   Received from [facility]  Physical Activity: Insufficiently Active (02/15/2022)  Social Connections: Socially Integrated (04/17/2024)  Stress: No Stress Concern Present (02/15/2022)  Tobacco Use: Medium Risk (04/16/2024)   SDOH Interventions:     Readmission Risk Interventions     No data to display

## 2024-04-19 DIAGNOSIS — R42 Dizziness and giddiness: Secondary | ICD-10-CM | POA: Diagnosis not present

## 2024-04-19 LAB — CBC WITH DIFFERENTIAL/PLATELET
Abs Immature Granulocytes: 0.02 K/uL (ref 0.00–0.07)
Basophils Absolute: 0.1 K/uL (ref 0.0–0.1)
Basophils Relative: 1 %
Eosinophils Absolute: 0.3 K/uL (ref 0.0–0.5)
Eosinophils Relative: 4 %
HCT: 39.6 % (ref 39.0–52.0)
Hemoglobin: 13.3 g/dL (ref 13.0–17.0)
Immature Granulocytes: 0 %
Lymphocytes Relative: 25 %
Lymphs Abs: 1.8 K/uL (ref 0.7–4.0)
MCH: 28.2 pg (ref 26.0–34.0)
MCHC: 33.6 g/dL (ref 30.0–36.0)
MCV: 83.9 fL (ref 80.0–100.0)
Monocytes Absolute: 0.4 K/uL (ref 0.1–1.0)
Monocytes Relative: 6 %
Neutro Abs: 4.7 K/uL (ref 1.7–7.7)
Neutrophils Relative %: 64 %
Platelets: 192 K/uL (ref 150–400)
RBC: 4.72 MIL/uL (ref 4.22–5.81)
RDW: 15.8 % — ABNORMAL HIGH (ref 11.5–15.5)
WBC: 7.3 K/uL (ref 4.0–10.5)
nRBC: 0 % (ref 0.0–0.2)

## 2024-04-19 LAB — BASIC METABOLIC PANEL WITH GFR
Anion gap: 6 (ref 5–15)
BUN: 22 mg/dL (ref 8–23)
CO2: 24 mmol/L (ref 22–32)
Calcium: 9 mg/dL (ref 8.9–10.3)
Chloride: 110 mmol/L (ref 98–111)
Creatinine, Ser: 1.23 mg/dL (ref 0.61–1.24)
GFR, Estimated: >60 mL/min (ref >60)
Glucose, Bld: 109 mg/dL — ABNORMAL HIGH (ref 70–99)
Potassium: 4 mmol/L (ref 3.5–5.1)
Sodium: 140 mmol/L (ref 135–145)

## 2024-04-19 MED ORDER — DOCUSATE SODIUM 100 MG PO CAPS
100.0000 mg | ORAL_CAPSULE | Freq: Two times a day (BID) | ORAL | Status: DC
  Administered 2024-04-19: 100 mg via ORAL
  Filled 2024-04-19 (×2): qty 1

## 2024-04-19 MED ORDER — OXYCODONE HCL 5 MG PO TABS
5.0000 mg | ORAL_TABLET | ORAL | Status: DC | PRN
  Administered 2024-04-19 – 2024-04-20 (×2): 5 mg via ORAL
  Filled 2024-04-19 (×2): qty 1

## 2024-04-19 MED ORDER — POLYETHYLENE GLYCOL 3350 17 G PO PACK
17.0000 g | PACK | Freq: Every day | ORAL | Status: DC
  Administered 2024-04-20: 17 g via ORAL
  Filled 2024-04-19 (×2): qty 1

## 2024-04-19 MED ORDER — OXYCODONE HCL 5 MG PO TABS: 5.0000 mg | ORAL_TABLET | ORAL | 0 refills | Status: DC | PRN

## 2024-04-19 MED ORDER — OXYCODONE HCL 5 MG PO TABS: 5.0000 mg | ORAL_TABLET | ORAL | Status: DC | PRN
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
## 2024-04-19 NOTE — Plan of Care (Signed)
  Problem: Education: Goal: Knowledge of General Education information will improve Description: Including pain rating scale, medication(s)/side effects and non-pharmacologic comfort measures Outcome: Adequate for Discharge   Problem: Health Behavior/Discharge Planning: Goal: Ability to manage health-related needs will improve Outcome: Adequate for Discharge   Problem: Clinical Measurements: Goal: Ability to maintain clinical measurements within normal limits will improve Outcome: Adequate for Discharge Goal: Will remain free from infection Outcome: Adequate for Discharge Goal: Diagnostic test results will improve Outcome: Adequate for Discharge Goal: Respiratory complications will improve Outcome: Adequate for Discharge Goal: Cardiovascular complication will be avoided Outcome: Adequate for Discharge   Problem: Activity: Goal: Risk for activity intolerance will decrease Outcome: Adequate for Discharge   Problem: Nutrition: Goal: Adequate nutrition will be maintained Outcome: Adequate for Discharge   Problem: Coping: Goal: Level of anxiety will decrease Outcome: Adequate for Discharge   Problem: Elimination: Goal: Will not experience complications related to bowel motility Outcome: Adequate for Discharge Goal: Will not experience complications related to urinary retention Outcome: Adequate for Discharge   Problem: Pain Managment: Goal: General experience of comfort will improve and/or be controlled Outcome: Adequate for Discharge   Problem: Safety: Goal: Ability to remain free from injury will improve Outcome: Adequate for Discharge   Problem: Skin Integrity: Goal: Risk for impaired skin integrity will decrease Outcome: Adequate for Discharge   Problem: Acute Rehab PT Goals(only PT should resolve) Goal: Patient Will Transfer Sit To/From Stand Outcome: Adequate for Discharge Goal: Pt Will Ambulate Outcome: Adequate for Discharge Goal: Pt Will Go Up/Down  Stairs Outcome: Adequate for Discharge   Problem: Acute Rehab PT Goals(only PT should resolve) Goal: Patient Will Transfer Sit To/From Stand Description: With mod Independence, to safely transfer within the home. Outcome: Adequate for Discharge Goal: Pt Will Ambulate Description: With mod [place], >125 ft, with least restrictive AD to safely ambulate within the home. Outcome: Adequate for Discharge Goal: Pt Will Go Up/Down Stairs Description: With mod Independence, using hand rails, for 3-4 steps, with least restrictive AD, to safely enter/exit the home. Outcome: Adequate for Discharge

## 2024-04-19 NOTE — Progress Notes (Signed)
 [facility]    SUBJECTIVE: Patient states she feels reasonably well still has some weakness but would like to go home denies any pain has not been out of bed much.  Has worked with physical therapy somewhat blood pressure is still fluctuating   Vitals:   04/19/24 0031 04/19/24 0341 04/19/24 0758 04/19/24 0906  BP: (!) 154/88 (!) 171/89 (!) 187/82 (!) 141/76  Pulse: 62 64 67   Resp: 20     Temp: 98.3 F (36.8 C) 97.8 F (36.6 C) 98.3 F (36.8 C)   TempSrc:  Oral Oral   SpO2: 93% 96% 97%   Weight:      Height:         Intake/Output Summary (Last 24 hours) at 04/19/2024 9076 Last data filed at 04/19/2024 0759 Gross per 24 hour  Intake 480 ml  Output 1150 ml  Net -670 ml      PHYSICAL EXAM  General: Well developed, well nourished, in no acute distress HEENT:  Normocephalic and atramatic Neck:  No JVD.  Lungs: Clear bilaterally to auscultation and percussion. Heart: HRRR . Normal S1 and S2 without gallops or murmurs.  Abdomen: Bowel sounds are positive, abdomen soft and non-tender  Msk:  Back normal, normal gait. Normal strength and tone for age. Extremities: No clubbing, cyanosis or edema.   Neuro: Alert and oriented X 3. Psych:  Good affect, responds appropriately   LABS: Basic Metabolic Panel: Recent Labs    04/16/24 1739 04/17/24 0440 04/18/24 0338  NA  --  141 141  K  --  3.2* 3.2*  CL  --  108 109  CO2  --  24 24  GLUCOSE  --  105* 106*  BUN  --  19 22  CREATININE  --  1.17 1.41*  CALCIUM   --  8.9 9.4  MG 2.2  --   --   PHOS 3.2  --   --    Liver Function Tests: Recent Labs    04/16/24 1532  AST 25  ALT 19  ALKPHOS 77  BILITOT 1.0  PROT 7.4  ALBUMIN 4.1   No results for input(s): LIPASE, AMYLASE in the last 72 hours. CBC: Recent Labs    04/17/24 0440 04/18/24 0338  WBC 9.1 7.5  NEUTROABS  --  4.5  HGB 13.7 13.4  HCT 41.9 39.8  MCV 84.0 82.7  PLT 206 201   Cardiac Enzymes: Recent Labs    04/16/24 1532  CKTOTAL 163    BNP: Invalid input(s): POCBNP D-Dimer: No results for input(s): DDIMER in the last 72 hours. Hemoglobin A1C: No results for input(s): HGBA1C in the last 72 hours. Fasting Lipid Panel: No results for input(s): CHOL, HDL, LDLCALC, TRIG, CHOLHDL, LDLDIRECT in the last 72 hours. Thyroid Function Tests: Recent Labs    04/18/24 1331  TSH 3.341   Anemia Panel: Recent Labs    04/17/24 0440  VITAMINB12 240    EEG adult Result Date: 04/18/2024 Michaela Aisha SQUIBB, MD     04/18/2024  3:25 PM History: 78 year old male being evaluated for confusion, rule out seizures EEG Duration: 59 minutes Sedation: None Patient State: Awake and asleep Technique: This EEG was acquired with electrodes placed according to the International 10-20 electrode system (including Fp1, Fp2, F3, F4, C3, C4, P3, P4, O1, O2, T3, T4, T5, T6, A1, A2, Fz, Cz, Pz). The following electrodes were missing or displaced: none. Background: The background consists of intermixed alpha and beta activities. There is a well defined posterior dominant  rhythm of nine hz that attenuates with eye opening. Sleep is recorded with normal appearing structures.  There is myogenic artifact on the left consistent with the patient's known left facial Hemi spasm Photic stimulation: Physiologic driving is not performed EEG Abnormalities: None Clinical Interpretation: This normal EEG is recorded in the waking and sleep state. There was no seizure or seizure predisposition recorded on this study. Please note that lack of epileptiform activity on EEG does not preclude the possibility of epilepsy. Aisha Seals, MD [facility] If 7pm- 7am, please page neurology on call as listed in AMION.  ECHOCARDIOGRAM COMPLETE Result Date: 04/18/2024    ECHOCARDIOGRAM REPORT   Patient Name:   Joe Patton. Date of Exam: 04/18/2024 Medical Rec #:  [id number]            Height:       74.0 in Accession #:    [id number]            Weight:       220.0 lb Date of Birth:  01/08/1946            BSA:          2.263 m Patient Age:    77 years             BP:           164/96 mmHg Patient Gender: M                    HR:           71 bpm. Exam Location:  [facility] Procedure: 2D Echo, Color Doppler, Cardiac Doppler and Strain Analysis (Both            Spectral and Color Flow Doppler were utilized during procedure). Indications:     CAD Native Vessel I25.10  History:         Patient has no prior history of Echocardiogram examinations.                  CAD.  Sonographer:     Ashley McNeely-Sloane Referring Phys:  [id number] DORENE COMFORT Diagnosing Phys: Tamsen Reist D Marlena Barbato MD IMPRESSIONS  1. Left ventricular ejection fraction, by estimation, is 55 to 60%. The left ventricle has normal function. The left ventricle has no regional wall motion abnormalities. Left ventricular diastolic parameters are consistent with Grade I diastolic dysfunction (impaired relaxation).  2. Right ventricular systolic function is normal. The right ventricular size is normal.  3. The mitral valve is normal in structure. Trivial mitral valve regurgitation.  4. The aortic valve is normal in structure. Aortic valve regurgitation is trivial. FINDINGS  Left Ventricle: Left ventricular ejection fraction, by estimation, is 55 to 60%. The left ventricle has normal function. The left ventricle has no regional wall motion abnormalities. Strain was performed and the global longitudinal strain is indeterminate. The left ventricular internal cavity size was normal in size. There is no left ventricular hypertrophy. Left ventricular diastolic parameters are consistent with Grade I diastolic dysfunction (impaired relaxation). Right Ventricle: The right ventricular size is normal. No increase in right ventricular wall thickness. Right ventricular systolic function is normal. Left Atrium: Left atrial size was normal in size. Right Atrium: Right atrial size was normal in size. Pericardium: There is  no evidence of pericardial effusion. Mitral Valve: The mitral valve is normal in structure. Trivial mitral valve regurgitation. MV peak gradient, 4.4 mmHg. The mean mitral valve gradient is 1.0 mmHg. Tricuspid Valve: The tricuspid valve  is normal in structure. Tricuspid valve regurgitation is not demonstrated. Aortic Valve: The aortic valve is normal in structure. Aortic valve regurgitation is trivial. Aortic valve mean gradient measures 5.0 mmHg. Aortic valve peak gradient measures 9.2 mmHg. Aortic valve area, by VTI measures 3.34 cm. Pulmonic Valve: The pulmonic valve was normal in structure. Pulmonic valve regurgitation is not visualized. Aorta: The ascending aorta was not well visualized. IAS/Shunts: No atrial level shunt detected by color flow Doppler. Additional Comments: 3D was performed not requiring image post processing on an independent workstation and was indeterminate.  LEFT VENTRICLE PLAX 2D LVIDd:         4.80 cm     Diastology LVIDs:         3.20 cm     LV e' medial:    5.55 cm/s LV PW:         1.00 cm     LV E/e' medial:  10.1 LV IVS:        0.90 cm     LV e' lateral:   8.38 cm/s LVOT diam:     2.30 cm     LV E/e' lateral: 6.7 LV SV:         95 LV SV Index:   42 LVOT Area:     4.15 cm  LV Volumes (MOD) LV vol d, MOD A2C: 90.4 ml LV vol d, MOD A4C: 96.5 ml LV vol s, MOD A2C: 32.0 ml LV vol s, MOD A4C: 48.5 ml LV SV MOD A2C:     58.4 ml LV SV MOD A4C:     96.5 ml LV SV MOD BP:      53.0 ml RIGHT VENTRICLE RV Basal diam:  3.40 cm RV Mid diam:    3.20 cm TAPSE (M-mode): 2.7 cm LEFT ATRIUM           Index        RIGHT ATRIUM           Index LA Vol (A2C): 59.7 ml 26.38 ml/m  RA Area:     13.20 cm LA Vol (A4C): 34.1 ml 15.07 ml/m  RA Volume:   29.30 ml  12.95 ml/m  AORTIC VALVE                     PULMONIC VALVE AV Area (Vmax):    3.47 cm      PV Vmax:       1.00 m/s AV Area (Vmean):   3.50 cm      PV Vmean:      65.800 cm/s AV Area (VTI):     3.34 cm      PV VTI:        0.219 m AV Vmax:            152.00 cm/s   PV Peak grad:  4.0 mmHg AV Vmean:          102.000 cm/s  PV Mean grad:  2.0 mmHg AV VTI:            0.285 m AV Peak Grad:      9.2 mmHg AV Mean Grad:      5.0 mmHg LVOT Vmax:         127.00 cm/s LVOT Vmean:        85.900 cm/s LVOT VTI:          0.229 m LVOT/AV VTI ratio: 0.80  AORTA Ao Root diam: 3.40 cm Ao Asc diam:  3.80 cm MITRAL VALVE  MV Area (PHT): 2.37 cm     SHUNTS MV Area VTI:   4.40 cm     Systemic VTI:  0.23 m MV Peak grad:  4.4 mmHg     Systemic Diam: 2.30 cm MV Mean grad:  1.0 mmHg MV Vmax:       1.05 m/s MV Vmean:      40.0 cm/s MV Decel Time: 320 msec MV E velocity: 56.10 cm/s MV A velocity: 104.00 cm/s MV E/A ratio:  0.54 Ismail Graziani D Eudell Julian MD Electronically signed by Cara JONETTA Lovelace MD Signature Date/Time: 04/18/2024/12:56:54 PM    Final      Echo preserved left ventricular function EF around 55%  TELEMETRY: Normal sinus rhythm right bundle branch block nonspecific ST-T wave changes:  ASSESSMENT AND PLAN:  Principal Problem:   Dizziness Active Problems:   OSA (obstructive sleep apnea)   CAD (coronary artery disease)   Depression with anxiety   Acute kidney injury superimposed on chronic kidney disease (HCC)   Fall at home, initial encounter   Myocardial injury   Hypertensive urgency   Hypokalemia   Peripheral neuropathy   Generalized weakness    Plan Vertigo unclear etiology with generalized weakness recommend neurology evaluation for further assessment Falls lightheaded weakness recommend physical therapy consider rehab Coronary artery disease status post PCI and stent with arthrectomy continue Plavix  statin Labile hypertension currently on labetalol  hydralazine  losartan  continue current therapy blood pressure goal is 130/80 or less Mild obesity recommend modest weight loss exercise portion control Recommend Holter monitoring for 3 to 7 days looking for arrhythmia that could contribute to his lightheaded dizziness or falls Obstructive sleep apnea  continue CPAP weight loss Recommend holding rosuvastatin  for 30 to 90 days to see if that improves his weakness   Cara JONETTA Lovelace, MD 04/19/2024 9:23 AM

## 2024-04-20 DIAGNOSIS — F330 Major depressive disorder, recurrent, mild: Secondary | ICD-10-CM | POA: Diagnosis not present

## 2024-04-20 DIAGNOSIS — I491 Atrial premature depolarization: Secondary | ICD-10-CM | POA: Diagnosis not present

## 2024-04-20 DIAGNOSIS — N401 Enlarged prostate with lower urinary tract symptoms: Secondary | ICD-10-CM | POA: Diagnosis not present

## 2024-04-20 DIAGNOSIS — W19XXXD Unspecified fall, subsequent encounter: Secondary | ICD-10-CM | POA: Diagnosis not present

## 2024-04-20 DIAGNOSIS — M797 Fibromyalgia: Secondary | ICD-10-CM | POA: Diagnosis not present

## 2024-04-20 DIAGNOSIS — M6259 Muscle wasting and atrophy, not elsewhere classified, multiple sites: Secondary | ICD-10-CM | POA: Diagnosis not present

## 2024-04-20 DIAGNOSIS — I951 Orthostatic hypotension: Secondary | ICD-10-CM | POA: Diagnosis not present

## 2024-04-20 DIAGNOSIS — I10 Essential (primary) hypertension: Secondary | ICD-10-CM | POA: Diagnosis not present

## 2024-04-20 DIAGNOSIS — G9340 Encephalopathy, unspecified: Secondary | ICD-10-CM | POA: Diagnosis not present

## 2024-04-20 DIAGNOSIS — G894 Chronic pain syndrome: Secondary | ICD-10-CM | POA: Diagnosis not present

## 2024-04-20 DIAGNOSIS — I160 Hypertensive urgency: Secondary | ICD-10-CM | POA: Diagnosis not present

## 2024-04-20 DIAGNOSIS — G4733 Obstructive sleep apnea (adult) (pediatric): Secondary | ICD-10-CM | POA: Diagnosis not present

## 2024-04-20 DIAGNOSIS — E569 Vitamin deficiency, unspecified: Secondary | ICD-10-CM | POA: Diagnosis not present

## 2024-04-20 DIAGNOSIS — R41841 Cognitive communication deficit: Secondary | ICD-10-CM | POA: Diagnosis not present

## 2024-04-20 DIAGNOSIS — E782 Mixed hyperlipidemia: Secondary | ICD-10-CM | POA: Diagnosis not present

## 2024-04-20 DIAGNOSIS — R42 Dizziness and giddiness: Secondary | ICD-10-CM | POA: Diagnosis not present

## 2024-04-20 DIAGNOSIS — I2510 Atherosclerotic heart disease of native coronary artery without angina pectoris: Secondary | ICD-10-CM | POA: Diagnosis not present

## 2024-04-20 DIAGNOSIS — R7989 Other specified abnormal findings of blood chemistry: Secondary | ICD-10-CM | POA: Diagnosis not present

## 2024-04-20 DIAGNOSIS — E785 Hyperlipidemia, unspecified: Secondary | ICD-10-CM | POA: Diagnosis not present

## 2024-04-20 DIAGNOSIS — N179 Acute kidney failure, unspecified: Secondary | ICD-10-CM | POA: Diagnosis not present

## 2024-04-20 DIAGNOSIS — F321 Major depressive disorder, single episode, moderate: Secondary | ICD-10-CM | POA: Diagnosis not present

## 2024-04-20 DIAGNOSIS — Z736 Limitation of activities due to disability: Secondary | ICD-10-CM | POA: Diagnosis not present

## 2024-04-20 DIAGNOSIS — Z6832 Body mass index (BMI) 32.0-32.9, adult: Secondary | ICD-10-CM | POA: Diagnosis not present

## 2024-04-20 DIAGNOSIS — M62838 Other muscle spasm: Secondary | ICD-10-CM | POA: Diagnosis not present

## 2024-04-20 DIAGNOSIS — I5030 Unspecified diastolic (congestive) heart failure: Secondary | ICD-10-CM | POA: Diagnosis not present

## 2024-04-20 DIAGNOSIS — R2681 Unsteadiness on feet: Secondary | ICD-10-CM | POA: Diagnosis not present

## 2024-04-20 DIAGNOSIS — G629 Polyneuropathy, unspecified: Secondary | ICD-10-CM | POA: Diagnosis not present

## 2024-04-20 DIAGNOSIS — E66811 Obesity, class 1: Secondary | ICD-10-CM | POA: Diagnosis not present

## 2024-04-20 MED ORDER — POLYETHYLENE GLYCOL 3350 17 G PO PACK: 17.0000 g | PACK | Freq: Every day | ORAL | Status: DC

## 2024-04-20 MED ORDER — DOCUSATE SODIUM 100 MG PO CAPS: 100.0000 mg | ORAL_CAPSULE | Freq: Two times a day (BID) | ORAL | Status: DC
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
## 2024-04-20 NOTE — Progress Notes (Signed)
 Patient ID: Joe Patton., male   DOB: 05-13-1946, 78 y.o.   MRN: [id number] [facility] Cardiology    SUBJECTIVE: Patient states he feels reasonably well still has some weakness occasional dizziness on meclizine  blood pressure seems to be reasonably controlled no chest pain feels reasonably well   Vitals:   04/19/24 1448 04/19/24 1930 04/20/24 0400 04/20/24 0758  BP: 121/68 (!) 164/88 (!) 160/84 (!) 157/90  Pulse: 64 63 (!) 54 68  Resp: 18 18  18   Temp: 98.2 F (36.8 C) 97.8 F (36.6 C) (!) 97.5 F (36.4 C) 97.8 F (36.6 C)  TempSrc:   Oral   SpO2: 98% 98% 94% 96%  Weight:      Height:         Intake/Output Summary (Last 24 hours) at 04/20/2024 0957 Last data filed at 04/20/2024 0758 Gross per 24 hour  Intake 200 ml  Output 700 ml  Net -500 ml      PHYSICAL EXAM  General: Well developed, well nourished, in no acute distress HEENT:  Normocephalic and atramatic Neck:  No JVD.  Lungs: Clear bilaterally to auscultation and percussion. Heart: HRRR . Normal S1 and S2 without gallops or murmurs.  Abdomen: Bowel sounds are positive, abdomen soft and non-tender  Msk:  Back normal, normal gait. Normal strength and tone for age. Extremities: No clubbing, cyanosis or edema.   Neuro: Alert and oriented X 3. Psych:  Good affect, responds appropriately   LABS: Basic Metabolic Panel: Recent Labs    04/18/24 0338 04/19/24 0929  NA 141 140  K 3.2* 4.0  CL 109 110  CO2 24 24  GLUCOSE 106* 109*  BUN 22 22  CREATININE 1.41* 1.23  CALCIUM  9.4 9.0   Liver Function Tests: No results for input(s): AST, ALT, ALKPHOS, BILITOT, PROT, ALBUMIN in the last 72 hours. No results for input(s): LIPASE, AMYLASE in the last 72 hours. CBC: Recent Labs    04/18/24 0338 04/19/24 0929  WBC 7.5 7.3  NEUTROABS 4.5 4.7  HGB 13.4 13.3  HCT 39.8 39.6  MCV 82.7 83.9  PLT 201 192   Cardiac Enzymes: No results for input(s): CKTOTAL, CKMB, CKMBINDEX, TROPONINI in the  last 72 hours. BNP: Invalid input(s): POCBNP D-Dimer: No results for input(s): DDIMER in the last 72 hours. Hemoglobin A1C: No results for input(s): HGBA1C in the last 72 hours. Fasting Lipid Panel: No results for input(s): CHOL, HDL, LDLCALC, TRIG, CHOLHDL, LDLDIRECT in the last 72 hours. Thyroid Function Tests: Recent Labs    04/18/24 1331  TSH 3.341   Anemia Panel: No results for input(s): VITAMINB12, FOLATE, FERRITIN, TIBC, IRON, RETICCTPCT in the last 72 hours.  EEG adult Result Date: 04/18/2024 Michaela Aisha SQUIBB, MD     04/18/2024  3:25 PM History: 78 year old male being evaluated for confusion, rule out seizures EEG Duration: 59 minutes Sedation: None Patient State: Awake and asleep Technique: This EEG was acquired with electrodes placed according to the International 10-20 electrode system (including Fp1, Fp2, F3, F4, C3, C4, P3, P4, O1, O2, T3, T4, T5, T6, A1, A2, Fz, Cz, Pz). The following electrodes were missing or displaced: none. Background: The background consists of intermixed alpha and beta activities. There is a well defined posterior dominant rhythm of nine hz that attenuates with eye opening. Sleep is recorded with normal appearing structures.  There is myogenic artifact on the left consistent with the patient's known left facial Hemi spasm Photic stimulation: Physiologic driving is not performed EEG Abnormalities: None Clinical  Interpretation: This normal EEG is recorded in the waking and sleep state. There was no seizure or seizure predisposition recorded on this study. Please note that lack of epileptiform activity on EEG does not preclude the possibility of epilepsy. Aisha Seals, MD [facility] If 7pm- 7am, please page neurology on call as listed in AMION.  ECHOCARDIOGRAM COMPLETE Result Date: 04/18/2024    ECHOCARDIOGRAM REPORT   Patient Name:   Joe Patton. Date of Exam: 04/18/2024 Medical Rec #:  [id number]             Height:       74.0 in Accession #:    [id number]           Weight:       220.0 lb Date of Birth:  10-29-1945            BSA:          2.263 m Patient Age:    77 years             BP:           164/96 mmHg Patient Gender: M                    HR:           71 bpm. Exam Location:  [facility] Procedure: 2D Echo, Color Doppler, Cardiac Doppler and Strain Analysis (Both            Spectral and Color Flow Doppler were utilized during procedure). Indications:     CAD Native Vessel I25.10  History:         Patient has no prior history of Echocardiogram examinations.                  CAD.  Sonographer:     Ashley McNeely-Sloane Referring Phys:  [id number] DORENE COMFORT Diagnosing Phys: Keasha Malkiewicz D Dimples Probus MD IMPRESSIONS  1. Left ventricular ejection fraction, by estimation, is 55 to 60%. The left ventricle has normal function. The left ventricle has no regional wall motion abnormalities. Left ventricular diastolic parameters are consistent with Grade I diastolic dysfunction (impaired relaxation).  2. Right ventricular systolic function is normal. The right ventricular size is normal.  3. The mitral valve is normal in structure. Trivial mitral valve regurgitation.  4. The aortic valve is normal in structure. Aortic valve regurgitation is trivial. FINDINGS  Left Ventricle: Left ventricular ejection fraction, by estimation, is 55 to 60%. The left ventricle has normal function. The left ventricle has no regional wall motion abnormalities. Strain was performed and the global longitudinal strain is indeterminate. The left ventricular internal cavity size was normal in size. There is no left ventricular hypertrophy. Left ventricular diastolic parameters are consistent with Grade I diastolic dysfunction (impaired relaxation). Right Ventricle: The right ventricular size is normal. No increase in right ventricular wall thickness. Right ventricular systolic function is normal. Left Atrium: Left atrial size was normal in size. Right  Atrium: Right atrial size was normal in size. Pericardium: There is no evidence of pericardial effusion. Mitral Valve: The mitral valve is normal in structure. Trivial mitral valve regurgitation. MV peak gradient, 4.4 mmHg. The mean mitral valve gradient is 1.0 mmHg. Tricuspid Valve: The tricuspid valve is normal in structure. Tricuspid valve regurgitation is not demonstrated. Aortic Valve: The aortic valve is normal in structure. Aortic valve regurgitation is trivial. Aortic valve mean gradient measures 5.0 mmHg. Aortic valve peak gradient measures 9.2 mmHg. Aortic valve area, by VTI measures  3.34 cm. Pulmonic Valve: The pulmonic valve was normal in structure. Pulmonic valve regurgitation is not visualized. Aorta: The ascending aorta was not well visualized. IAS/Shunts: No atrial level shunt detected by color flow Doppler. Additional Comments: 3D was performed not requiring image post processing on an independent workstation and was indeterminate.  LEFT VENTRICLE PLAX 2D LVIDd:         4.80 cm     Diastology LVIDs:         3.20 cm     LV e' medial:    5.55 cm/s LV PW:         1.00 cm     LV E/e' medial:  10.1 LV IVS:        0.90 cm     LV e' lateral:   8.38 cm/s LVOT diam:     2.30 cm     LV E/e' lateral: 6.7 LV SV:         95 LV SV Index:   42 LVOT Area:     4.15 cm  LV Volumes (MOD) LV vol d, MOD A2C: 90.4 ml LV vol d, MOD A4C: 96.5 ml LV vol s, MOD A2C: 32.0 ml LV vol s, MOD A4C: 48.5 ml LV SV MOD A2C:     58.4 ml LV SV MOD A4C:     96.5 ml LV SV MOD BP:      53.0 ml RIGHT VENTRICLE RV Basal diam:  3.40 cm RV Mid diam:    3.20 cm TAPSE (M-mode): 2.7 cm LEFT ATRIUM           Index        RIGHT ATRIUM           Index LA Vol (A2C): 59.7 ml 26.38 ml/m  RA Area:     13.20 cm LA Vol (A4C): 34.1 ml 15.07 ml/m  RA Volume:   29.30 ml  12.95 ml/m  AORTIC VALVE                     PULMONIC VALVE AV Area (Vmax):    3.47 cm      PV Vmax:       1.00 m/s AV Area (Vmean):   3.50 cm      PV Vmean:      65.800 cm/s AV  Area (VTI):     3.34 cm      PV VTI:        0.219 m AV Vmax:           152.00 cm/s   PV Peak grad:  4.0 mmHg AV Vmean:          102.000 cm/s  PV Mean grad:  2.0 mmHg AV VTI:            0.285 m AV Peak Grad:      9.2 mmHg AV Mean Grad:      5.0 mmHg LVOT Vmax:         127.00 cm/s LVOT Vmean:        85.900 cm/s LVOT VTI:          0.229 m LVOT/AV VTI ratio: 0.80  AORTA Ao Root diam: 3.40 cm Ao Asc diam:  3.80 cm MITRAL VALVE MV Area (PHT): 2.37 cm     SHUNTS MV Area VTI:   4.40 cm     Systemic VTI:  0.23 m MV Peak grad:  4.4 mmHg     Systemic Diam: 2.30 cm MV Mean grad:  1.0 mmHg MV Vmax:       1.05 m/s MV Vmean:      40.0 cm/s MV Decel Time: 320 msec MV E velocity: 56.10 cm/s MV A velocity: 104.00 cm/s MV E/A ratio:  0.54 Letcher Schweikert D Evelena Masci MD Electronically signed by Cara JONETTA Lovelace MD Signature Date/Time: 04/18/2024/12:56:54 PM    Final      Echo preserved left ventricular function EF around 55%  TELEMETRY: Normal sinus rhythm rate of 75:  ASSESSMENT AND PLAN:  Principal Problem:   Dizziness Active Problems:   OSA (obstructive sleep apnea)   CAD (coronary artery disease)   Depression with anxiety   Acute kidney injury superimposed on chronic kidney disease (HCC)   Fall at home, initial encounter   Myocardial injury   Hypertensive urgency   Hypokalemia   Peripheral neuropathy   Generalized weakness    Plan  Vertigo dizziness somewhat improved unclear etiology recommend stricter blood pressure management to control Coronary artery disease including multivessel PCI and stent to [facility] continue current therapy aspirin  Plavix  statin blood pressure control Hyperlipidemia continue statin therapy for lipid management Obstructive sleep apnea recommend sleep study CPAP weight loss Peripheral neuropathy unclear etiology follow-up with neurology consider gabapentin  or Lyrica Generalized weakness recommend the patient have physical therapy at a skilled nursing facility prior to discharge  home Acute on chronic renal insufficiency maintain adequate hydration can continue blood pressure control Elevated troponins probably demand ischemia this does not represent a non-STEMI Falls possibly related to generalized weakness and neuropathy recommend physical therapy balance and strength training Agree with rehab stay at that skilled nursing facility  Cara JONETTA Lovelace, MD 04/20/2024 9:57 AM

## 2024-04-20 NOTE — Plan of Care (Signed)
 Patient has reported having generalized upper back discomfort earlier in current shift.  Patient has received prn pain medication to assist with symptom management.  Currently resting without noted signs of associated symptoms.  No additional medical interventions required at this time.  Patient to continue to be monitored by hospital staff until discharge.

## 2024-04-20 NOTE — TOC Transition Note (Signed)
 Transition of Care [facility]) - Discharge Note   Patient Details  Name: Joe Patton. MRN: [id number] Date of Birth: Aug 17, 1946  [facility] of [facility] CM/SW Contact:  Marinda Cooks, RN Phone Number: 04/20/2024, 1:32 PM   Clinical Narrative:    This CM updated by covering MD pt medically cleared to dc today and has active DC order . This CM spoke with  Admission liaison @ [facility]  and confirmed bed offer. DC transportation confirmed for pt with son . Pt's son [facility] informed  that facility requested that he provide pt's C-pap machine to facility and he verbalized understanding. Medical team updated . No additional DC needs requested by medical team or identified by CM at this time .     Final next level of care: Skilled Nursing Facility Barriers to Discharge: No Barriers Identified   Patient Goals and CMS Choice Patient states their goals for this hospitalization and ongoing recovery are:: Rehab CMS Medicare.gov Compare Post Acute Care list provided to:: Other (Comment Required) (Pt, Son,& wife) Choice offered to / list presented to : Patient, Adult Children      Discharge Placement                  Name of family member notified: Son Patient and family notified of of transfer: 04/20/24  Discharge Plan and Services Additional resources added to the After Visit Summary for     Discharge Planning Services: CM Consult               Social Drivers of Health (SDOH) Interventions SDOH Screenings   Food Insecurity: No Food Insecurity (04/17/2024)  Housing: Low Risk  (04/17/2024)  Transportation Needs: No Transportation Needs (04/17/2024)  Utilities: Not At Risk (04/17/2024)  Alcohol Screen: Low Risk  (02/15/2022)  Depression (PHQ2-9): Low Risk  (06/25/2023)  Financial Resource Strain: Low Risk  (01/22/2024)   Received from [facility]  Physical Activity: Insufficiently Active (02/15/2022)  Social Connections: Socially Integrated  (04/17/2024)  Stress: No Stress Concern Present (02/15/2022)  Tobacco Use: Medium Risk (04/16/2024)     Readmission Risk Interventions     No data to display

## 2024-04-21 DIAGNOSIS — M6259 Muscle wasting and atrophy, not elsewhere classified, multiple sites: Secondary | ICD-10-CM | POA: Diagnosis not present

## 2024-04-21 LAB — METHYLMALONIC ACID, SERUM: Methylmalonic Acid, Quantitative: 258 nmol/L (ref 0–378)

## 2024-04-22 DIAGNOSIS — I10 Essential (primary) hypertension: Secondary | ICD-10-CM | POA: Diagnosis not present

## 2024-04-22 DIAGNOSIS — M6259 Muscle wasting and atrophy, not elsewhere classified, multiple sites: Secondary | ICD-10-CM | POA: Diagnosis not present

## 2024-04-23 DIAGNOSIS — E66811 Obesity, class 1: Secondary | ICD-10-CM | POA: Diagnosis not present

## 2024-04-23 DIAGNOSIS — E782 Mixed hyperlipidemia: Secondary | ICD-10-CM | POA: Diagnosis not present

## 2024-04-23 DIAGNOSIS — I2510 Atherosclerotic heart disease of native coronary artery without angina pectoris: Secondary | ICD-10-CM | POA: Diagnosis not present

## 2024-04-23 DIAGNOSIS — I10 Essential (primary) hypertension: Secondary | ICD-10-CM | POA: Diagnosis not present

## 2024-04-23 DIAGNOSIS — Z6832 Body mass index (BMI) 32.0-32.9, adult: Secondary | ICD-10-CM | POA: Diagnosis not present

## 2024-04-23 DIAGNOSIS — G894 Chronic pain syndrome: Secondary | ICD-10-CM | POA: Diagnosis not present

## 2024-04-23 DIAGNOSIS — G4733 Obstructive sleep apnea (adult) (pediatric): Secondary | ICD-10-CM | POA: Diagnosis not present

## 2024-04-24 DIAGNOSIS — I2510 Atherosclerotic heart disease of native coronary artery without angina pectoris: Secondary | ICD-10-CM | POA: Diagnosis not present

## 2024-04-24 DIAGNOSIS — I10 Essential (primary) hypertension: Secondary | ICD-10-CM | POA: Diagnosis not present

## 2024-04-24 DIAGNOSIS — I951 Orthostatic hypotension: Secondary | ICD-10-CM | POA: Diagnosis not present

## 2024-04-24 DIAGNOSIS — F330 Major depressive disorder, recurrent, mild: Secondary | ICD-10-CM | POA: Diagnosis not present

## 2024-04-25 DIAGNOSIS — N179 Acute kidney failure, unspecified: Secondary | ICD-10-CM | POA: Diagnosis not present

## 2024-04-25 DIAGNOSIS — I10 Essential (primary) hypertension: Secondary | ICD-10-CM | POA: Diagnosis not present

## 2024-04-28 ENCOUNTER — Ambulatory Visit (INDEPENDENT_AMBULATORY_CARE_PROVIDER_SITE_OTHER): Admitting: Sleep Medicine

## 2024-04-28 ENCOUNTER — Telehealth: Payer: Self-pay | Admitting: Urology

## 2024-04-28 ENCOUNTER — Encounter: Payer: Self-pay | Admitting: Sleep Medicine

## 2024-04-28 VITALS — BP 110/70 | HR 57 | Temp 98.0°F | Ht 74.0 in | Wt 220.0 lb

## 2024-04-28 DIAGNOSIS — F321 Major depressive disorder, single episode, moderate: Secondary | ICD-10-CM | POA: Diagnosis not present

## 2024-04-28 DIAGNOSIS — I10 Essential (primary) hypertension: Secondary | ICD-10-CM

## 2024-04-28 DIAGNOSIS — G4733 Obstructive sleep apnea (adult) (pediatric): Secondary | ICD-10-CM | POA: Diagnosis not present

## 2024-04-28 NOTE — Patient Instructions (Addendum)
 SABRA

## 2024-04-28 NOTE — Progress Notes (Signed)
 Name:Joe Patton. MRN: [id number] DOB: 11/28/1945   CHIEF COMPLAINT:  HST F/U   HISTORY OF PRESENT ILLNESS:  Joe Patton is a 78 y.o. w/ a h/o OSA, HTN and CAD who presents to follow up on HST results. The patient underwent HST which revealed severe OSA (AHI 48, O2 nadir 85%).    EPWORTH SLEEP SCORE    01/28/2024   10:00 AM  Results of the Epworth flowsheet  Sitting and reading 0  Watching TV 0  Sitting, inactive in a public place (e.g. a theatre or a meeting) 0  As a passenger in a car for an hour without a break 2  Lying down to rest in the afternoon when circumstances permit 0  Sitting and talking to someone 0  Sitting quietly after a lunch without alcohol 0  In a car, while stopped for a few minutes in traffic 0  Total score 2    PAST MEDICAL HISTORY :   has a past medical history of Actinic keratosis, Allergic rhinitis, Anxiety, Arthritis, Bilirubinuria, BPH (benign prostatic hyperplasia), Degeneration, intervertebral disc, lumbosacral, Erectile dysfunction, Eustachian tube dysfunction, Fibromyalgia, GERD (gastroesophageal reflux disease), Heart murmur, Hypertension, Osteoarthrosis, Sinusitis, Sleep apnea, and Viral warts.  has a past surgical history that includes Cataract extraction; Gallbladder surgery; Hip surgery; Tonsillectomy; Eye surgery (Right); Cholecystectomy (2005); Joint replacement; Total shoulder arthroplasty (Left, 09/03/2014); Colonoscopy (2014); Cystoscopy with litholapaxy (N/A, 02/15/2021); and LEFT HEART CATH AND CORONARY ANGIOGRAPHY (Left, 11/14/2023). Prior to Admission medications   Medication Sig Start Date End Date Taking? Authorizing Provider  amLODipine  (NORVASC ) 10 MG tablet Take 10 mg by mouth daily. 04/02/23  Yes [provider]  ascorbic acid (VITAMIN C) 500 MG tablet Take 500 mg by mouth daily.   Yes [provider]  aspirin  EC 81 MG tablet Take 81 mg by mouth daily. 12/29/23 12/28/24 Yes [provider]   cholecalciferol (VITAMIN D3) 25 MCG (1000 UNIT) tablet Take 1,000 Units by mouth daily.   Yes [provider]  citalopram  (CELEXA ) 20 MG tablet Take 1 tablet (20 mg total) by mouth daily. 12/14/23  Yes Joshua Cathryne BROCKS, MD  clopidogrel  (PLAVIX ) 75 MG tablet Take 75 mg by mouth daily. 12/29/23 12/28/24 Yes [provider]  docusate sodium  (COLACE) 100 MG capsule Take 1 capsule (100 mg total) by mouth 2 (two) times daily. 04/20/24  Yes Barbarann Nest, MD  finasteride  (PROSCAR ) 5 MG tablet Take 1 tablet (5 mg total) by mouth daily. 04/03/24  Yes Stoioff, Glendia BROCKS, MD  gabapentin  (NEURONTIN ) 100 MG capsule Take 1 capsule (100 mg total) by mouth at bedtime. 04/18/24  Yes Barbarann Nest, MD  hydrALAZINE  (APRESOLINE ) 50 MG tablet Take 1 tablet (50 mg total) by mouth every 8 (eight) hours as needed (systolic blood pressure >160). 04/18/24  Yes Barbarann Nest, MD  labetalol  (NORMODYNE ) 100 MG tablet Take 100 mg by mouth 2 (two) times daily.   Yes [provider]  losartan  (COZAAR ) 100 MG tablet Take 100 mg by mouth daily.   Yes [provider]  meclizine  (ANTIVERT ) 25 MG tablet Take 1 tablet (25 mg total) by mouth 3 (three) times daily as needed for dizziness. 04/18/24  Yes Barbarann Nest, MD  oxyCODONE  (OXY IR/ROXICODONE ) 5 MG immediate release tablet Take 1 tablet (5 mg total) by mouth every 4 (four) hours as needed for severe pain (pain score 7-10). 04/19/24  Yes Barbarann Nest, MD  polyethylene glycol (MIRALAX  / GLYCOLAX ) 17 g packet Take  17 g by mouth daily. 04/21/24  Yes Barbarann Nest, MD  rosuvastatin  (CRESTOR ) 20 MG tablet Take 20 mg by mouth daily. 12/28/23 12/27/24 Yes [provider]  sildenafil  (REVATIO ) 20 MG tablet TAKE 2-5 TABLETS 1 HOUR PRIOR TO INTERCOURSE Patient taking differently: Take 20 mg by mouth daily as needed. TAKE 2-5 TABLETS 1 HOUR PRIOR TO INTERCOURSE 08/28/23  Yes Stoioff, Glendia BROCKS, MD  solifenacin  (VESICARE ) 10 MG tablet Take 1 tablet (10 mg  total) by mouth daily. 01/30/24  Yes Stoioff, Glendia BROCKS, MD  vitamin B-12 (CYANOCOBALAMIN ) 100 MCG tablet Take 1 tablet (100 mcg total) by mouth daily. 04/18/24 04/18/25 Yes Barbarann Nest, MD   Allergies  Allergen Reactions   Tramadol      Anger issues   Latex Rash    FAMILY HISTORY:  family history includes Breast cancer in his mother; Heart attack in his father; Hypertension in his father; Stroke in his father. SOCIAL HISTORY:  reports that he quit smoking about 16 years ago. His smoking use included cigarettes. He started smoking about 36 years ago. He has a 30 pack-year smoking history. He has never used smokeless tobacco. He reports that he does not drink alcohol and does not use drugs.   Review of Systems:  Gen:  Denies  fever, sweats, chills weight loss  HEENT: Denies blurred vision, double vision, ear pain, eye pain, hearing loss, nose bleeds, sore throat Cardiac:  No dizziness, chest pain or heaviness, chest tightness,edema, No JVD Resp:   No cough, -sputum production, -shortness of breath,-wheezing, -hemoptysis,  Gi: Denies swallowing difficulty, stomach pain, nausea or vomiting, diarrhea, constipation, bowel incontinence Gu:  Denies bladder incontinence, burning urine Ext:   Denies Joint pain, stiffness or swelling Skin: Denies  skin rash, easy bruising or bleeding or hives Endoc:  Denies polyuria, polydipsia , polyphagia or weight change Psych:   Denies depression, insomnia or hallucinations  Other:  All other systems negative  VITAL SIGNS: BP 110/70 (BP Location: Right Arm, Patient Position: Sitting, Cuff Size: Large)   Pulse (!) 57   Temp 98 F (36.7 C) (Oral)   Ht 6' 2 (1.88 m)   Wt 220 lb (99.8 kg)   SpO2 96%   BMI 28.25 kg/m    Physical Examination:   General Appearance: No distress  EYES PERRLA, EOM intact.   NECK Supple, No JVD Pulmonary: normal breath sounds, No wheezing.  CardiovascularNormal S1,S2.  No m/r/g.   Abdomen: Benign, Soft,  non-tender. Skin:   warm, no rashes, no ecchymosis  Extremities: normal, no cyanosis, clubbing. Neuro:without focal findings,  speech normal  PSYCHIATRIC: Mood, affect within normal limits.   ASSESSMENT AND PLAN  OSA Reviewed HST results with patient. Starting on APAP therapy set to 4-16 cm H2O. Discussed the consequences of untreated sleep apnea. Advised not to drive drowsy for safety of patient and others. Will follow up in 3 months.     HTN Stable, on current management. Following with PCP.    Patient  satisfied with Plan of action and management. All questions answered  I spent a total of 33 minutes reviewing chart data, face-to-face evaluation with the patient, counseling and coordination of care as detailed above.    Anusha Claus, M.D.  Sleep Medicine [facility]

## 2024-04-28 NOTE — Telephone Encounter (Signed)
Urine cytology showed no abnormal cells 

## 2024-04-29 DIAGNOSIS — I491 Atrial premature depolarization: Secondary | ICD-10-CM | POA: Diagnosis not present

## 2024-04-30 DIAGNOSIS — I10 Essential (primary) hypertension: Secondary | ICD-10-CM | POA: Diagnosis not present

## 2024-04-30 NOTE — Telephone Encounter (Signed)
 Called patient and let them know that their cytology results were normal

## 2024-05-02 ENCOUNTER — Telehealth: Payer: Self-pay | Admitting: General Practice

## 2024-05-02 NOTE — Telephone Encounter (Signed)
 Copied from CRM #[id number]. Topic: General - Other >> May 02, 2024  9:52 AM Zebedee SAUNDERS wrote: Reason for CRM: Received call from [facility] per Darice ph: [phone] needs to know who is pt PCP since Cathryne Molt is no longer PCP. Darice would like to continue care. Please call Darice [phone]

## 2024-05-04 NOTE — Procedures (Signed)
 7-day Holter Indication PACs palpitations Hookup date 7/ 17-24 2025 Patient wore the monitor for about 1 week  Total beats 289,909 Minimum rate 45 maximum 139 average 64 Mostly sinus rhythm Rare PVCs Frequent multiple PACs 15% burden Nonsustained runs No sustained pauses No high-grade blocks No ST segment changes No diary submitted No evidence of atrial fibrillation  Conclusion Benign Holter except for multiple frequent PACs Consider calcium  or beta-blocker to help with suppression Correct all electrolytes Consider EP evaluation for possible ablation

## 2024-05-08 DIAGNOSIS — G629 Polyneuropathy, unspecified: Secondary | ICD-10-CM | POA: Diagnosis not present

## 2024-05-08 DIAGNOSIS — G894 Chronic pain syndrome: Secondary | ICD-10-CM | POA: Diagnosis not present

## 2024-05-08 DIAGNOSIS — I951 Orthostatic hypotension: Secondary | ICD-10-CM | POA: Diagnosis not present

## 2024-05-08 DIAGNOSIS — K5900 Constipation, unspecified: Secondary | ICD-10-CM | POA: Diagnosis not present

## 2024-05-08 DIAGNOSIS — I130 Hypertensive heart and chronic kidney disease with heart failure and stage 1 through stage 4 chronic kidney disease, or unspecified chronic kidney disease: Secondary | ICD-10-CM | POA: Diagnosis not present

## 2024-05-08 DIAGNOSIS — Z7902 Long term (current) use of antithrombotics/antiplatelets: Secondary | ICD-10-CM | POA: Diagnosis not present

## 2024-05-08 DIAGNOSIS — F419 Anxiety disorder, unspecified: Secondary | ICD-10-CM | POA: Diagnosis not present

## 2024-05-08 DIAGNOSIS — I2510 Atherosclerotic heart disease of native coronary artery without angina pectoris: Secondary | ICD-10-CM | POA: Diagnosis not present

## 2024-05-08 DIAGNOSIS — Z7982 Long term (current) use of aspirin: Secondary | ICD-10-CM | POA: Diagnosis not present

## 2024-05-08 DIAGNOSIS — N400 Enlarged prostate without lower urinary tract symptoms: Secondary | ICD-10-CM | POA: Diagnosis not present

## 2024-05-08 DIAGNOSIS — G4733 Obstructive sleep apnea (adult) (pediatric): Secondary | ICD-10-CM | POA: Diagnosis not present

## 2024-05-08 DIAGNOSIS — I5030 Unspecified diastolic (congestive) heart failure: Secondary | ICD-10-CM | POA: Diagnosis not present

## 2024-05-08 DIAGNOSIS — E569 Vitamin deficiency, unspecified: Secondary | ICD-10-CM | POA: Diagnosis not present

## 2024-05-08 DIAGNOSIS — F330 Major depressive disorder, recurrent, mild: Secondary | ICD-10-CM | POA: Diagnosis not present

## 2024-05-08 DIAGNOSIS — Z955 Presence of coronary angioplasty implant and graft: Secondary | ICD-10-CM | POA: Diagnosis not present

## 2024-05-08 DIAGNOSIS — N189 Chronic kidney disease, unspecified: Secondary | ICD-10-CM | POA: Diagnosis not present

## 2024-05-08 DIAGNOSIS — M797 Fibromyalgia: Secondary | ICD-10-CM | POA: Diagnosis not present

## 2024-05-08 DIAGNOSIS — Z9181 History of falling: Secondary | ICD-10-CM | POA: Diagnosis not present

## 2024-05-08 DIAGNOSIS — G9340 Encephalopathy, unspecified: Secondary | ICD-10-CM | POA: Diagnosis not present

## 2024-05-13 DIAGNOSIS — N189 Chronic kidney disease, unspecified: Secondary | ICD-10-CM | POA: Diagnosis not present

## 2024-05-13 DIAGNOSIS — I2510 Atherosclerotic heart disease of native coronary artery without angina pectoris: Secondary | ICD-10-CM | POA: Diagnosis not present

## 2024-05-13 DIAGNOSIS — I130 Hypertensive heart and chronic kidney disease with heart failure and stage 1 through stage 4 chronic kidney disease, or unspecified chronic kidney disease: Secondary | ICD-10-CM | POA: Diagnosis not present

## 2024-05-13 DIAGNOSIS — G9340 Encephalopathy, unspecified: Secondary | ICD-10-CM | POA: Diagnosis not present

## 2024-05-13 DIAGNOSIS — I951 Orthostatic hypotension: Secondary | ICD-10-CM | POA: Diagnosis not present

## 2024-05-13 DIAGNOSIS — I5030 Unspecified diastolic (congestive) heart failure: Secondary | ICD-10-CM | POA: Diagnosis not present

## 2024-05-21 DIAGNOSIS — G9340 Encephalopathy, unspecified: Secondary | ICD-10-CM | POA: Diagnosis not present

## 2024-05-21 DIAGNOSIS — I5030 Unspecified diastolic (congestive) heart failure: Secondary | ICD-10-CM | POA: Diagnosis not present

## 2024-05-21 DIAGNOSIS — I130 Hypertensive heart and chronic kidney disease with heart failure and stage 1 through stage 4 chronic kidney disease, or unspecified chronic kidney disease: Secondary | ICD-10-CM | POA: Diagnosis not present

## 2024-05-21 DIAGNOSIS — I951 Orthostatic hypotension: Secondary | ICD-10-CM | POA: Diagnosis not present

## 2024-05-21 DIAGNOSIS — I2510 Atherosclerotic heart disease of native coronary artery without angina pectoris: Secondary | ICD-10-CM | POA: Diagnosis not present

## 2024-05-21 DIAGNOSIS — N189 Chronic kidney disease, unspecified: Secondary | ICD-10-CM | POA: Diagnosis not present

## 2024-05-26 DIAGNOSIS — I951 Orthostatic hypotension: Secondary | ICD-10-CM | POA: Diagnosis not present

## 2024-05-26 DIAGNOSIS — N189 Chronic kidney disease, unspecified: Secondary | ICD-10-CM | POA: Diagnosis not present

## 2024-05-26 DIAGNOSIS — I130 Hypertensive heart and chronic kidney disease with heart failure and stage 1 through stage 4 chronic kidney disease, or unspecified chronic kidney disease: Secondary | ICD-10-CM | POA: Diagnosis not present

## 2024-05-26 DIAGNOSIS — I5030 Unspecified diastolic (congestive) heart failure: Secondary | ICD-10-CM | POA: Diagnosis not present

## 2024-05-26 DIAGNOSIS — G9340 Encephalopathy, unspecified: Secondary | ICD-10-CM | POA: Diagnosis not present

## 2024-05-26 DIAGNOSIS — I2510 Atherosclerotic heart disease of native coronary artery without angina pectoris: Secondary | ICD-10-CM | POA: Diagnosis not present

## 2024-05-27 DIAGNOSIS — I5030 Unspecified diastolic (congestive) heart failure: Secondary | ICD-10-CM | POA: Diagnosis not present

## 2024-05-27 DIAGNOSIS — I130 Hypertensive heart and chronic kidney disease with heart failure and stage 1 through stage 4 chronic kidney disease, or unspecified chronic kidney disease: Secondary | ICD-10-CM | POA: Diagnosis not present

## 2024-05-27 DIAGNOSIS — N189 Chronic kidney disease, unspecified: Secondary | ICD-10-CM | POA: Diagnosis not present

## 2024-05-27 DIAGNOSIS — I951 Orthostatic hypotension: Secondary | ICD-10-CM | POA: Diagnosis not present

## 2024-05-27 DIAGNOSIS — I2510 Atherosclerotic heart disease of native coronary artery without angina pectoris: Secondary | ICD-10-CM | POA: Diagnosis not present

## 2024-05-27 DIAGNOSIS — G9340 Encephalopathy, unspecified: Secondary | ICD-10-CM | POA: Diagnosis not present

## 2024-05-30 DIAGNOSIS — I10 Essential (primary) hypertension: Secondary | ICD-10-CM | POA: Diagnosis not present

## 2024-05-30 DIAGNOSIS — Z7185 Encounter for immunization safety counseling: Secondary | ICD-10-CM | POA: Diagnosis not present

## 2024-05-30 DIAGNOSIS — R350 Frequency of micturition: Secondary | ICD-10-CM | POA: Diagnosis not present

## 2024-05-30 DIAGNOSIS — N401 Enlarged prostate with lower urinary tract symptoms: Secondary | ICD-10-CM | POA: Diagnosis not present

## 2024-05-30 DIAGNOSIS — F3340 Major depressive disorder, recurrent, in remission, unspecified: Secondary | ICD-10-CM | POA: Diagnosis not present

## 2024-06-02 DIAGNOSIS — I130 Hypertensive heart and chronic kidney disease with heart failure and stage 1 through stage 4 chronic kidney disease, or unspecified chronic kidney disease: Secondary | ICD-10-CM | POA: Diagnosis not present

## 2024-06-02 DIAGNOSIS — N189 Chronic kidney disease, unspecified: Secondary | ICD-10-CM | POA: Diagnosis not present

## 2024-06-02 DIAGNOSIS — I951 Orthostatic hypotension: Secondary | ICD-10-CM | POA: Diagnosis not present

## 2024-06-02 DIAGNOSIS — G9340 Encephalopathy, unspecified: Secondary | ICD-10-CM | POA: Diagnosis not present

## 2024-06-02 DIAGNOSIS — I5030 Unspecified diastolic (congestive) heart failure: Secondary | ICD-10-CM | POA: Diagnosis not present

## 2024-06-02 DIAGNOSIS — I2510 Atherosclerotic heart disease of native coronary artery without angina pectoris: Secondary | ICD-10-CM | POA: Diagnosis not present

## 2024-06-04 DIAGNOSIS — E66811 Obesity, class 1: Secondary | ICD-10-CM | POA: Diagnosis not present

## 2024-06-04 DIAGNOSIS — F32A Depression, unspecified: Secondary | ICD-10-CM | POA: Diagnosis not present

## 2024-06-04 DIAGNOSIS — I2510 Atherosclerotic heart disease of native coronary artery without angina pectoris: Secondary | ICD-10-CM | POA: Diagnosis not present

## 2024-06-04 DIAGNOSIS — R011 Cardiac murmur, unspecified: Secondary | ICD-10-CM | POA: Diagnosis not present

## 2024-06-04 DIAGNOSIS — E782 Mixed hyperlipidemia: Secondary | ICD-10-CM | POA: Diagnosis not present

## 2024-06-04 DIAGNOSIS — Z6832 Body mass index (BMI) 32.0-32.9, adult: Secondary | ICD-10-CM | POA: Diagnosis not present

## 2024-06-04 DIAGNOSIS — I10 Essential (primary) hypertension: Secondary | ICD-10-CM | POA: Diagnosis not present

## 2024-06-04 DIAGNOSIS — G4733 Obstructive sleep apnea (adult) (pediatric): Secondary | ICD-10-CM | POA: Diagnosis not present

## 2024-06-07 DIAGNOSIS — G629 Polyneuropathy, unspecified: Secondary | ICD-10-CM | POA: Diagnosis not present

## 2024-06-07 DIAGNOSIS — F330 Major depressive disorder, recurrent, mild: Secondary | ICD-10-CM | POA: Diagnosis not present

## 2024-06-07 DIAGNOSIS — K5900 Constipation, unspecified: Secondary | ICD-10-CM | POA: Diagnosis not present

## 2024-06-07 DIAGNOSIS — Z955 Presence of coronary angioplasty implant and graft: Secondary | ICD-10-CM | POA: Diagnosis not present

## 2024-06-07 DIAGNOSIS — I5030 Unspecified diastolic (congestive) heart failure: Secondary | ICD-10-CM | POA: Diagnosis not present

## 2024-06-07 DIAGNOSIS — F419 Anxiety disorder, unspecified: Secondary | ICD-10-CM | POA: Diagnosis not present

## 2024-06-07 DIAGNOSIS — I130 Hypertensive heart and chronic kidney disease with heart failure and stage 1 through stage 4 chronic kidney disease, or unspecified chronic kidney disease: Secondary | ICD-10-CM | POA: Diagnosis not present

## 2024-06-07 DIAGNOSIS — G4733 Obstructive sleep apnea (adult) (pediatric): Secondary | ICD-10-CM | POA: Diagnosis not present

## 2024-06-07 DIAGNOSIS — M797 Fibromyalgia: Secondary | ICD-10-CM | POA: Diagnosis not present

## 2024-06-07 DIAGNOSIS — N400 Enlarged prostate without lower urinary tract symptoms: Secondary | ICD-10-CM | POA: Diagnosis not present

## 2024-06-07 DIAGNOSIS — I951 Orthostatic hypotension: Secondary | ICD-10-CM | POA: Diagnosis not present

## 2024-06-07 DIAGNOSIS — G9340 Encephalopathy, unspecified: Secondary | ICD-10-CM | POA: Diagnosis not present

## 2024-06-07 DIAGNOSIS — Z7902 Long term (current) use of antithrombotics/antiplatelets: Secondary | ICD-10-CM | POA: Diagnosis not present

## 2024-06-07 DIAGNOSIS — I2510 Atherosclerotic heart disease of native coronary artery without angina pectoris: Secondary | ICD-10-CM | POA: Diagnosis not present

## 2024-06-07 DIAGNOSIS — G894 Chronic pain syndrome: Secondary | ICD-10-CM | POA: Diagnosis not present

## 2024-06-07 DIAGNOSIS — E569 Vitamin deficiency, unspecified: Secondary | ICD-10-CM | POA: Diagnosis not present

## 2024-06-07 DIAGNOSIS — N189 Chronic kidney disease, unspecified: Secondary | ICD-10-CM | POA: Diagnosis not present

## 2024-06-07 DIAGNOSIS — Z9181 History of falling: Secondary | ICD-10-CM | POA: Diagnosis not present

## 2024-06-07 DIAGNOSIS — Z7982 Long term (current) use of aspirin: Secondary | ICD-10-CM | POA: Diagnosis not present

## 2024-06-10 DIAGNOSIS — N189 Chronic kidney disease, unspecified: Secondary | ICD-10-CM | POA: Diagnosis not present

## 2024-06-10 DIAGNOSIS — G9340 Encephalopathy, unspecified: Secondary | ICD-10-CM | POA: Diagnosis not present

## 2024-06-10 DIAGNOSIS — I951 Orthostatic hypotension: Secondary | ICD-10-CM | POA: Diagnosis not present

## 2024-06-10 DIAGNOSIS — I130 Hypertensive heart and chronic kidney disease with heart failure and stage 1 through stage 4 chronic kidney disease, or unspecified chronic kidney disease: Secondary | ICD-10-CM | POA: Diagnosis not present

## 2024-06-10 DIAGNOSIS — I5030 Unspecified diastolic (congestive) heart failure: Secondary | ICD-10-CM | POA: Diagnosis not present

## 2024-06-10 DIAGNOSIS — I2510 Atherosclerotic heart disease of native coronary artery without angina pectoris: Secondary | ICD-10-CM | POA: Diagnosis not present

## 2024-06-23 DIAGNOSIS — I160 Hypertensive urgency: Secondary | ICD-10-CM | POA: Diagnosis not present

## 2024-06-23 DIAGNOSIS — F418 Other specified anxiety disorders: Secondary | ICD-10-CM | POA: Diagnosis not present

## 2024-06-23 DIAGNOSIS — I5032 Chronic diastolic (congestive) heart failure: Secondary | ICD-10-CM | POA: Diagnosis not present

## 2024-06-23 DIAGNOSIS — R5381 Other malaise: Secondary | ICD-10-CM | POA: Diagnosis not present

## 2024-06-23 DIAGNOSIS — G4733 Obstructive sleep apnea (adult) (pediatric): Secondary | ICD-10-CM | POA: Diagnosis not present

## 2024-06-23 DIAGNOSIS — Z7902 Long term (current) use of antithrombotics/antiplatelets: Secondary | ICD-10-CM | POA: Diagnosis not present

## 2024-06-23 DIAGNOSIS — I2510 Atherosclerotic heart disease of native coronary artery without angina pectoris: Secondary | ICD-10-CM | POA: Diagnosis not present

## 2024-06-23 DIAGNOSIS — M5459 Other low back pain: Secondary | ICD-10-CM | POA: Diagnosis not present

## 2024-06-23 DIAGNOSIS — R2681 Unsteadiness on feet: Secondary | ICD-10-CM | POA: Diagnosis not present

## 2024-06-23 DIAGNOSIS — Z87891 Personal history of nicotine dependence: Secondary | ICD-10-CM | POA: Diagnosis not present

## 2024-06-23 DIAGNOSIS — R531 Weakness: Secondary | ICD-10-CM | POA: Diagnosis not present

## 2024-06-23 DIAGNOSIS — G894 Chronic pain syndrome: Secondary | ICD-10-CM | POA: Diagnosis not present

## 2024-06-23 DIAGNOSIS — N182 Chronic kidney disease, stage 2 (mild): Secondary | ICD-10-CM | POA: Diagnosis not present

## 2024-06-23 DIAGNOSIS — Z885 Allergy status to narcotic agent status: Secondary | ICD-10-CM | POA: Diagnosis not present

## 2024-06-23 DIAGNOSIS — M797 Fibromyalgia: Secondary | ICD-10-CM | POA: Diagnosis not present

## 2024-06-23 DIAGNOSIS — Z79899 Other long term (current) drug therapy: Secondary | ICD-10-CM | POA: Diagnosis not present

## 2024-06-23 DIAGNOSIS — I611 Nontraumatic intracerebral hemorrhage in hemisphere, cortical: Secondary | ICD-10-CM | POA: Diagnosis not present

## 2024-06-23 DIAGNOSIS — Z91040 Latex allergy status: Secondary | ICD-10-CM | POA: Diagnosis not present

## 2024-06-23 DIAGNOSIS — R2689 Other abnormalities of gait and mobility: Secondary | ICD-10-CM | POA: Diagnosis not present

## 2024-06-23 DIAGNOSIS — G912 (Idiopathic) normal pressure hydrocephalus: Secondary | ICD-10-CM | POA: Diagnosis not present

## 2024-06-23 DIAGNOSIS — E440 Moderate protein-calorie malnutrition: Secondary | ICD-10-CM | POA: Diagnosis not present

## 2024-06-23 DIAGNOSIS — I1310 Hypertensive heart and chronic kidney disease without heart failure, with stage 1 through stage 4 chronic kidney disease, or unspecified chronic kidney disease: Secondary | ICD-10-CM | POA: Diagnosis not present

## 2024-06-23 DIAGNOSIS — F339 Major depressive disorder, recurrent, unspecified: Secondary | ICD-10-CM | POA: Diagnosis not present

## 2024-06-23 DIAGNOSIS — Z7982 Long term (current) use of aspirin: Secondary | ICD-10-CM | POA: Diagnosis not present

## 2024-06-23 DIAGNOSIS — Z9181 History of falling: Secondary | ICD-10-CM | POA: Diagnosis not present

## 2024-06-23 DIAGNOSIS — N179 Acute kidney failure, unspecified: Secondary | ICD-10-CM | POA: Diagnosis not present

## 2024-06-23 DIAGNOSIS — M170 Bilateral primary osteoarthritis of knee: Secondary | ICD-10-CM | POA: Diagnosis not present

## 2024-06-23 DIAGNOSIS — F32A Depression, unspecified: Secondary | ICD-10-CM | POA: Diagnosis not present

## 2024-06-23 DIAGNOSIS — G9389 Other specified disorders of brain: Secondary | ICD-10-CM | POA: Diagnosis not present

## 2024-06-23 DIAGNOSIS — N401 Enlarged prostate with lower urinary tract symptoms: Secondary | ICD-10-CM | POA: Diagnosis not present

## 2024-06-23 DIAGNOSIS — I10 Essential (primary) hypertension: Secondary | ICD-10-CM | POA: Diagnosis not present

## 2024-06-23 DIAGNOSIS — Z96643 Presence of artificial hip joint, bilateral: Secondary | ICD-10-CM | POA: Diagnosis not present

## 2024-06-23 DIAGNOSIS — R42 Dizziness and giddiness: Secondary | ICD-10-CM | POA: Diagnosis not present

## 2024-06-23 DIAGNOSIS — I161 Hypertensive emergency: Secondary | ICD-10-CM | POA: Diagnosis not present

## 2024-06-23 DIAGNOSIS — I130 Hypertensive heart and chronic kidney disease with heart failure and stage 1 through stage 4 chronic kidney disease, or unspecified chronic kidney disease: Secondary | ICD-10-CM | POA: Diagnosis not present

## 2024-06-23 DIAGNOSIS — Z6825 Body mass index (BMI) 25.0-25.9, adult: Secondary | ICD-10-CM | POA: Diagnosis not present

## 2024-06-23 DIAGNOSIS — R4189 Other symptoms and signs involving cognitive functions and awareness: Secondary | ICD-10-CM | POA: Diagnosis not present

## 2024-06-23 DIAGNOSIS — Z9049 Acquired absence of other specified parts of digestive tract: Secondary | ICD-10-CM | POA: Diagnosis not present

## 2024-06-23 DIAGNOSIS — I452 Bifascicular block: Secondary | ICD-10-CM | POA: Diagnosis not present

## 2024-06-23 DIAGNOSIS — Z7409 Other reduced mobility: Secondary | ICD-10-CM | POA: Diagnosis not present

## 2024-06-23 DIAGNOSIS — Z23 Encounter for immunization: Secondary | ICD-10-CM | POA: Diagnosis not present

## 2024-06-23 NOTE — Progress Notes (Signed)
 [facility]    Primary Source of History:self  Primary Language of Patient:English  Chief Complaint:  Chief Complaint  Patient presents with  . Establish Care  . Dizziness   HPI: Joe Patton is a 78 y.o.  male  who presents for a new patient evaluation. Accompanied by his wife.  History of Present Illness Joe Patton is a 78 year old male with coronary artery disease who presents with worsening dizziness and weakness.  He has been experiencing worsening dizziness and weakness over the past month. The dizziness is intermittent, occurring throughout the day, and has become more severe. He describes feeling 'shaky' and 'wobbly' when standing, with his legs quivering. No recent falls, but he uses a cane for mobility and last fell about three months ago. No recent head trauma, severe headaches, or vision changes, although he notes minor headaches when feeling dizzy. No bowel or bladder issues.  He underwent a stent placement in March, which was complicated by bleeding and required additional surgery. Since then, he has noticed a decline in his physical abilities, including increased dizziness and weakness, significantly impacting his ability to perform daily activities such as driving and yard work.  His current medications include Plavix , vitamin D, B12, and a multivitamin. He is not taking gabapentin , hydralazine , or sildenafil , and he discontinued aspirin  and Celexa . He is allergic to tramadol  and latex.  In terms of family history, his mother had breast cancer, and his father had high blood pressure, prostate cancer, and a stroke. He quit smoking over 25 years ago after smoking a pack a day for several years. He does not drink alcohol or use illicit drugs.   Previous Providers: Records available: Care Everywhere, [facility]  PMHx/PSHx/SocHx/FamHx/ Allergies and Meds:  Patient Active Problem List  Diagnosis  . Essential hypertension  .  Obesity  . Depression with anxiety  . Osteoarthritis  . Arthrosis of shoulder  . Arthrosis of knee  . Osteoarthritis of both knees, unspecified osteoarthritis type  . Benign prostatic hypertrophy with lower urinary tract symptoms (LUTS)  . Obstructive sleep apnea  . Presence of artificial shoulder joint  . Coronary arteriosclerosis  . Pseudoaneurysm following procedure ()  . Cerebrovascular small vessel disease  . Degeneration of lumbar intervertebral disc  . Dizziness  . Fibromyalgia  . History of hypotension  . Hypokalemia  . Impaired mobility  . Left heart failure (CMS/HHS-HCC)  . Long term current use of aspirin   . Mixed hyperlipidemia  . Multiple actinic keratoses  . Myocardial injury  . Not for resuscitation  . Polyneuropathy  . Peripheral venous insufficiency  . Recurrent falls  . Recurrent major depressive disorder, in remission ()  . Seborrheic keratosis  . Stage 2 chronic kidney disease  . Stasis dermatitis  . Disorder of connective tissue (CMS/HHS-HCC)   Past Medical History:  Diagnosis Date  . Abnormal EKG   . Acute kidney injury superimposed on chronic kidney disease () 04/16/2024  . Allergic state   . Arthrosis of knee   . Arthrosis of shoulder   . Benign prostatic hyperplasia with lower urinary tract symptoms   . BPH (benign prostatic hypertrophy)   . Cataract cortical, senile   . Coronary artery disease involving native coronary artery of native heart with angina pectoris () 11/19/2023  . Depression   . DJD (degenerative joint disease)   . Fall at home, initial encounter 04/16/2024  . Heart murmur   . Hypertension   .  Obesity   . Obstructive sleep apnea   . Osteoarthritis of both knees, unspecified osteoarthritis type   . Pseudoaneurysm following procedure   . Psoriasis   . Smoking   . Varicella     Past Surgical History:  Procedure Laterality Date  . Left total hip arthroplasty using [employer] prosthesis, femoral stem size 61M,  acetabular cup size 54 mm with a 0 degree liner, head size 28 mm plus 0.   10/29/1995   Dr Debby SHAUNNA Benne , [facility]  . Right total hip arthroplasty   01/08/2013   Dr Mardee  . ER-Right hip reduction under conscious sedation  10/01/2023   Dr Zafonte  . LEFT HEART CATH AND CORONARY ANGIOGRAPHY  11/14/2023  . REPAIR FEMORAL ARTERY ANEURYSM/PSEUDOANEURYSM Right 12/27/2023   Procedure: DIRECT REPAIR OF ANEURYSM, PSEUDOANEURYSM, OR EXCISION (PARTIAL/TOTAL) AND GRAFT INSERT, WITH/WITHOUT PATCH GRAFT; FOR ANEURYSM, PSEUDOANEURYSM, AND ASSO OCCLUSIVE DISEASE, COMMON FEMORAL ARTERY;  Surgeon: Darra Dozier Blunt, MD;  Location: [facility];  Service: General Surgery;  Laterality: Right;  . CHOLECYSTECTOMY      Social History   Socioeconomic History  . Marital status: Married  . Number of children: 1  . Highest education level: Some college, no degree  Occupational History  . Occupation: Retired  Tobacco Use  . Smoking status: Former    Current packs/day: 0.00    Average packs/day: 1 pack/day for 32.0 years (32.0 ttl pk-yrs)    Types: Cigarettes    Start date: 24    Quit date: 2000    Years since quitting: 25.7    Passive exposure: Past  . Smokeless tobacco: Never  Vaping Use  . Vaping status: Never Used  Substance and Sexual Activity  . Alcohol use: No    Alcohol/week: 0.0 standard drinks of alcohol  . Drug use: Never  . Sexual activity: Not Currently  Social History Narrative   06/23/2024 Lives at home with wife. Denies any guns at home.    Social Drivers of Corporate investment banker Strain: Low Risk  (06/23/2024)   Overall Financial Resource Strain (CARDIA)   . Difficulty of Paying Living Expenses: Not very hard  Food Insecurity: No Food Insecurity (06/23/2024)   Hunger Vital Sign   . Worried About Programme researcher, broadcasting/film/video in the Last Year: Never true   . Ran Out of Food in the Last Year: Never true  Transportation Needs: No Transportation Needs (06/23/2024)   PRAPARE -  Transportation   . Lack of Transportation (Medical): No   . Lack of Transportation (Non-Medical): No  Physical Activity: Insufficiently Active (02/15/2022)   Received from [facility]   Exercise Vital Sign   . On average, how many days per week do you engage in moderate to strenuous exercise (like a brisk walk)?: 7 days   . On average, how many minutes do you engage in exercise at this level?: 20 min  Stress: No Stress Concern Present (02/15/2022)   Received from [facility] of Occupational Health - Occupational Stress Questionnaire   . Feeling of Stress : Not at all  Social Connections: Socially Integrated (04/17/2024)   Received from [facility]   Social Connection and Isolation Panel   . In a typical week, how many times do you talk on the phone with family, friends, or neighbors?: More than three times a week   . How often do you get together with friends or relatives?: More than three times a week   .  How often do you attend church or religious services?: More than 4 times per year   . Do you belong to any clubs or organizations such as church groups, unions, fraternal or athletic groups, or school groups?: Yes   . How often do you attend meetings of the clubs or organizations you belong to?: More than 4 times per year   . Are you married, widowed, divorced, separated, never married, or living with a partner?: Married  Housing Stability: Low Risk  (06/23/2024)   Housing Stability Vital Sign   . Unable to Pay for Housing in the Last Year: No   . Number of Times Moved in the Last Year: 0   . Homeless in the Last Year: No   Family History  Problem Relation Name Age of Onset  . Breast cancer Mother    . High blood pressure (Hypertension) Father    . Prostate cancer Father    . Stroke Father    . No Known Problems Sister    . Tobacco abuse Brother    . No Known Problems Son      Tramadol  and Latex Outpatient Medications Marked as Taking for the 06/23/24 encounter  (Office Visit) with Lacambacal, Daniel Vincent Serame, NP  Medication Sig Dispense Refill  . cholecalciferol (VITAMIN D3) 1000 unit tablet Take 1,000 Units by mouth once daily    . clopidogreL  (PLAVIX ) 75 mg tablet Take 1 tablet (75 mg total) by mouth once daily 90 tablet 1  . cyanocobalamin  (VITAMIN B12) 100 MCG tablet Take 100 mcg by mouth every morning before breakfast (0630)    . docusate (COLACE) 100 MG capsule Take 100 mg by mouth 2 (two) times daily    . finasteride  (PROSCAR ) 5 mg tablet Take 5 mg by mouth once daily    . gabapentin  (NEURONTIN ) 100 MG capsule Take 100 mg by mouth at bedtime    . hydrALAZINE  (APRESOLINE ) 50 MG tablet Take 50 mg by mouth 3 (three) times daily as needed (systolic BP >160)    . labetaloL  (TRANDATE ) 100 MG tablet Take 1 tablet (100 mg total) by mouth 2 (two) times daily 60 tablet 5  . losartan  (COZAAR ) 100 MG tablet Take 1 tablet (100 mg total) by mouth once daily 60 tablet 0  . meclizine  (ANTIVERT ) 25 mg tablet Take 25 mg by mouth 3 (three) times daily as needed for Dizziness    . multivitamin tablet Take 1 tablet by mouth once daily.    . oxyCODONE  (ROXICODONE ) 5 MG immediate release tablet Take 1 tablet (5 mg total) by mouth every 4 (four) hours as needed for Pain for up to 15 doses 15 tablet 0  . polyethylene glycol (MIRALAX ) packet Take 17 g by mouth once daily    . solifenacin  (VESICARE ) 10 MG tablet Take 10 mg by mouth once daily    . vitamin E 1000 UNIT capsule Take 1,000 Units by mouth once daily.       Depression Screening - PHQ2/9     01/22/2024   10:03 AM 06/23/2024    2:24 PM  PHQ-2/9 Depression Screening   Little interest or pleasure in doing things 0 2  Feeling down, depressed, or hopeless 0 3  Patient Health Questionnaire-2 Score 0 * 5 *  Trouble falling or staying asleep, or sleeping too much  0  Feeling tired or having little energy  3  Poor appetite or overeating  0  Feeling bad about yourself - or that you are a failure  or have let  yourself or your family down  3  Trouble concentrating on things, such as reading the newspaper or watching television  0  Moving or speaking so slowly that other people could have noticed? Or the opposite - being so fidgety or restless that you have been moving around a lot more than usual.  3  Thoughts that you would be better off dead or hurting yourself in some way  0  Patient Health Questionnaire-9 Score  14 *    * Patient-reported  * Data saved with a previous flowsheet row definition    PHQ-2 Interpretation Values between 0-3 are considered not significant for depression  PHQ-9 Interpretation and Treatment Recommendations:  0-4= None  5-9= Mild / Treatment: Support, educate to call if worse; return in one month  10-14= Moderate / Treatment: Support, watchful waiting; Antidepressant or Psychotherapy  15-19= Moderately severe / Treatment: Antidepressant OR Psychotherapy  >= 20 = Major depression, severe / Antidepressant AND Psychotherapy  Anxiety Screening - GAD2/7    01/22/2024   10:03 AM 06/23/2024    2:20 PM  GAD-7 w/ Score  Feeling nervous, anxious, or on edge  More than half the days  Not being able to stop or control worrying  More than half the days  Worrying too much about different things  More than half the days  Trouble relaxing  More than half the days  Being so restless that it is hard to sit still  Not at all  Becoming easily annoyed or irritable  Not at all  Feeling afraid as if something awful might happen  More than half the days  GAD-7 Total Score  10 *  --    (OBSOLETE) Over the past 2 weeks, have you felt nervous, anxious, or on edge? Not at all *   (OBSOLETE) Over the past 2 weeks, have you not been able to stop or control worrying? Not at all *   (OBSOLETE) GAD-7 Total Score 0 *   --      * Patient-reported  * Data saved with a previous flowsheet row definition    Anxiety Severity GAD-7 total score for the seven items ranges from 0 to 21.  0-4:  minimal anxiety  5-9: mild anxiety  10-14: moderate anxiety  15-21: severe anxiety    SDOH Screening Social Drivers of Health with Concerns   Tobacco Use: Medium Risk (06/23/2024)   Patient History   . Smoking Tobacco Use: Former   . Smokeless Tobacco Use: Never   . Passive Exposure: Past  Physical Activity: Insufficiently Active (02/15/2022)   Received from [facility]   Exercise Vital Sign   . On average, how many days per week do you engage in moderate to strenuous exercise (like a brisk walk)?: 7 days   . On average, how many minutes do you engage in exercise at this level?: 20 min  Depression: Moderate depression (06/23/2024)   PHQ-9   . PHQ-9 Score: 14    Health Maintenance: Health Maintenance  Topic Date Due  . Hepatitis C Screen  Never done  . Medicare Initial AWV [id number]  Never done  . Colorectal Cancer Screening  Never done  . Annual Urine Albumin Creatinine Ratio  Never done  . Adult Tetanus (Td And Tdap)  05/05/1999  . AAA Screen  Never done  . Shingrix (2 of 2) 04/28/2015  . RSV Immunization Pregnant or 60+ (1 - 1-dose 75+ series) Never done  . Influenza Vaccine (1) 06/02/2024  .  Creatinine Level  12/26/2024  . Potassium Level  12/26/2024  . Serum Calcium   12/26/2024  . Lipid Panel  06/04/2025  . Depression Screening  06/23/2025  . Diabetes Screening  12/27/2026  . Pneumococcal Vaccine: 50+  Completed  . Hib Vaccines  Aged Out  . Hepatitis A Vaccines  Aged Out  . Meningococcal B Vaccine  Aged Out  . Meningococcal ACWY Vaccine  Aged Out  . HPV Vaccines  Aged Out     ROS:  Review of Systems  Constitutional:  Negative for chills, diaphoresis, fever, malaise/fatigue and weight loss.  HENT:  Negative for hearing loss.   Eyes:  Negative for blurred vision and double vision.  Respiratory:  Negative for cough and shortness of breath.   Cardiovascular:  Negative for chest pain, palpitations and leg swelling.  Gastrointestinal:  Negative for abdominal pain, blood  in stool, constipation, diarrhea, nausea and vomiting.  Genitourinary:  Negative for dysuria and hematuria.  Musculoskeletal:  Positive for falls (3 months ago). Negative for back pain, joint pain, myalgias and neck pain.  Skin:  Negative for itching and rash.  Neurological:  Positive for dizziness and weakness. Negative for tingling, tremors, sensory change, speech change, focal weakness, seizures, loss of consciousness and headaches.  Endo/Heme/Allergies:  Bruises/bleeds easily.  Psychiatric/Behavioral:  Negative for depression and suicidal ideas. The patient is not nervous/anxious.     Objective:   Vitals:   06/23/24 1510 06/23/24 1519 06/23/24 1525 06/23/24 1529  BP: (!) 197/91 (!) 192/95 (!) 197/100 (supine) (!) 187/97 (standing)  Pulse: 73 59 52 67  Resp: 12     Temp: 36.6 C (97.9 F)     TempSrc: Oral     Weight: 94.3 kg (208 lb)     Height: 188 cm (6' 2)     PainSc:   4     PainLoc: Back      Body mass index is 26.71 kg/m.  Physical Exam: Physical Exam Vitals reviewed.  Constitutional:      General: He is not in acute distress.    Appearance: Normal appearance. He is not ill-appearing, toxic-appearing or diaphoretic.  HENT:     Head: Normocephalic and atraumatic.     Mouth/Throat:     Mouth: Mucous membranes are moist.  Eyes:     General: No scleral icterus.    Conjunctiva/sclera: Conjunctivae normal.  Cardiovascular:     Rate and Rhythm: Normal rate and regular rhythm.     Pulses: Normal pulses.     Heart sounds: Normal heart sounds.  Pulmonary:     Effort: Pulmonary effort is normal. No respiratory distress.     Breath sounds: Normal breath sounds.  Abdominal:     General: Bowel sounds are normal. There is no distension.     Palpations: Abdomen is soft.     Tenderness: There is no abdominal tenderness.  Musculoskeletal:     Cervical back: Normal range of motion and neck supple. No rigidity or tenderness.     Right lower leg: No edema.     Left lower  leg: No edema.  Lymphadenopathy:     Cervical: No cervical adenopathy.  Skin:    General: Skin is warm and dry.     Capillary Refill: Capillary refill takes less than 2 seconds.     Coloration: Skin is not pale.     Findings: Bruising (on bilat arms) present.  Neurological:     General: No focal deficit present.     Mental Status: He  is alert and oriented to person, place, and time.     Motor: Weakness (generalized) present.     Coordination: Coordination is intact.     Gait: Gait abnormal (shuffling, weakness with taking steps).  Psychiatric:        Mood and Affect: Mood normal.        Behavior: Behavior normal.        Thought Content: Thought content normal.        Judgment: Judgment normal.     Pertinent Imaging:  CLINICAL DATA:  Syncope/presyncope, cerebrovascular cause suspected   EXAM:  MRI HEAD WITHOUT CONTRAST   TECHNIQUE:  Multiplanar, multiecho pulse sequences of the brain and surrounding  structures were obtained without intravenous contrast.   COMPARISON:  CT head from April 16, 2024.   FINDINGS:  Brain: No acute infarction, hemorrhage, hydrocephalus, extra-axial  collection or mass lesion. Cerebral atrophy. Moderate patchy  T2/FLAIR hyperintensities the white matter, compatible with chronic  microvascular ischemic disease. Multiple small remote lacunar  infarcts in the corona radiata and basal ganglia bilaterally.   Vascular: Normal flow voids.   Skull and upper cervical spine: Normal marrow signal.   Sinuses/Orbits: Negative.   Other: No mastoid effusions.   IMPRESSION:  1. No evidence of acute intracranial abnormality.  2. Moderate chronic microvascular ischemic disease.  3.  Cerebral Atrophy (ICD10-G31.9).    Electronically Signed    By: Gilmore GORMAN Molt M.D.    On: 04/17/2024 02:45    Recent labs - Lab Results  Component Value Date   WBC 9.2 12/28/2023   HGB 9.4 (L) 12/28/2023   HCT 28.4 (L) 12/28/2023   PLT 156 12/28/2023   - Lab  Results  Component Value Date   NA 142 12/27/2023   K 3.7 12/27/2023   CL 111 (H) 12/27/2023   CO2 23 12/27/2023   BUN 23 (H) 12/27/2023   CREATININE 1.2 12/27/2023   CALCIUM  8.4 (L) 12/27/2023   ALB 4.1 06/04/2024   TBILI 0.7 06/04/2024   ALKPHOS 79 06/04/2024   AST 15 06/04/2024   ALT 11 06/04/2024   GLUCOSE 110 12/27/2023   GFR 63 12/27/2023   - Lab Results  Component Value Date   CHOLTOTAL 169 06/04/2024   TRIG 63 06/04/2024   HDL 47.7 06/04/2024   LDLCALC 109 06/04/2024   - Lab Results  Component Value Date   HGBA1C 4.8 11/19/2023   -No results found for: TSH, T4FREE  Assessment/ Plan :   [facility] Bryan Goin presents for a new patient evaluation.   Assessment & Plan Acute generalized weakness and dizziness   Hypertension Elevated blood pressure in 190s/90s range, contributing to dizziness and weakness. Risk for stroke or cardiovascular events.  Percutaneous coronary intervention and stent placement Post-PCI and stent placement complications with worsening symptoms. Concern for stent-related issues.  Recording duration: 51 minutes  Assessment & Plan Generalized weakness Dizziness At high risk for falls Gait instability Worsening generalized weakness and dizziness x1 month. Previously reported that he was walking and fairly independent per wife. Also reports increased confusion x1 month. Physical exam showed diffuse generalized weakness without any focal neuro deficits, NEGATIVE orthostatics. Previous MRI 04/2024 didn't show any concerning findings. Differential includes electrolyte imbalances, intracerebral bleed given plavix  use, neuro disorder, vs other etiology. - Advise immediate ED evaluation for CT head without contrast to rule out subdural or other ICH. - Order labs for electrolyte imbalances (I.e. r/o hypokalemia/natremia/magnesemia, etc) - Recommend neurology evaluation for generalized weakness as part of ER  evaluation.  - Discuss home health  evaluation for safety which they are amenable to.  Jozef P Nunzio Banet understands they have an acute condition that potentially poses a threat to life or bodily function.   Orders: .  Orthostatic blood pressure .  Ambulatory Referral to Home Health  Essential hypertension Assessment: Blood pressure under poor control. Plan: -  Continue current medication(s) -  Encouraged dietary sodium restriction/DASH diet -  Recommended regular aerobic exercise. -  Recommend home blood pressure monitoring, to bring results in next visit -  Goal of BP <140/90 -  Need to f/u with Cardiology for reevaluation of HTN control.     Depression with anxiety Chronic. Some worsening secondary to recent weakness. F/u with PCP as scheduled.  Orders: SABRA  Medicare Depression Screen .  Anxiety Screen [[id number]]     Orders Placed Today:  Orders Placed This Encounter  Procedures  . Complete Blood Count (CBC) with Differential  . Comprehensive Metabolic Panel (CMP)  . Thyroid Stimulating Hormone (TSH) with reflex to Free Thyroxine (FT4)  . Medicare Depression Screen  . Anxiety Screen [[id number]]  . Orthostatic blood pressure  The treatment plan was discussed with the patient and they are amenable with the orders/referrals/labs placed for today's visit.   DANIEL VINCENT SERAME LACAMBACAL, NP   Portions of this record may have been dictated using voice recognition software or created using automated tools. Unintentional errors in spelling and vocabulary are possible and may sometimes remain uncorrected.  It has been reviewed by TORIBIO JERRELL HUA LACAMBACAL   This visit was coded based on medical decision making (MDM).  Future Appointments     Date/Time Provider Department Center Visit Type   09/10/2024 10:00 AM (Arrive by 9:45 AM) Jyl Heron Haff, MD [facility] [place] [facility] NEW PATIENT   12/04/2024 11:00 AM Callwood, Cara Endow, MD [facility] FOLLOW UP        *Some images could not be shown.

## 2024-06-29 NOTE — Discharge Summary (Addendum)
 [facility] Medicine Discharge Summary  Admit Date: 06/23/2024 Discharge Date: 06/30/2024 Admitting Physician: Libby Michelina Gore, MD Discharge Physician: Waldemar Bridgeman, MD  Primary Care Provider: Joshua Cathryne Rodney, MD, Phone [phone]  Discharge Destination: Home with home health:   Admission Diagnoses:  Cerebral ventriculomegaly [G93.89] Hypertensive urgency [I16.0]  Discharge Diagnoses:  Principal Problem:   Hypertensive emergency Active Problems:   Essential hypertension   Coronary arteriosclerosis   Dizziness   Stage 2 chronic kidney disease   Acute kidney injury superimposed on stage 2 chronic kidney disease () Resolved Problems:   * No resolved hospital problems. *  Primary Diagnosis: Admitted for Hypertensive Emergency  Changes Made (with rationale):  Vitamin B12 1000 mcg daily Antihypertensives as per [facility] summary  To-Do List (incidental findings, follow-up studies, etc.): Restart rosuvastatin  after discussion with the patient, not taking at home  Anticipatory Guidance for Outpatient Care:  CBC, RFT in a week    Results Pending at Discharge:  None Please see phone numbers at end of this summary for lab contact information.   Follow-up/Care Transition Plan: Sched. appts: Future Appointments  Date Time Provider Department Center  07/07/2024  2:30 PM Salli Doretta Large, MD [facility] CLI  07/23/2024 11:15 AM Teressa Othel Moats, NP [facility]  09/10/2024 10:00 AM Jyl Heron Haff, MD [facility]  12/04/2024 11:00 AM Callwood, Cara Endow, MD [facility] MARYL BROCKS    Follow-up info: [facility] [address] [place]  [phone]    [facility] [address] [place]  [address] [phone]    Joshua Cathryne Rodney, MD [address] [facility] [place] [address] [phone]  Follow up in 1 week(s)        Allergies/Intolerances:  Allergies  Allergen Reactions  . Tramadol  Unknown and Other (See Comments)    Anger issues  tramadol   . Latex Rash     New Adverse Drug Events: none  Medications:     Current Discharge Medication List     PAUSE taking these medications      Instructions  rosuvastatin  20 MG tablet Wait to take this until your doctor or other care provider tells you to start again. Touch base with the PCP regarding restart of rosuvastatin . Quantity: 90 tablet Refills: 1  Commonly known as: CRESTOR  Take 1 tablet (20 mg total) by mouth once daily       START taking these medications      Instructions  docusate 100 MG capsule Quantity: 20 capsule Refills: 0 Stop taking on: July 10, 2024  Commonly known as: COLACE Take 1 capsule (100 mg total) by mouth 2 (two) times daily for 10 days Last time this was given: 100 mg on June 30, 2024  8:02 AM   hydrALAZINE  100 MG tablet Quantity: 90 tablet Refills: 0  Commonly known as: APRESOLINE  Take 1 tablet (100 mg total) by mouth every 8 (eight) hours Last time this was given: 100 mg on June 30, 2024  8:01 AM       These medications have been CHANGED      Instructions  amLODIPine  10 MG tablet Quantity: 30 tablet Refills: 0 Start taking on: July 01, 2024 What changed:  medication strength how much to take  Commonly known as: NORVASC  Take 1 tablet (10 mg total) by mouth once daily Last time this was given: 10 mg on June 30, 2024  8:02 AM       CONTINUE taking  these medications      Instructions  ascorbic acid (vitamin C) 500 MG tablet Refills: 0  Commonly known as: VITAMIN C Take 500 mg by mouth once daily   aspirin  81 MG EC tablet Refills: 0  Take 81 mg by mouth once daily Last time this was given: 81 mg on June 30, 2024  8:02 AM   cholecalciferol 1000 unit tablet Refills: 0  Commonly known as: VITAMIN D3 Take 1,000 Units by mouth once daily Last time this was  given: 1,000 Units on June 30, 2024  8:02 AM   citalopram  20 MG tablet Refills: 0  Commonly known as: CeleXA  Take 20 mg by mouth once daily Last time this was given: 20 mg on June 30, 2024  8:02 AM   clopidogreL  75 mg tablet Quantity: 90 tablet Refills: 1  Commonly known as: PLAVIX  Take 1 tablet (75 mg total) by mouth once daily Last time this was given: 75 mg on June 30, 2024  8:02 AM   cyanocobalamin  100 MCG tablet Refills: 0  Commonly known as: VITAMIN B12 Take 100 mcg by mouth every morning before breakfast (0630) Last time this was given: 1,000 mcg on June 30, 2024  8:02 AM   finasteride  5 mg tablet Refills: 0  Commonly known as: PROSCAR  Take 5 mg by mouth once daily Last time this was given: 5 mg on June 30, 2024  8:02 AM   gabapentin  100 MG capsule Refills: 0  Commonly known as: NEURONTIN  Take 100 mg by mouth at bedtime as needed Last time this was given: 100 mg on June 29, 2024  8:02 PM   losartan  100 MG tablet Quantity: 60 tablet Refills: 0  Commonly known as: COZAAR  Take 1 tablet (100 mg total) by mouth once daily Last time this was given: 100 mg on June 30, 2024  8:02 AM   meclizine  25 mg tablet Refills: 0  Commonly known as: ANTIVERT  Take 25 mg by mouth 3 (three) times daily as needed for Dizziness   oxyCODONE  5 MG immediate release tablet Quantity: 15 tablet Refills: 0  Commonly known as: ROXICODONE  Take 1 tablet (5 mg total) by mouth every 4 (four) hours as needed for Pain for up to 15 doses Last time this was given: 5 mg on June 29, 2024  8:02 PM   polyethylene glycol packet Refills: 0  Commonly known as: MIRALAX  Take 17 g by mouth once daily Last time this was given: 17 g on June 30, 2024  8:03 AM       STOP taking these medications    labetaloL  100 MG tablet Commonly known as: TRANDATE          Brief History of Present Illness: (Per Admission H&P) Joe Patton is a 78 y.o. male  with pmh most relevant for coronary artery disease, who is presenting to the ED today with report of worsening weakness and difficulty ambulating.  Patient reports approximately 2 to 3 months ago he was fine and able to do all of his ADLs including driving and shopping and could walk around the grocery store without issue.  Reports over the last 2 months he has slowly gotten progressively weaker and now reports that he can even go across the room without having issues.  He now has to use a cane or a rollator to ambulate.  He is also reporting dizziness worse with standing for which he saw his PCP for, and was found to be orthostatic negative at time.  He reports that his dizziness is worse when he first stands up and he does not have any when he turns his head left or right.  He reports weakness in his legs that he now feels shaky or wobbly any chest does not feel that he can coordinate himself.  On further questioning he does admit that he does not feel as sharp as he did a couple months ago and that he has also been having some leakage for which his urologist started him on a medication which escapes him at time.  Patient was seen in the physician office this day and given his symptoms he was struck commended that he come to the ED.  In the ED patient was found to have significantly elevated blood pressure with peak reading of 217/119 and CT scan done at time was concerning for possible normal pressure hydrocephalus.  Joe Patton was started on a nicardipine drip and admitted to medicine.  _____________________   Hospital Course by Problem: Subjective: Patient seen and examined prior to discharge.  He is resting comfortably, denies any active complaint, eager to go home.  Discussed with nephrology, okay to discharge on current antihypertensive regimen.  #Hypertensive Emergency solved: #HTN #Acute on CKD Stage 2-improving Noted to have a BP as high as 217/119 while in the ER.  Cr 1.6 on admission, up from  1.23 on 04/19/2024.  Started on nicardipine infusion in the ER and home meds were resumed (Amlodipine  5 mg daily and losartan  100 mg daily). Labetolol held on admission due to HR in 40s.  Nicardipine infusion discontinued on 9/22. Started chlorthalidone 25 mg daily on 9/26. Discontinued due to large shifts in blood pressure.   -Nephrology consulted and assisted with medication management.  -Creatinine on discharge 1.4 -Discharge regimen hydralazine  100 mg 3 times daily, amlodipine  10 mg daily, losartan  100 mg daily  #Progressive Weakness #Unsteady Gait #Ventriculomegaly noted on exam, could represent NPH Joe Patton had an MRI Brain performed at [facility] in 04/2024 that did not show ventriculomegaly. Will treat current medical issues and monitor neurologic exam closely. -Case discussed with Neurology. Recommended outpatient follow up at the [facility] (appointment scheduled). No focal neurologic deficits noted during the admission -PT/OT consults requested for rehab recommendations Recommended for HHPT/OT, rolling walker.   #CAD #HLD -Continue ASA, Plavix  -Crestor  is non-formulary (Son reports that this medication is currently being held)   #OSA on CPAP Joe Patton has a home machine that he has not tried yet.  -Encouraged use of CPAP at night at home.   #Report of Mental Fogginess TSH, B12, MMA checked in 04/2024.  -Geriatrics consulted given general decline and memory concerns. Symptoms of fogginess of thinking suspected related to depression.    #Depression Scored 10/15 on the Geriatric Depression Scale per Geriatrics assessment.  Geriatrics recommends continuing Celexa  and offered resources for therapy ([facility]). -Monitor mood   #Pain control On discharge in July 2025 oxycodone  was prescribed for shoulder pain. PDMP reviewed, oxycodone  90 tabs dispensed in 04/23/2024. -Tylenol  prn pain. Oxycodone  prn pain (per prior provider)   #Moderate Protein Calorie  Malnutrition Malnutrition related to social/behavioral/environmental factors as evidenced by weight loss of 11.5% x 6 months and moderate muscle loss.  History of 10 pound weight loss in 6 weeks. I agree with the documentation in the Dietician's note  -Vitamin B12 1000 mcg daily -Encourage oral nutrition  Malnutrition: He has been diagnosed with moderate protein-calorie malnutrition in the context of social/behavioral/environmental circumstances, based on 10% unintentional  weight loss in 6 months, mild muscle mass loss.  - Recommend oral nutrition, Recommend oral nutrition supplements, Recommend trend weight status.    Surgeries and Procedures Performed:    _____________________  Discharge Exam:  BP (!) 151/80   Pulse 75   Temp 36.5 C (97.7 F) (Oral)   Resp 20   Ht 188 cm (6' 2)   Wt 90.5 kg (199 lb 8.3 oz)   SpO2 96%   BMI 25.62 kg/m     GEN Awake, alert, interactive, no distress  HEENT No scleral icterus.  Oropharynx moist.    CHEST Clear to auscultation bilaterally.  CARD Irregularly irregular rhythm.  Normal S1 and S2.    ABD Soft, non-tender, non-distended. Normal bowel sounds.   No guarding or rebound.   MSK No appreciable joint deformity.  Normal muscle bulk and tone.  EXT Warm, well-perfused.  Pulses present, no edema.    Pertinent Lab Testing: Recent Labs  Lab 06/28/24 0549 06/29/24 0419 06/30/24 0543  NA 139 140 139  K 3.7 3.8 3.7  CL 107 106 106  CO2 23 26 25   BUN 28* 31* 31*  CREATININE 1.4* 1.5* 1.4*  GLUCOSE 99 101 97  CALCIUM  9.1 9.3 9.3   Recent Labs  Lab 06/23/24 2010  AST 30  ALT 17  ALKPHOS 57  TBILI 1.2    Recent Labs  Lab 06/23/24 2010  WBC 7.3  HGB 14.2  HCT 42.1  PLT 195   No results for input(s): APTT, INR in the last 168 hours.   Other Pertinent Labs:    Micro:  None    Pertinent Imaging:   CT brain without contrast Result Date: 06/23/2024 EXAM: Unenhanced Head CT INDICATION: Progressive weakness and gait  instability. TECHNIQUE: Serial axial images of the head were obtained.  Dose reduction was obtained with Automatic Exposure Control (AEC) or, if AEC could not be utilized, by manual adjustment of the mA and/or kV according to patient size. CONTRAST DOSE: None. COMPARISON: None. FINDINGS: Calvarium and Scalp: No abnormalities noted. Ventricles, Cisterns, and Sulci: Parenchymal volume loss and ventriculomegaly. Abnormal extra-axial fluid collections: None noted. Brain Parenchyma: Confluent periventricular hypoattenuation without acute intracranial hemorrhage or mass effect. Intracranial vascular calcifications. Sinuses and orbits: Prior left orbital surgery. No acute abnormalities          NOTE: Acute infarction may not be detected by CT scan for 24-48 hours.  If clinically indicated, follow up brain CT or MR is suggested. IMPRESSION: Ventriculomegaly out of proportion to the degree of cerebral atrophy, a finding which can be seen with normal pressure hydrocephalus. Periventricular hypoattenuation likely due to chronic small vessel ischemic disease. Electronically Signed by:  Sybil Fairly, MD, [facility] Electronically Signed on:  06/23/2024 10:14 PM  _____________________  Code Status: Full Code Goals of care were addressed during this admission:  .   Status on Discharge:  Current activity: Walks occasionally (06/30/24 9191) Current mobility: Slightly limited (06/30/24 9191)  Activity Recommendation: activity as tolerated  Other Discharge Instructions: Services setup at discharge: Home Health PT/OT Tubes/lines at discharge: None  Diet: Diet low fat low cholesterol 2 GM NA Oral Supplements - Adult All Supplements; Ensure Plus High Protein-Chocolate; Breakfast  Wound Care Order Instructions     None       _____________________  Time spent on discharge process: 45 minutes    WALDEMAR BRIDGEMAN, MD [facility]  06/30/2024   Hospital Contact Information:  Madie Persons [facility]) [facility]) [employer] (  [facility])  Pending tests:  Laboratory: [phone] Microbiology: [phone] Pathology: [phone] Radiology: [phone]  General questions: [phone] Pending tests: Laboratory: [phone] Microbiology: [phone] Pathology: [phone] Radiology: [phone]  General questions:  [phone] Pending tests:  Laboratory: [phone] Microbiology: [phone] Pathology: [phone] Radiology: [phone]  General questions:  [phone]

## 2024-06-30 NOTE — Progress Notes (Signed)
 Case Manager Discharge Summary / Closing Note  Expected Discharge Date & Time: 06/30/2024 at   Discharge Plan:  The patient and patient representative(s) have been involved in the development of this plan and are in agreement.    Post-Acute Services Coordinated: Home Medical Care - Admitted Since 06/23/2024     Service Provider Services Address Phone Fax Patient Preferred   [facility] [address] [place] [address] [phone] --      Home Health    Flowsheet Row Most Recent Value  Agency Info / Branch Phone [facility] / [phone]  Services Arranged Physical Therapy, Nursing, Occupational Therapy, Social Work, Nursing Aide  Start of Care 06/29/24  Information Provided This agency has Epic / Medlink access and has agreed to retrieve the order and necessary supporting documentation independently  Teachable Caregiver name & phone number [facility]  Teachable Caregiver name & phone number [facility]  [facility] List Declined  CMS Quality Data List Declined  Pt/Family Preference Preference (see progress notes and resource information)  Financial Disclosure [facility] financial disclosure provided   Durable Medical Equipment - Admitted Since 06/23/2024     Service Provider Services Address Phone Fax Patient Preferred   [facility] [address] [place], [place] [address] [phone] --      DME Documentation    Flowsheet Row Most Recent Value  Agency Name [facility]  Agency Phone [phone]  Agency Fax [phone]  Equipment Arranged rolling walker  Delivery Date & Location delivered to bedside prior to discharge   Transportation: Arrangements: patient arranged Discharge Transportation: private vehicle  Final ADT:  Final ADT Discharge Disposition: Home Based  Home Based: Home Health [facility] RN, PT, OT - NOT Private Duty Nursing)                  Second IMM Received: CCS consult placed to deliver 2nd IMM prior to D/C on Monday 09/29.  Final Summary: Anticipate patient will D/C home care of family with resumption of HH and DME as indicated above, no additional needs noted.  Hargis Eagles, MSW, ACM-SW Case Manager, [facility] [phone]

## 2024-07-01 ENCOUNTER — Telehealth: Payer: Self-pay | Admitting: *Deleted

## 2024-07-01 NOTE — Transitions of Care (Post Inpatient/ED Visit) (Signed)
   07/01/2024  Name: [facility]. MRN: [id number] DOB: 24-May-1946  Today's TOC FU Call Status: Today's TOC FU Call Status:: Unsuccessful Call (1st Attempt) Unsuccessful Call (1st Attempt) Date: 07/01/24  Attempted to reach the patient regarding the most recent Inpatient/ED visit. Per patient he was not in a good place to talk today.  Per patient he does not have a PCP Follow Up Plan: Additional outreach attempts will be made to reach the patient to complete the Transitions of Care (Post Inpatient/ED visit) call.   Cathlean Headland BSN RN [place] [facility] Care Management Coordinator [email or web address] Direct Dial: [phone]  Fax: [phone] Website: [email or web address]

## 2024-07-02 DIAGNOSIS — N189 Chronic kidney disease, unspecified: Secondary | ICD-10-CM | POA: Diagnosis not present

## 2024-07-02 DIAGNOSIS — I5030 Unspecified diastolic (congestive) heart failure: Secondary | ICD-10-CM | POA: Diagnosis not present

## 2024-07-02 DIAGNOSIS — I951 Orthostatic hypotension: Secondary | ICD-10-CM | POA: Diagnosis not present

## 2024-07-02 DIAGNOSIS — G9340 Encephalopathy, unspecified: Secondary | ICD-10-CM | POA: Diagnosis not present

## 2024-07-02 DIAGNOSIS — I2510 Atherosclerotic heart disease of native coronary artery without angina pectoris: Secondary | ICD-10-CM | POA: Diagnosis not present

## 2024-07-02 DIAGNOSIS — I130 Hypertensive heart and chronic kidney disease with heart failure and stage 1 through stage 4 chronic kidney disease, or unspecified chronic kidney disease: Secondary | ICD-10-CM | POA: Diagnosis not present

## 2024-07-07 DIAGNOSIS — G4733 Obstructive sleep apnea (adult) (pediatric): Secondary | ICD-10-CM | POA: Diagnosis not present

## 2024-07-07 DIAGNOSIS — G629 Polyneuropathy, unspecified: Secondary | ICD-10-CM | POA: Diagnosis not present

## 2024-07-07 DIAGNOSIS — M797 Fibromyalgia: Secondary | ICD-10-CM | POA: Diagnosis not present

## 2024-07-07 DIAGNOSIS — E782 Mixed hyperlipidemia: Secondary | ICD-10-CM | POA: Diagnosis not present

## 2024-07-07 DIAGNOSIS — Z7982 Long term (current) use of aspirin: Secondary | ICD-10-CM | POA: Diagnosis not present

## 2024-07-07 DIAGNOSIS — I5030 Unspecified diastolic (congestive) heart failure: Secondary | ICD-10-CM | POA: Diagnosis not present

## 2024-07-07 DIAGNOSIS — F419 Anxiety disorder, unspecified: Secondary | ICD-10-CM | POA: Diagnosis not present

## 2024-07-07 DIAGNOSIS — E569 Vitamin deficiency, unspecified: Secondary | ICD-10-CM | POA: Diagnosis not present

## 2024-07-07 DIAGNOSIS — E440 Moderate protein-calorie malnutrition: Secondary | ICD-10-CM | POA: Diagnosis not present

## 2024-07-07 DIAGNOSIS — I130 Hypertensive heart and chronic kidney disease with heart failure and stage 1 through stage 4 chronic kidney disease, or unspecified chronic kidney disease: Secondary | ICD-10-CM | POA: Diagnosis not present

## 2024-07-07 DIAGNOSIS — G9340 Encephalopathy, unspecified: Secondary | ICD-10-CM | POA: Diagnosis not present

## 2024-07-07 DIAGNOSIS — G894 Chronic pain syndrome: Secondary | ICD-10-CM | POA: Diagnosis not present

## 2024-07-07 DIAGNOSIS — I2510 Atherosclerotic heart disease of native coronary artery without angina pectoris: Secondary | ICD-10-CM | POA: Diagnosis not present

## 2024-07-07 DIAGNOSIS — M51369 Other intervertebral disc degeneration, lumbar region without mention of lumbar back pain or lower extremity pain: Secondary | ICD-10-CM | POA: Diagnosis not present

## 2024-07-07 DIAGNOSIS — I679 Cerebrovascular disease, unspecified: Secondary | ICD-10-CM | POA: Diagnosis not present

## 2024-07-07 DIAGNOSIS — F330 Major depressive disorder, recurrent, mild: Secondary | ICD-10-CM | POA: Diagnosis not present

## 2024-07-07 DIAGNOSIS — I872 Venous insufficiency (chronic) (peripheral): Secondary | ICD-10-CM | POA: Diagnosis not present

## 2024-07-07 DIAGNOSIS — I951 Orthostatic hypotension: Secondary | ICD-10-CM | POA: Diagnosis not present

## 2024-07-07 DIAGNOSIS — N182 Chronic kidney disease, stage 2 (mild): Secondary | ICD-10-CM | POA: Diagnosis not present

## 2024-07-07 DIAGNOSIS — Z7902 Long term (current) use of antithrombotics/antiplatelets: Secondary | ICD-10-CM | POA: Diagnosis not present

## 2024-07-07 DIAGNOSIS — K5900 Constipation, unspecified: Secondary | ICD-10-CM | POA: Diagnosis not present

## 2024-07-07 DIAGNOSIS — M359 Systemic involvement of connective tissue, unspecified: Secondary | ICD-10-CM | POA: Diagnosis not present

## 2024-07-07 DIAGNOSIS — N401 Enlarged prostate with lower urinary tract symptoms: Secondary | ICD-10-CM | POA: Diagnosis not present

## 2024-07-07 DIAGNOSIS — G9389 Other specified disorders of brain: Secondary | ICD-10-CM | POA: Diagnosis not present

## 2024-07-07 DIAGNOSIS — N179 Acute kidney failure, unspecified: Secondary | ICD-10-CM | POA: Diagnosis not present

## 2024-07-09 DIAGNOSIS — I130 Hypertensive heart and chronic kidney disease with heart failure and stage 1 through stage 4 chronic kidney disease, or unspecified chronic kidney disease: Secondary | ICD-10-CM | POA: Diagnosis not present

## 2024-07-09 DIAGNOSIS — N179 Acute kidney failure, unspecified: Secondary | ICD-10-CM | POA: Diagnosis not present

## 2024-07-09 DIAGNOSIS — N182 Chronic kidney disease, stage 2 (mild): Secondary | ICD-10-CM | POA: Diagnosis not present

## 2024-07-09 DIAGNOSIS — I5030 Unspecified diastolic (congestive) heart failure: Secondary | ICD-10-CM | POA: Diagnosis not present

## 2024-07-09 DIAGNOSIS — G9340 Encephalopathy, unspecified: Secondary | ICD-10-CM | POA: Diagnosis not present

## 2024-07-09 DIAGNOSIS — G9389 Other specified disorders of brain: Secondary | ICD-10-CM | POA: Diagnosis not present

## 2024-07-10 DIAGNOSIS — I10 Essential (primary) hypertension: Secondary | ICD-10-CM | POA: Diagnosis not present

## 2024-07-11 DIAGNOSIS — R011 Cardiac murmur, unspecified: Secondary | ICD-10-CM | POA: Diagnosis not present

## 2024-07-11 DIAGNOSIS — N179 Acute kidney failure, unspecified: Secondary | ICD-10-CM | POA: Diagnosis not present

## 2024-07-11 DIAGNOSIS — I2510 Atherosclerotic heart disease of native coronary artery without angina pectoris: Secondary | ICD-10-CM | POA: Diagnosis not present

## 2024-07-11 DIAGNOSIS — G4733 Obstructive sleep apnea (adult) (pediatric): Secondary | ICD-10-CM | POA: Diagnosis not present

## 2024-07-11 DIAGNOSIS — L821 Other seborrheic keratosis: Secondary | ICD-10-CM | POA: Diagnosis not present

## 2024-07-11 DIAGNOSIS — I25119 Atherosclerotic heart disease of native coronary artery with unspecified angina pectoris: Secondary | ICD-10-CM | POA: Diagnosis not present

## 2024-07-11 DIAGNOSIS — E66811 Obesity, class 1: Secondary | ICD-10-CM | POA: Diagnosis not present

## 2024-07-11 DIAGNOSIS — M359 Systemic involvement of connective tissue, unspecified: Secondary | ICD-10-CM | POA: Diagnosis not present

## 2024-07-11 DIAGNOSIS — E782 Mixed hyperlipidemia: Secondary | ICD-10-CM | POA: Diagnosis not present

## 2024-07-11 DIAGNOSIS — Z7689 Persons encountering health services in other specified circumstances: Secondary | ICD-10-CM | POA: Diagnosis not present

## 2024-07-11 DIAGNOSIS — Z7409 Other reduced mobility: Secondary | ICD-10-CM | POA: Diagnosis not present

## 2024-07-11 DIAGNOSIS — N182 Chronic kidney disease, stage 2 (mild): Secondary | ICD-10-CM | POA: Diagnosis not present

## 2024-07-11 DIAGNOSIS — I10 Essential (primary) hypertension: Secondary | ICD-10-CM | POA: Diagnosis not present

## 2024-07-14 ENCOUNTER — Other Ambulatory Visit: Payer: Self-pay | Admitting: Internal Medicine

## 2024-07-14 DIAGNOSIS — L821 Other seborrheic keratosis: Secondary | ICD-10-CM

## 2024-07-14 DIAGNOSIS — N179 Acute kidney failure, unspecified: Secondary | ICD-10-CM

## 2024-07-16 ENCOUNTER — Other Ambulatory Visit: Payer: Self-pay | Admitting: Internal Medicine

## 2024-07-16 DIAGNOSIS — G4733 Obstructive sleep apnea (adult) (pediatric): Secondary | ICD-10-CM | POA: Diagnosis not present

## 2024-07-16 DIAGNOSIS — I10 Essential (primary) hypertension: Secondary | ICD-10-CM | POA: Diagnosis not present

## 2024-07-16 DIAGNOSIS — R413 Other amnesia: Secondary | ICD-10-CM | POA: Diagnosis not present

## 2024-07-16 DIAGNOSIS — L821 Other seborrheic keratosis: Secondary | ICD-10-CM

## 2024-07-16 DIAGNOSIS — I2510 Atherosclerotic heart disease of native coronary artery without angina pectoris: Secondary | ICD-10-CM | POA: Diagnosis not present

## 2024-07-16 DIAGNOSIS — Z955 Presence of coronary angioplasty implant and graft: Secondary | ICD-10-CM | POA: Diagnosis not present

## 2024-07-16 DIAGNOSIS — R296 Repeated falls: Secondary | ICD-10-CM | POA: Diagnosis not present

## 2024-07-16 DIAGNOSIS — N182 Chronic kidney disease, stage 2 (mild): Secondary | ICD-10-CM

## 2024-07-16 NOTE — Progress Notes (Signed)
 Joe Patton is a 78 y.o. male that comes today for the following problem(s):   Chief Complaint  Patient presents with  . Establish Care    HPI:  History of Present Illness Joe Patton is a 78 year old male with hypertension, coronary artery disease, and sleep apnea who presents for management of uncontrolled blood pressure and memory issues.  He has a history of hypertension and coronary artery disease, with four stents placed in March 2025. He has been hospitalized twice for uncontrolled blood pressure since the stent placement, most recently from September 22 to June 30, 2024, at [facility], with blood pressure readings in the 200s. Prior to this visit, his antihypertensive regimen included amlodipine , spironolactone, losartan , hydrochlorothiazide, and hydralazine  three times a day. Recently, his blood pressure has been low, with readings in the 90s/80s, causing dizziness and falls.  He has coronary artery disease with calcified arteries and veins, and underwent a procedure on December 25, 2023, at [facility] for stent placement. He is on Plavix , and his cardiologist recently recommended discontinuing aspirin  due to bruising. A statin was previously stopped due to confusion and memory issues.  He has been experiencing worsening memory issues and depression over the past year, with difficulty performing tasks like using the TV remote and cell phone. He was started on an increased dose of citalopram . He also reports poor sleep due to discomfort from his neck to his arm, for which he was started on gabapentin .  He has a history of sleep apnea and uses a CPAP machine, which is currently not in use due to setup issues. He was promised assistance with setting up the machine, but this has not occurred.  His family history includes strokes and heart attacks. He is married with one child and has a background in Scientist, research (medical) and repair work. He grew up in  [place], [place] , and attended one year of college.  Social Hx: - Born Armed forces operational officer, grew up [employer] - 1 year college, worked in Scientist, research (medical) - Married and 1 son  Patient Active Problem List  Diagnosis  . Essential hypertension  . Obesity  . Depression with anxiety  . Osteoarthritis  . Arthrosis of shoulder  . Arthrosis of knee  . Osteoarthritis of both knees, unspecified osteoarthritis type  . Benign prostatic hypertrophy with lower urinary tract symptoms (LUTS)  . Obstructive sleep apnea  . Presence of artificial shoulder joint  . Coronary arteriosclerosis  . Pseudoaneurysm following procedure  . Cerebrovascular small vessel disease  . Degeneration of lumbar intervertebral disc  . Dizziness  . Fibromyalgia  . History of hypotension  . Hypokalemia  . Impaired mobility  . Left heart failure (CMS/HHS-HCC)  . Long term current use of aspirin   . Mixed hyperlipidemia  . Multiple actinic keratoses  . Myocardial injury  . Not for resuscitation  . Polyneuropathy  . Peripheral venous insufficiency  . Recurrent falls  . Recurrent major depressive disorder, in remission ()  . Seborrheic keratosis  . Stage 2 chronic kidney disease  . Stasis dermatitis  . Disorder of connective tissue (HHS-HCC)  . Hypertensive emergency  . Acute kidney injury superimposed on stage 2 chronic kidney disease  . S/P drug eluting coronary stent placement     Past Medical History:  Diagnosis Date  . Abnormal EKG   . Acute kidney injury superimposed on chronic kidney disease 04/16/2024  . Allergic state   . Arthrosis of knee   . Arthrosis of shoulder   .  Benign prostatic hyperplasia with lower urinary tract symptoms   . BPH (benign prostatic hypertrophy)   . Cataract cortical, senile   . Coronary artery disease involving native coronary artery of native heart with angina pectoris 11/19/2023  . Depression   . DJD (degenerative joint disease)   . Fall at home, initial encounter  04/16/2024  . Heart murmur   . Hypertension   . Obesity   . Obstructive sleep apnea   . Osteoarthritis of both knees, unspecified osteoarthritis type   . Pseudoaneurysm following procedure   . Psoriasis   . Smoking   . Varicella      Past Surgical History:  Procedure Laterality Date  . Left total hip arthroplasty using [employer] prosthesis, femoral stem size 79M, acetabular cup size 54 mm with a 0 degree liner, head size 28 mm plus 0.   10/29/1995   Dr Debby SHAUNNA Benne , [facility]  . Right total hip arthroplasty   01/08/2013   Dr Mardee  . ER-Right hip reduction under conscious sedation  10/01/2023   Dr Zafonte  . LEFT HEART CATH AND CORONARY ANGIOGRAPHY  11/14/2023  . REPAIR FEMORAL ARTERY ANEURYSM/PSEUDOANEURYSM Right 12/27/2023   Procedure: DIRECT REPAIR OF ANEURYSM, PSEUDOANEURYSM, OR EXCISION (PARTIAL/TOTAL) AND GRAFT INSERT, WITH/WITHOUT PATCH GRAFT; FOR ANEURYSM, PSEUDOANEURYSM, AND ASSO OCCLUSIVE DISEASE, COMMON FEMORAL ARTERY;  Surgeon: Darra Dozier Blunt, MD;  Location: [facility];  Service: General Surgery;  Laterality: Right;  . CHOLECYSTECTOMY      Social History   Socioeconomic History  . Marital status: Married  . Number of children: 1  . Highest education level: Some college, no degree  Occupational History  . Occupation: Retired  Tobacco Use  . Smoking status: Former    Current packs/day: 0.00    Average packs/day: 1 pack/day for 32.0 years (32.0 ttl pk-yrs)    Types: Cigarettes    Start date: 68    Quit date: 2000    Years since quitting: 25.8    Passive exposure: Past  . Smokeless tobacco: Never  Vaping Use  . Vaping status: Never Used  Substance and Sexual Activity  . Alcohol use: No    Alcohol/week: 0.0 standard drinks of alcohol  . Drug use: Never  . Sexual activity: Not Currently  Social History Narrative   06/23/2024 Lives at home with wife. Denies any guns at home.    Social Drivers of Corporate investment banker Strain: Low  Risk  (06/24/2024)   Overall Financial Resource Strain (CARDIA)   . Difficulty of Paying Living Expenses: Not hard at all  Food Insecurity: No Food Insecurity (06/24/2024)   Hunger Vital Sign   . Worried About Programme researcher, broadcasting/film/video in the Last Year: Never true   . Ran Out of Food in the Last Year: Never true  Transportation Needs: No Transportation Needs (06/24/2024)   PRAPARE - Transportation   . Lack of Transportation (Medical): No   . Lack of Transportation (Non-Medical): No  Physical Activity: Insufficiently Active (02/15/2022)   Received [facility]   Exercise Vital Sign   . On average, how many days per week do you engage in moderate to strenuous exercise (like a brisk walk)?: 7 days   . On average, how many minutes do you engage in exercise at this level?: 20 min  Stress: No Stress Concern Present (02/15/2022)   Received from [facility] - Occupational Stress Questionnaire   . Feeling of Stress :  Not at all  Social Connections: Socially Integrated (04/17/2024)   Received from [facility]   Social Connection and Isolation Panel   . In a typical week, how many times do you talk on the phone with family, friends, or neighbors?: More than three times a week   . How often do you get together with friends or relatives?: More than three times a week   . How often do you attend church or religious services?: More than 4 times per year   . Do you belong to any clubs or organizations such as church groups, unions, fraternal or athletic groups, or school groups?: Yes   . How often do you attend meetings of the clubs or organizations you belong to?: More than 4 times per year   . Are you married, widowed, divorced, separated, never married, or living with a partner?: Married  Housing Stability: Low Risk  (06/24/2024)   Housing Stability Vital Sign   . Unable to Pay for Housing in the Last Year: No   . Number of Times Moved in the Last Year: 0   .  Homeless in the Last Year: No    Family History  Problem Relation Name Age of Onset  . Breast cancer Mother    . High blood pressure (Hypertension) Father    . Prostate cancer Father    . Stroke Father    . No Known Problems Sister    . Tobacco abuse Brother    . No Known Problems Son        Current Outpatient Medications:  .  amLODIPine  (NORVASC ) 10 MG tablet, Take 1 tablet (10 mg total) by mouth once daily, Disp: 30 tablet, Rfl: 0 .  ascorbic acid, vitamin C, (VITAMIN C) 500 MG tablet, Take 500 mg by mouth once daily, Disp: , Rfl:  .  cholecalciferol (VITAMIN D3) 1000 unit tablet, Take 1,000 Units by mouth once daily, Disp: , Rfl:  .  citalopram  (CELEXA ) 20 MG tablet, Take 1 tablet (20 mg total) by mouth once daily for 360 days, Disp: 90 tablet, Rfl: 3 .  clopidogreL  (PLAVIX ) 75 mg tablet, Take 1 tablet (75 mg total) by mouth once daily, Disp: 90 tablet, Rfl: 1 .  cyanocobalamin  (VITAMIN B12) 100 MCG tablet, Take 100 mcg by mouth every morning before breakfast (0630), Disp: , Rfl:  .  finasteride  (PROSCAR ) 5 mg tablet, Take 5 mg by mouth once daily, Disp: , Rfl:  .  gabapentin  (NEURONTIN ) 100 MG capsule, Take 1 capsule (100 mg total) by mouth at bedtime, Disp: 90 capsule, Rfl: 1 .  hydrALAZINE  (APRESOLINE ) 100 MG tablet, Take 1 tablet (100 mg total) by mouth every 8 (eight) hours, Disp: 90 tablet, Rfl: 0 .  hydroCHLOROthiazide (HYDRODIURIL) 25 MG tablet, Take 1 tablet (25 mg total) by mouth once daily, Disp: 30 tablet, Rfl: 11 .  losartan  (COZAAR ) 100 MG tablet, Take 1 tablet (100 mg total) by mouth once daily, Disp: 60 tablet, Rfl: 0 .  meclizine  (ANTIVERT ) 25 mg tablet, Take 1 tablet (25 mg total) by mouth 3 (three) times daily as needed for Dizziness, Disp: 180 tablet, Rfl: 1 .  polyethylene glycol (MIRALAX ) packet, Take 17 g by mouth once daily, Disp: , Rfl:  .  spironolactone (ALDACTONE) 25 MG tablet, Take 1 tablet (25 mg total) by mouth once daily, Disp: 30 tablet, Rfl: 11 .   traMADoL  (ULTRAM ) 50 mg tablet, Take 1 tablet (50 mg total) by mouth 2 (two) times daily as needed  for Pain, Disp: 40 tablet, Rfl: 0 .  aspirin  81 MG EC tablet, Take 81 mg by mouth once daily (Patient not taking: Reported on 07/16/2024), Disp: , Rfl:  .  mirtazapine (REMERON) 15 MG tablet, Take 1 tablet (15 mg total) by mouth at bedtime for 90 days, Disp: 90 tablet, Rfl: 0 .  [Paused] rosuvastatin  (CRESTOR ) 20 MG tablet, Take 1 tablet (20 mg total) by mouth once daily (Patient not taking: Reported on 07/16/2024), Disp: 90 tablet, Rfl: 1   ROS: Memory changes      Objective:  BP 104/62 (BP Location: Left upper arm, Patient Position: Sitting, BP Cuff Size: Large Adult)   Pulse 64   Ht 188 cm (6' 2)   Wt 94.8 kg (209 lb)   SpO2 95%   BMI 26.83 kg/m   Physical Exam     Physical Examination:  GENERAL:  well appearing older male, fully alert, oriented and in no acute distress.  HEENT:  NCAT EOMI  CHEST:  Chest wall is within normal limits.   LUNGS:  CTAB CARDIAC:  Regular rate and rhythm, normal S1 and S2 without murmurs, rubs or gallops.   VASCULAR:  radial pulses 2+ ABDOMEN:  Soft, with normal bowel sounds.  No organomegaly or tenderness found.   EXTREMITIES:  Full range of motion with no erythema, heat or effusion.  No cyanosis, clubbing or edema noted.  NEUROLOGIC:  The patient is alert and oriented.  Cranial nerves II-XII intact.  Motor and sensory examinations within normal limits.      A/P  No orders of the defined types were placed in this encounter.   Diagnoses and all orders for this visit:  Essential hypertension  Memory changes  Mixed hyperlipidemia  Obstructive sleep apnea  Stage 2 chronic kidney disease  Coronary arteriosclerosis  S/P drug eluting coronary stent placement  Recurrent falls  Other orders -     citalopram  (CELEXA ) 20 MG tablet; Take 1 tablet (20 mg total) by mouth once daily for 360 days -     mirtazapine (REMERON) 15 MG tablet;  Take 1 tablet (15 mg total) by mouth at bedtime for 90 days    Assessment & Plan Hypertension with recent hypotension Recent episodes of hypotension with blood pressure readings in the 90s/80s following hospitalization for hypertensive urgency with blood pressure over 200. Current medications include amlodipine , spironolactone, losartan , hydrochlorothiazide, and hydralazine . Recent adjustments include increasing amlodipine  and adding spironolactone. Hydralazine  and hydrochlorothiazide are not preferred due to risk of dizziness and falls, especially in older adults. Emphasis on avoiding low blood pressure to prevent falls and dizziness. High blood pressure without symptoms is not immediately dangerous, but low blood pressure poses a higher risk for falls and dizziness. - Stop hydralazine  - Continue amlodipine , spironolactone, losartan , and hydrochlorothiazide - Monitor blood pressure daily and record - Notify provider via MyChart if blood pressure is consistently above 140/90 - Avoid checking blood pressure when sleepy or just waking up - Target blood pressure range: 110-140/60-80  Gait instability and falls Gait instability and falls, possibly exacerbated by hypotension and medication side effects. Physical therapy was initiated post-hospitalization but has since stopped. Use of a walking cane noted, but a walker is preferred for safety. - Refer to outpatient physical therapy for regular sessions  Coronary artery disease with prior stent placement Coronary artery disease with four stents placed in March 2025. Recent hospitalizations for uncontrolled hypertension. Currently on Plavix , but aspirin  was stopped due to recurrent bruising. Statin was discontinued due to  side effects as advised by cardiologist. - Continue Plavix  - Discontinue aspirin  as per cardiologist's advice - Follows with Cardiology  Depression and anxiety Recent increase in citalopram  dose to 40 mg, which is not recommended  for patients over 65 due to risk of QT prolongation and slower kidney function. Depression and anxiety exacerbated by recent health issues. Gabapentin  added for pain and sleep issues. Mirtazapine suggested for mood and sleep improvement. - Reduce citalopram  to 20 mg daily - Start mirtazapine 15 mg nightly, increase to 30 mg if no benefit in 1-2 weeks - Monitor mood and anxiety symptoms - Continue gabapentin  for pain and sleep  Chronic neck and back pain Chronic neck and back pain, worsened at night, possibly related to prior surgeries. Gabapentin  recently started to address pain and improve sleep. - Continue gabapentin , monitor for dizziness and drowsiness - Advise taking gabapentin  after using the bathroom and before bed  Obstructive sleep apnea, untreated Obstructive sleep apnea with CPAP machine not currently in use due to lack of setup assistance. He acknowledges the need to use the machine. - Arrange for assistance to set up CPAP machine    Return in about 2 months (around 09/15/2024).  I personally spent 45 minutes face-to-face and non-face-to-face in the care of this patient, which includes all pre, intra, and post visit time on the date of service.   Doretta Urbano Button, MD  This note has been created using automated tools and reviewed for accuracy by ULYSSES NAJIN TOCHE.

## 2024-07-17 ENCOUNTER — Ambulatory Visit: Admission: RE | Admit: 2024-07-17 | Source: Ambulatory Visit

## 2024-07-17 DIAGNOSIS — N179 Acute kidney failure, unspecified: Secondary | ICD-10-CM | POA: Diagnosis not present

## 2024-07-17 DIAGNOSIS — I5030 Unspecified diastolic (congestive) heart failure: Secondary | ICD-10-CM | POA: Diagnosis not present

## 2024-07-17 DIAGNOSIS — G9389 Other specified disorders of brain: Secondary | ICD-10-CM | POA: Diagnosis not present

## 2024-07-17 DIAGNOSIS — N182 Chronic kidney disease, stage 2 (mild): Secondary | ICD-10-CM | POA: Diagnosis not present

## 2024-07-17 DIAGNOSIS — G9340 Encephalopathy, unspecified: Secondary | ICD-10-CM | POA: Diagnosis not present

## 2024-07-17 DIAGNOSIS — I130 Hypertensive heart and chronic kidney disease with heart failure and stage 1 through stage 4 chronic kidney disease, or unspecified chronic kidney disease: Secondary | ICD-10-CM | POA: Diagnosis not present

## 2024-07-20 ENCOUNTER — Inpatient Hospital Stay
Admission: EM | Admit: 2024-07-20 | Discharge: 2024-07-23 | DRG: 560 | Disposition: A | Discharge status: Skilled Nursing Facility | Attending: Internal Medicine | Admitting: Internal Medicine

## 2024-07-20 ENCOUNTER — Inpatient Hospital Stay: Admitting: Certified Registered"

## 2024-07-20 ENCOUNTER — Encounter: Payer: Self-pay | Admitting: Emergency Medicine

## 2024-07-20 ENCOUNTER — Inpatient Hospital Stay

## 2024-07-20 ENCOUNTER — Encounter
Admission: EM | Disposition: A | Discharge status: Skilled Nursing Facility | Payer: Self-pay | Source: Home / Self Care | Attending: Internal Medicine

## 2024-07-20 ENCOUNTER — Other Ambulatory Visit: Payer: Self-pay

## 2024-07-20 ENCOUNTER — Emergency Department

## 2024-07-20 DIAGNOSIS — W19XXXA Unspecified fall, initial encounter: Secondary | ICD-10-CM | POA: Diagnosis not present

## 2024-07-20 DIAGNOSIS — Z736 Limitation of activities due to disability: Secondary | ICD-10-CM | POA: Diagnosis not present

## 2024-07-20 DIAGNOSIS — W182XXA Fall in (into) shower or empty bathtub, initial encounter: Secondary | ICD-10-CM | POA: Diagnosis present

## 2024-07-20 DIAGNOSIS — X501XXA Overexertion from prolonged static or awkward postures, initial encounter: Secondary | ICD-10-CM | POA: Diagnosis not present

## 2024-07-20 DIAGNOSIS — Z7982 Long term (current) use of aspirin: Secondary | ICD-10-CM

## 2024-07-20 DIAGNOSIS — D72829 Elevated white blood cell count, unspecified: Secondary | ICD-10-CM | POA: Diagnosis present

## 2024-07-20 DIAGNOSIS — I2510 Atherosclerotic heart disease of native coronary artery without angina pectoris: Secondary | ICD-10-CM | POA: Diagnosis present

## 2024-07-20 DIAGNOSIS — K5900 Constipation, unspecified: Secondary | ICD-10-CM | POA: Diagnosis not present

## 2024-07-20 DIAGNOSIS — I129 Hypertensive chronic kidney disease with stage 1 through stage 4 chronic kidney disease, or unspecified chronic kidney disease: Secondary | ICD-10-CM | POA: Diagnosis present

## 2024-07-20 DIAGNOSIS — F32A Depression, unspecified: Secondary | ICD-10-CM | POA: Diagnosis present

## 2024-07-20 DIAGNOSIS — R52 Pain, unspecified: Secondary | ICD-10-CM | POA: Diagnosis not present

## 2024-07-20 DIAGNOSIS — M62838 Other muscle spasm: Secondary | ICD-10-CM | POA: Diagnosis not present

## 2024-07-20 DIAGNOSIS — Z743 Need for continuous supervision: Secondary | ICD-10-CM | POA: Diagnosis not present

## 2024-07-20 DIAGNOSIS — Z96612 Presence of left artificial shoulder joint: Secondary | ICD-10-CM | POA: Diagnosis present

## 2024-07-20 DIAGNOSIS — T84020A Dislocation of internal right hip prosthesis, initial encounter: Principal | ICD-10-CM | POA: Diagnosis present

## 2024-07-20 DIAGNOSIS — Z66 Do not resuscitate: Secondary | ICD-10-CM | POA: Diagnosis present

## 2024-07-20 DIAGNOSIS — N189 Chronic kidney disease, unspecified: Secondary | ICD-10-CM | POA: Diagnosis present

## 2024-07-20 DIAGNOSIS — Z885 Allergy status to narcotic agent status: Secondary | ICD-10-CM

## 2024-07-20 DIAGNOSIS — N400 Enlarged prostate without lower urinary tract symptoms: Secondary | ICD-10-CM | POA: Diagnosis present

## 2024-07-20 DIAGNOSIS — Z87891 Personal history of nicotine dependence: Secondary | ICD-10-CM | POA: Diagnosis not present

## 2024-07-20 DIAGNOSIS — R54 Age-related physical debility: Secondary | ICD-10-CM | POA: Diagnosis present

## 2024-07-20 DIAGNOSIS — R42 Dizziness and giddiness: Secondary | ICD-10-CM | POA: Diagnosis not present

## 2024-07-20 DIAGNOSIS — Z823 Family history of stroke: Secondary | ICD-10-CM | POA: Diagnosis not present

## 2024-07-20 DIAGNOSIS — N401 Enlarged prostate with lower urinary tract symptoms: Secondary | ICD-10-CM | POA: Diagnosis not present

## 2024-07-20 DIAGNOSIS — R41841 Cognitive communication deficit: Secondary | ICD-10-CM | POA: Diagnosis not present

## 2024-07-20 DIAGNOSIS — M6259 Muscle wasting and atrophy, not elsewhere classified, multiple sites: Secondary | ICD-10-CM | POA: Diagnosis not present

## 2024-07-20 DIAGNOSIS — Z7902 Long term (current) use of antithrombotics/antiplatelets: Secondary | ICD-10-CM

## 2024-07-20 DIAGNOSIS — Z9181 History of falling: Secondary | ICD-10-CM

## 2024-07-20 DIAGNOSIS — S73004A Unspecified dislocation of right hip, initial encounter: Secondary | ICD-10-CM | POA: Diagnosis not present

## 2024-07-20 DIAGNOSIS — Z751 Person awaiting admission to adequate facility elsewhere: Secondary | ICD-10-CM

## 2024-07-20 DIAGNOSIS — I10 Essential (primary) hypertension: Secondary | ICD-10-CM

## 2024-07-20 DIAGNOSIS — M797 Fibromyalgia: Secondary | ICD-10-CM | POA: Diagnosis present

## 2024-07-20 DIAGNOSIS — Z79899 Other long term (current) drug therapy: Secondary | ICD-10-CM

## 2024-07-20 DIAGNOSIS — G9340 Encephalopathy, unspecified: Secondary | ICD-10-CM | POA: Diagnosis not present

## 2024-07-20 DIAGNOSIS — Z803 Family history of malignant neoplasm of breast: Secondary | ICD-10-CM | POA: Diagnosis not present

## 2024-07-20 DIAGNOSIS — N289 Disorder of kidney and ureter, unspecified: Secondary | ICD-10-CM | POA: Diagnosis not present

## 2024-07-20 DIAGNOSIS — I4510 Unspecified right bundle-branch block: Secondary | ICD-10-CM | POA: Diagnosis present

## 2024-07-20 DIAGNOSIS — E785 Hyperlipidemia, unspecified: Secondary | ICD-10-CM | POA: Diagnosis present

## 2024-07-20 DIAGNOSIS — Z741 Need for assistance with personal care: Secondary | ICD-10-CM | POA: Diagnosis not present

## 2024-07-20 DIAGNOSIS — Z6825 Body mass index (BMI) 25.0-25.9, adult: Secondary | ICD-10-CM

## 2024-07-20 DIAGNOSIS — S73014A Posterior dislocation of right hip, initial encounter: Secondary | ICD-10-CM | POA: Diagnosis present

## 2024-07-20 DIAGNOSIS — G629 Polyneuropathy, unspecified: Secondary | ICD-10-CM | POA: Diagnosis present

## 2024-07-20 DIAGNOSIS — R509 Fever, unspecified: Secondary | ICD-10-CM | POA: Diagnosis not present

## 2024-07-20 DIAGNOSIS — G4733 Obstructive sleep apnea (adult) (pediatric): Secondary | ICD-10-CM | POA: Diagnosis not present

## 2024-07-20 DIAGNOSIS — W19XXXD Unspecified fall, subsequent encounter: Secondary | ICD-10-CM | POA: Diagnosis not present

## 2024-07-20 DIAGNOSIS — N179 Acute kidney failure, unspecified: Secondary | ICD-10-CM | POA: Diagnosis present

## 2024-07-20 DIAGNOSIS — I5030 Unspecified diastolic (congestive) heart failure: Secondary | ICD-10-CM | POA: Diagnosis not present

## 2024-07-20 DIAGNOSIS — K219 Gastro-esophageal reflux disease without esophagitis: Secondary | ICD-10-CM | POA: Diagnosis present

## 2024-07-20 DIAGNOSIS — Y792 Prosthetic and other implants, materials and accessory orthopedic devices associated with adverse incidents: Secondary | ICD-10-CM | POA: Diagnosis present

## 2024-07-20 DIAGNOSIS — Z7401 Bed confinement status: Secondary | ICD-10-CM | POA: Diagnosis not present

## 2024-07-20 DIAGNOSIS — R2681 Unsteadiness on feet: Secondary | ICD-10-CM | POA: Diagnosis not present

## 2024-07-20 DIAGNOSIS — Z96642 Presence of left artificial hip joint: Secondary | ICD-10-CM | POA: Diagnosis present

## 2024-07-20 DIAGNOSIS — S73014D Posterior dislocation of right hip, subsequent encounter: Secondary | ICD-10-CM | POA: Diagnosis not present

## 2024-07-20 DIAGNOSIS — Z8249 Family history of ischemic heart disease and other diseases of the circulatory system: Secondary | ICD-10-CM

## 2024-07-20 DIAGNOSIS — E569 Vitamin deficiency, unspecified: Secondary | ICD-10-CM | POA: Diagnosis not present

## 2024-07-20 DIAGNOSIS — Z91040 Latex allergy status: Secondary | ICD-10-CM

## 2024-07-20 DIAGNOSIS — F321 Major depressive disorder, single episode, moderate: Secondary | ICD-10-CM | POA: Diagnosis not present

## 2024-07-20 DIAGNOSIS — F419 Anxiety disorder, unspecified: Secondary | ICD-10-CM | POA: Diagnosis present

## 2024-07-20 DIAGNOSIS — M25551 Pain in right hip: Secondary | ICD-10-CM | POA: Diagnosis not present

## 2024-07-20 DIAGNOSIS — Z96641 Presence of right artificial hip joint: Secondary | ICD-10-CM | POA: Diagnosis not present

## 2024-07-20 DIAGNOSIS — R944 Abnormal results of kidney function studies: Secondary | ICD-10-CM | POA: Diagnosis not present

## 2024-07-20 HISTORY — PX: HIP CLOSED REDUCTION: SHX983

## 2024-07-20 LAB — CBC WITH DIFFERENTIAL/PLATELET
Abs Immature Granulocytes: 0.04 K/uL (ref 0.00–0.07)
Basophils Absolute: 0 K/uL (ref 0.0–0.1)
Basophils Relative: 0 %
Eosinophils Absolute: 0.2 K/uL (ref 0.0–0.5)
Eosinophils Relative: 1 %
HCT: 39 % (ref 39.0–52.0)
Hemoglobin: 13.2 g/dL (ref 13.0–17.0)
Immature Granulocytes: 0 %
Lymphocytes Relative: 11 %
Lymphs Abs: 1.5 K/uL (ref 0.7–4.0)
MCH: 30.6 pg (ref 26.0–34.0)
MCHC: 33.8 g/dL (ref 30.0–36.0)
MCV: 90.5 fL (ref 80.0–100.0)
Monocytes Absolute: 1 K/uL (ref 0.1–1.0)
Monocytes Relative: 7 %
Neutro Abs: 10.7 K/uL — ABNORMAL HIGH (ref 1.7–7.7)
Neutrophils Relative %: 81 %
Platelets: 251 K/uL (ref 150–400)
RBC: 4.31 MIL/uL (ref 4.22–5.81)
RDW: 12.9 % (ref 11.5–15.5)
WBC: 13.4 K/uL — ABNORMAL HIGH (ref 4.0–10.5)
nRBC: 0 % (ref 0.0–0.2)

## 2024-07-20 LAB — BASIC METABOLIC PANEL WITH GFR
Anion gap: 12 (ref 5–15)
BUN: 31 mg/dL — ABNORMAL HIGH (ref 8–23)
CO2: 28 mmol/L (ref 22–32)
Calcium: 9.2 mg/dL (ref 8.9–10.3)
Chloride: 102 mmol/L (ref 98–111)
Creatinine, Ser: 1.74 mg/dL — ABNORMAL HIGH (ref 0.61–1.24)
GFR, Estimated: 40 mL/min — ABNORMAL LOW (ref >60)
Glucose, Bld: 112 mg/dL — ABNORMAL HIGH (ref 70–99)
Potassium: 3.5 mmol/L (ref 3.5–5.1)
Sodium: 142 mmol/L (ref 135–145)

## 2024-07-20 LAB — PROTIME-INR
INR: 1 (ref 0.8–1.2)
Prothrombin Time: 13.9 s (ref 11.4–15.2)

## 2024-07-20 SURGERY — CLOSED REDUCTION, HIP
Anesthesia: General | Site: Hip | Laterality: Right

## 2024-07-20 MED ORDER — FENTANYL CITRATE (PF) 100 MCG/2ML IJ SOLN: 25.0000 ug | INTRAMUSCULAR | Status: DC | PRN

## 2024-07-20 MED ORDER — AMLODIPINE BESYLATE 10 MG PO TABS
10.0000 mg | ORAL_TABLET | Freq: Every day | ORAL | Status: DC
  Administered 2024-07-21 – 2024-07-23 (×3): 10 mg via ORAL
  Filled 2024-07-20 (×3): qty 1

## 2024-07-20 MED ORDER — HYDRALAZINE HCL 20 MG/ML IJ SOLN
10.0000 mg | Freq: Once | INTRAMUSCULAR | Status: AC
  Administered 2024-07-20: 10 mg via INTRAVENOUS

## 2024-07-20 MED ORDER — HYDRALAZINE HCL 50 MG PO TABS
50.0000 mg | ORAL_TABLET | Freq: Three times a day (TID) | ORAL | Status: DC | PRN
Start: 2024-07-20 — End: 2024-07-21

## 2024-07-20 MED ORDER — ONDANSETRON HCL 4 MG/2ML IJ SOLN: 4.0000 mg | Freq: Four times a day (QID) | INTRAMUSCULAR | Status: DC | PRN

## 2024-07-20 MED ORDER — FESOTERODINE FUMARATE ER 4 MG PO TB24: 4.0000 mg | ORAL_TABLET | Freq: Every day | ORAL | Status: DC

## 2024-07-20 MED ORDER — PROPOFOL 10 MG/ML IV BOLUS
INTRAVENOUS | Status: AC | PRN
Start: 2024-07-20 — End: 2024-07-20
  Administered 2024-07-20: 42.5 mg via INTRAVENOUS
  Administered 2024-07-20: 42.8 mg via INTRAVENOUS
  Administered 2024-07-20: 42.5 mg via INTRAVENOUS

## 2024-07-20 MED ORDER — LOSARTAN POTASSIUM 50 MG PO TABS
100.0000 mg | ORAL_TABLET | Freq: Every day | ORAL | Status: DC
  Administered 2024-07-21 – 2024-07-23 (×3): 100 mg via ORAL
  Filled 2024-07-20 (×3): qty 2

## 2024-07-20 MED ORDER — LABETALOL HCL 100 MG PO TABS
100.0000 mg | ORAL_TABLET | Freq: Two times a day (BID) | ORAL | Status: DC
Start: 2024-07-20 — End: 2024-07-21

## 2024-07-20 MED ORDER — MAGNESIUM HYDROXIDE 400 MG/5ML PO SUSP: 30.0000 mL | Freq: Every day | ORAL | Status: DC | PRN

## 2024-07-20 MED ORDER — HYDROMORPHONE HCL 1 MG/ML IJ SOLN
1.0000 mg | Freq: Once | INTRAMUSCULAR | Status: AC
  Administered 2024-07-20: 1 mg via INTRAVENOUS
  Filled 2024-07-20: qty 1

## 2024-07-20 MED ORDER — FINASTERIDE 5 MG PO TABS
5.0000 mg | ORAL_TABLET | Freq: Every day | ORAL | Status: DC
  Administered 2024-07-21 – 2024-07-23 (×3): 5 mg via ORAL
  Filled 2024-07-20 (×4): qty 1

## 2024-07-20 MED ORDER — DROPERIDOL 2.5 MG/ML IJ SOLN: 0.6250 mg | Freq: Once | INTRAMUSCULAR | Status: DC | PRN

## 2024-07-20 MED ORDER — FENTANYL CITRATE (PF) 100 MCG/2ML IJ SOLN
INTRAMUSCULAR | Status: AC
  Filled 2024-07-20: qty 2

## 2024-07-20 MED ORDER — HYDRALAZINE HCL 20 MG/ML IJ SOLN
10.0000 mg | Freq: Four times a day (QID) | INTRAMUSCULAR | Status: DC | PRN
  Administered 2024-07-21: 10 mg via INTRAVENOUS
  Filled 2024-07-20 (×2): qty 1

## 2024-07-20 MED ORDER — VITAMIN B-12 100 MCG PO TABS
100.0000 ug | ORAL_TABLET | Freq: Every day | ORAL | Status: DC
  Administered 2024-07-21 – 2024-07-23 (×3): 100 ug via ORAL
  Filled 2024-07-20 (×4): qty 1

## 2024-07-20 MED ORDER — VITAMIN C 500 MG PO TABS
500.0000 mg | ORAL_TABLET | Freq: Every day | ORAL | Status: DC
  Administered 2024-07-21 – 2024-07-23 (×3): 500 mg via ORAL
  Filled 2024-07-20 (×3): qty 1

## 2024-07-20 MED ORDER — ACETAMINOPHEN 10 MG/ML IV SOLN: 1000.0000 mg | Freq: Once | INTRAVENOUS | Status: DC | PRN

## 2024-07-20 MED ORDER — GABAPENTIN 100 MG PO CAPS
100.0000 mg | ORAL_CAPSULE | Freq: Every day | ORAL | Status: DC
  Administered 2024-07-21 – 2024-07-22 (×3): 100 mg via ORAL
  Filled 2024-07-20 (×3): qty 1

## 2024-07-20 MED ORDER — OXYCODONE HCL 5 MG/5ML PO SOLN: 5.0000 mg | Freq: Once | ORAL | Status: AC | PRN

## 2024-07-20 MED ORDER — POLYETHYLENE GLYCOL 3350 17 G PO PACK
17.0000 g | PACK | Freq: Every day | ORAL | Status: DC
  Administered 2024-07-21 – 2024-07-23 (×3): 17 g via ORAL
  Filled 2024-07-20 (×4): qty 1

## 2024-07-20 MED ORDER — SUCCINYLCHOLINE CHLORIDE 200 MG/10ML IV SOSY
PREFILLED_SYRINGE | INTRAVENOUS | Status: DC | PRN
  Administered 2024-07-20: 100 mg via INTRAVENOUS

## 2024-07-20 MED ORDER — PROPOFOL 10 MG/ML IV BOLUS
0.5000 mg/kg | Freq: Once | INTRAVENOUS | Status: DC
  Filled 2024-07-20: qty 20

## 2024-07-20 MED ORDER — TRAZODONE HCL 50 MG PO TABS: 25.0000 mg | ORAL_TABLET | Freq: Every evening | ORAL | Status: DC | PRN

## 2024-07-20 MED ORDER — FENTANYL CITRATE (PF) 100 MCG/2ML IJ SOLN
INTRAMUSCULAR | Status: DC | PRN
  Administered 2024-07-20: 50 ug via INTRAVENOUS

## 2024-07-20 MED ORDER — PROPOFOL 10 MG/ML IV BOLUS
1.0000 mg/kg | Freq: Once | INTRAVENOUS | Status: DC
  Filled 2024-07-20: qty 20

## 2024-07-20 MED ORDER — PROPOFOL 10 MG/ML IV BOLUS
INTRAVENOUS | Status: AC
  Filled 2024-07-20: qty 20

## 2024-07-20 MED ORDER — VITAMIN D 25 MCG (1000 UNIT) PO TABS
1000.0000 [IU] | ORAL_TABLET | Freq: Every day | ORAL | Status: DC
  Administered 2024-07-21 – 2024-07-23 (×3): 1000 [IU] via ORAL
  Filled 2024-07-20 (×3): qty 1

## 2024-07-20 MED ORDER — PROPOFOL 500 MG/50ML IV EMUL
INTRAVENOUS | Status: DC | PRN
Start: 2024-07-20 — End: 2024-07-20
  Administered 2024-07-20: 50 ug via INTRAVENOUS
  Administered 2024-07-20: 100 ug via INTRAVENOUS

## 2024-07-20 MED ORDER — MECLIZINE HCL 25 MG PO TABS: 12.5000 mg | ORAL_TABLET | Freq: Three times a day (TID) | ORAL | Status: DC | PRN

## 2024-07-20 MED ORDER — ONDANSETRON HCL 4 MG PO TABS: 4.0000 mg | ORAL_TABLET | Freq: Four times a day (QID) | ORAL | Status: DC | PRN

## 2024-07-20 MED ORDER — CITALOPRAM HYDROBROMIDE 10 MG PO TABS
20.0000 mg | ORAL_TABLET | Freq: Every day | ORAL | Status: DC
  Administered 2024-07-21 – 2024-07-23 (×3): 20 mg via ORAL
  Filled 2024-07-20: qty 2
  Filled 2024-07-20: qty 1
  Filled 2024-07-20 (×2): qty 2

## 2024-07-20 MED ORDER — LABETALOL HCL 5 MG/ML IV SOLN: 20.0000 mg | INTRAVENOUS | Status: DC | PRN

## 2024-07-20 MED ORDER — OXYCODONE HCL 5 MG PO TABS
ORAL_TABLET | ORAL | Status: AC
  Filled 2024-07-20: qty 1

## 2024-07-20 MED ORDER — SODIUM CHLORIDE 0.9 % IV SOLN: INTRAVENOUS | Status: DC

## 2024-07-20 MED ORDER — ROSUVASTATIN CALCIUM 20 MG PO TABS
20.0000 mg | ORAL_TABLET | Freq: Every day | ORAL | Status: DC
Start: 2024-07-21 — End: 2024-07-21

## 2024-07-20 MED ORDER — OXYCODONE HCL 5 MG PO TABS: 5.0000 mg | ORAL_TABLET | ORAL | Status: DC | PRN

## 2024-07-20 MED ORDER — HYDRALAZINE HCL 20 MG/ML IJ SOLN
INTRAMUSCULAR | Status: AC
  Filled 2024-07-20: qty 1

## 2024-07-20 MED ORDER — DOCUSATE SODIUM 100 MG PO CAPS
100.0000 mg | ORAL_CAPSULE | Freq: Two times a day (BID) | ORAL | Status: DC
Start: 2024-07-21 — End: 2024-07-21

## 2024-07-20 MED ORDER — OXYCODONE HCL 5 MG PO TABS
5.0000 mg | ORAL_TABLET | Freq: Once | ORAL | Status: AC | PRN
  Administered 2024-07-20: 5 mg via ORAL

## 2024-07-20 SURGICAL SUPPLY — 5 items
IMMBOLIZER KNEE 19 BLUE UNIV (SOFTGOODS) IMPLANT
KIT TURNOVER KIT A (KITS) IMPLANT
MANIFOLD NEPTUNE II (INSTRUMENTS) ×1 IMPLANT
TRAP FLUID SMOKE EVACUATOR (MISCELLANEOUS) ×1 IMPLANT
WATER STERILE IRR 500ML POUR (IV SOLUTION) ×1 IMPLANT

## 2024-07-20 NOTE — ED Notes (Signed)
OR team here to transport pt

## 2024-07-20 NOTE — ED Notes (Signed)
 OR called for report

## 2024-07-20 NOTE — Assessment & Plan Note (Signed)
-   Will continue Neurontin .

## 2024-07-20 NOTE — Transfer of Care (Signed)
 Immediate Anesthesia Transfer of Care Note  Patient: Joe Patton.  Procedure(s) Performed: CLOSED REDUCTION, HIP (Hip)  Patient Location: PACU  Anesthesia Type:General  Level of Consciousness: awake, alert , and oriented  Airway & Oxygen Therapy: Patient Spontanous Breathing and Patient connected to face mask oxygen  Post-op Assessment: Report given to RN, Post -op Vital signs reviewed and stable, and Patient moving all extremities  Post vital signs: Reviewed and stable  Last Vitals:  Vitals Value Taken Time  BP 161/99 07/20/24 22:39  Temp 36.8 C 07/20/24 22:39  Pulse 79 07/20/24 22:41  Resp 15 07/20/24 22:41  SpO2 100 % 07/20/24 22:41  Vitals shown include unfiled device data.  Last Pain:  Vitals:   07/20/24 2057  TempSrc: Oral  PainSc: 8          Complications: No notable events documented.

## 2024-07-20 NOTE — Assessment & Plan Note (Addendum)
 Dislocation of the right total hip arthroplasty with subsequent mechanical fall without head injuries.  History of multiple prior dislocations and falls, s/p closed reduction under general anesthesia in OR by orthopedic surgery. PT and OT are recommending SNF - Continue with pain management and supportive care

## 2024-07-20 NOTE — ED Provider Notes (Signed)
 [facility] Provider Note    Event Date/Time   First MD Initiated Contact with Patient 07/20/24 1827     (approximate)   History    HPI  Joe P Chevelle Durr. is a 78 y.o. male with a history of CAD on Plavix , chronic kidney disease, and hypertension who presents with a right hip injury, acute onset when the patient fell while in the bathtub.  He states it feels like it is dislocated.  This has happened once before.  He has bilateral hip replacements.  He did not hit his head.  He has no neck or back pain.  I reviewed the past medical records.  The patient was admitted to the hospitalist service at [facility] last month with a hypertensive emergency.   Physical Exam   Triage Vital Signs: ED Triage Vitals [07/20/24 1825]  Encounter Vitals Group     BP (!) 170/93     Girls Systolic BP Percentile      Girls Diastolic BP Percentile      Boys Systolic BP Percentile      Boys Diastolic BP Percentile      Pulse Rate 80     Resp 16     Temp 98.1 F (36.7 C)     Temp src      SpO2 96 %     Weight 200 lb (90.7 kg)     Height 6' 2 (1.88 m)     Head Circumference      Peak Flow      Pain Score 9     Pain Loc      Pain Education      Exclude from Growth Chart     Most recent vital signs: Vitals:   07/20/24 2245 07/20/24 2300  BP: (!) 178/101 (!) 169/92  Pulse: 81 83  Resp: 11 16  Temp:    SpO2: 100% 94%     General: Alert, uncomfortable appearing, no distress.  CV:  Good peripheral perfusion.  Resp:  Normal effort.  Abd:  No distention.  Other:  Pain on any ROM of right hip.  2+ DP pulse.  Motor and sensory intact to the distal right leg.   ED Results / Procedures / Treatments   Labs (all labs ordered are listed, but only abnormal results are displayed) Labs Reviewed  BASIC METABOLIC PANEL WITH GFR - Abnormal; Notable for the following components:      Result Value   Glucose, Bld 112 (*)    BUN 31 (*)    Creatinine, Ser 1.74 (*)    GFR,  Estimated 40 (*)    All other components within normal limits  CBC WITH DIFFERENTIAL/PLATELET - Abnormal; Notable for the following components:   WBC 13.4 (*)    Neutro Abs 10.7 (*)    All other components within normal limits  PROTIME-INR  BASIC METABOLIC PANEL WITH GFR  CBC     EKG  ED ECG REPORT I, Waylon Cassis, the attending physician, personally viewed and interpreted this ECG.  Date: 07/20/2024 EKG Time: 1830 Rate: 77 or Rhythm: normal sinus rhythm QRS Axis: normal Intervals: RBBB ST/T Wave abnormalities: normal Narrative Interpretation: no evidence of acute ischemia    RADIOLOGY  XR R hip: I independently viewed and interpreted the images; there is a posterior right hip dislocation  PROCEDURES:  Critical Care performed: No  .Sedation  Date/Time: 07/20/2024 9:33 PM  Performed by: Cassis Waylon, MD Authorized by: Cassis Waylon, MD   Consent:  Consent obtained:  Written   Consent given by:  Patient   Risks discussed:  Allergic reaction, dysrhythmia, prolonged sedation necessitating reversal, prolonged hypoxia resulting in organ damage, inadequate sedation, respiratory compromise necessitating ventilatory assistance and intubation and vomiting   Alternatives discussed:  Analgesia without sedation Universal protocol:    Immediately prior to procedure, a time out was called: yes   Pre-sedation assessment:    Time since last food or drink:  8 hours   ASA classification: class 2 - patient with mild systemic disease     Mallampati score:  I - soft palate, uvula, fauces, pillars visible   Pre-sedation assessments completed and reviewed: airway patency, cardiovascular function, mental status, nausea/vomiting, pain level and respiratory function   A pre-sedation assessment was completed prior to the start of the procedure Procedure details (see MAR for exact dosages):    Preoxygenation:  Nasal cannula   Sedation:  Propofol    Intended level of  sedation: deep   Intra-procedure monitoring:  Blood pressure monitoring, continuous capnometry, cardiac monitor, continuous pulse oximetry, frequent vital sign checks and frequent LOC assessments   Intra-procedure events: none     Total Provider sedation time (minutes):  10 Post-procedure details:   A post-sedation assessment was completed following the completion of the procedure.   Attendance: Constant attendance by certified staff until patient recovered     Recovery: Patient returned to pre-procedure baseline     Procedure completion:  Tolerated well, no immediate complications .Ortho Injury Treatment  Date/Time: 07/20/2024 9:34 PM  Performed by: Jacolyn Pae, MD Authorized by: Jacolyn Pae, MD   Consent:    Consent obtained:  Written   Consent given by:  Patient   Risks discussed:  Fracture, irreducible dislocation, recurrent dislocation, nerve damage, restricted joint movement, stiffness and vascular damage   Alternatives discussed:  Immobilization and delayed treatmentInjury location: hip Location details: right hip Injury type: dislocation Dislocation type: posterior Prosthesis: yes Pre-procedure neurovascular assessment: neurovascularly intact  Patient sedated: Yes. Refer to sedation procedure documentation for details of sedation. Manipulation performed: yes Reduction method: Allis maneuver and external rotation Reduction successful: no Post-procedure neurovascular assessment: post-procedure neurovascularly intact      MEDICATIONS ORDERED IN ED: Medications  propofol  (DIPRIVAN ) 10 mg/mL bolus/IV push 85.6 mg ( Intravenous MAR Hold 07/20/24 2137)  propofol  (DIPRIVAN ) 10 mg/mL bolus/IV push 42.8 mg ( Intravenous MAR Hold 07/20/24 2137)  oxyCODONE  (Oxy IR/ROXICODONE ) immediate release tablet 5 mg (has no administration in time range)    Or  oxyCODONE  (ROXICODONE ) 5 MG/5ML solution 5 mg (has no administration in time range)  fentaNYL  (SUBLIMAZE )  injection 25 mcg (has no administration in time range)  acetaminophen  (OFIRMEV ) IV 1,000 mg (has no administration in time range)  droperidol (INAPSINE) 2.5 MG/ML injection 0.625 mg (has no administration in time range)  0.9 %  sodium chloride  infusion (has no administration in time range)  traZODone  (DESYREL ) tablet 25 mg (has no administration in time range)  magnesium hydroxide (MILK OF MAGNESIA) suspension 30 mL (has no administration in time range)  ondansetron  (ZOFRAN ) tablet 4 mg (has no administration in time range)    Or  ondansetron  (ZOFRAN ) injection 4 mg (has no administration in time range)  oxyCODONE  (Oxy IR/ROXICODONE ) immediate release tablet 5 mg (has no administration in time range)  amLODipine  (NORVASC ) tablet 10 mg (has no administration in time range)  hydrALAZINE  (APRESOLINE ) tablet 50 mg (has no administration in time range)  labetalol  (NORMODYNE ) tablet 100 mg (has no administration in time range)  losartan  (COZAAR ) tablet 100 mg (has no administration in time range)  rosuvastatin  (CRESTOR ) tablet 20 mg (has no administration in time range)  citalopram  (CELEXA ) tablet 20 mg (has no administration in time range)  docusate sodium  (COLACE) capsule 100 mg (has no administration in time range)  meclizine  (ANTIVERT ) tablet 12.5 mg (has no administration in time range)  polyethylene glycol (MIRALAX  / GLYCOLAX ) packet 17 g (has no administration in time range)  finasteride  (PROSCAR ) tablet 5 mg (has no administration in time range)  fesoterodine (TOVIAZ) tablet 4 mg (has no administration in time range)  vitamin B-12 (CYANOCOBALAMIN ) tablet 100 mcg (has no administration in time range)  gabapentin  (NEURONTIN ) capsule 100 mg (has no administration in time range)  ascorbic acid (VITAMIN C) tablet 500 mg (has no administration in time range)  cholecalciferol (VITAMIN D3) 25 MCG (1000 UNIT) tablet 1,000 Units (has no administration in time range)  HYDROmorphone (DILAUDID)  injection 1 mg (1 mg Intravenous Given 07/20/24 1912)  HYDROmorphone (DILAUDID) injection 1 mg (1 mg Intravenous Given 07/20/24 2026)  propofol  (DIPRIVAN ) 10 mg/mL bolus/IV push (42.5 mg Intravenous Given 07/20/24 2039)     IMPRESSION / MDM / ASSESSMENT AND PLAN / ED COURSE  I reviewed the triage vital signs and the nursing notes.  78 year old male with PMH as noted above presents with right hip pain and inability to bear weight after mechanical fall.  Differential diagnosis includes, but is not limited to, dislocation, fracture, contusion, muscle spasm.  We will obtain x-rays for further evaluation.  Patient's presentation is most consistent with acute presentation with potential threat to life or bodily function.  The patient is on the cardiac monitor to evaluate for evidence of arrhythmia and/or significant heart rate changes.  ----------------------------------------- 9:35 PM on 07/20/2024 -----------------------------------------  X-rays confirmed a right posterior hip dislocation.  I attempted reduction under deep sedation with propofol .  The patient tolerated the procedure well, however despite adequate sedation, I was unable to reduce the hip.  I consulted and discussed the case with Dr. Cleotilde from orthopedics who agrees to evaluate the patient and take him to the OR for reduction under anesthesia.   FINAL CLINICAL IMPRESSION(S) / ED DIAGNOSES   Final diagnoses:  Dislocation of right hip, initial encounter [facility])     Rx / DC Orders   ED Discharge Orders     None        Note:  This document was prepared using Dragon voice recognition software and may include unintentional dictation errors.     Jacolyn Pae, MD 07/20/24 2310

## 2024-07-20 NOTE — Op Note (Signed)
 ORDATE@  10:37 PM  PATIENT:  [name]    PRE-OPERATIVE DIAGNOSIS: Right total hip dislocation posterior  POST-OPERATIVE DIAGNOSIS:  Same  PROCEDURE:  CLOSED REDUCTION, right total HIP  SURGEON:  Kayla FORBES Pinal, MD   ANESTHESIA:   General  PREOPERATIVE INDICATIONS:  Joe P Morad Tal. is a  78 y.o. male with a diagnosis of right total hip dislocation who failed conservative measures and elected for surgical management.    The risks benefits and alternatives were discussed with the patient preoperatively including but not limited to the risks of infection, bleeding, nerve injury, cardiopulmonary complications, the need for revision surgery, among others, and the patient was willing to proceed.  OPERATIVE IMPLANTS: None  OPERATIVE FINDINGS: Dislocated total hip right  EBL: None  COMPLICATIONS:   None  OPERATIVE PROCEDURE: The patient underwent satisfactory general anesthesia in the supine position on the operating room table.  The dislocated hip was seen to be short and unstable.  Using manual traction directed distally and anteriorly the hip was reduced with a satisfactory pop.  Range of motion was stable and leg lengths were restored.  The fluoroscopy was used to show that the hip was indeed reduced.  A knee immobilizer was applied to prevent hip and knee flexion.  The patient was awakened and taken to recovery in good condition.  Kayla FORBES Pinal, MD

## 2024-07-20 NOTE — Assessment & Plan Note (Signed)
 Blood pressure currently within goal. - Continue with home medications and monitor

## 2024-07-20 NOTE — H&P (Signed)
THE PATIENT WAS SEEN PRIOR TO SURGERY TODAY.  HISTORY, ALLERGIES, HOME MEDICATIONS AND OPERATIVE PROCEDURE WERE REVIEWED. RISKS AND BENEFITS OF SURGERY DISCUSSED WITH PATIENT AGAIN.  NO CHANGES FROM INITIAL HISTORY AND PHYSICAL NOTED.    

## 2024-07-20 NOTE — Consult Note (Signed)
 ORTHOPAEDIC CONSULTATION  REQUESTING PHYSICIAN: No att. providers found  Chief Complaint: Right hip pain  HPI: Joe Patton. is a 78 y.o. male who complains of right hip pain.  The patient has bilateral total hip replacements.  The right hip was done in 2014 by Dr. Mardee.  He has had multiple dislocations in the last couple years.  He bent over to drain in the bathtub this afternoon and the hip dislocated posteriorly.  He was brought to the emergency room and attempts were made by the emergency room staff to reduce the dislocation but they were unable to do so.  I have been consulted to see if we can accomplish this in the operating room.  The patient and his son are aware of the situation.  If close reduction cannot be accomplished recommended having Dr. Mardee consult on him tomorrow.  I discussed this with the patient and his son who are in agreement with the plan.  Past Medical History:  Diagnosis Date   Actinic keratosis    Allergic rhinitis    Anxiety    Arthritis    Bilirubinuria    BPH (benign prostatic hyperplasia)    Degeneration, intervertebral disc, lumbosacral    Erectile dysfunction    Eustachian tube dysfunction    Fibromyalgia    GERD (gastroesophageal reflux disease)    OTC if needed Pepcid    Heart murmur    Hypertension    Osteoarthrosis    Sinusitis    Sleep apnea    uses CPAP, will bring mask   Viral warts    Past Surgical History:  Procedure Laterality Date   CATARACT EXTRACTION     right   CHOLECYSTECTOMY  2005   COLONOSCOPY  2014   cleared for 10 yrs- Dr Jinny   CYSTOSCOPY WITH LITHOLAPAXY N/A 02/15/2021   Procedure: CYSTOSCOPY WITH LITHOLAPAXY;  Surgeon: Twylla Glendia BROCKS, MD;  Location: [facility] ORS;  Service: Urology;  Laterality: N/A;   EYE SURGERY Right    GALLBLADDER SURGERY     HIP SURGERY     left   JOINT REPLACEMENT     right and left total hips   LEFT HEART CATH AND CORONARY ANGIOGRAPHY Left 11/14/2023   Procedure: LEFT HEART CATH AND  CORONARY ANGIOGRAPHY;  Surgeon: Florencio Cara BIRCH, MD;  Location: [facility] INVASIVE CV LAB;  Service: Cardiovascular;  Laterality: Left;   TONSILLECTOMY     TOTAL SHOULDER ARTHROPLASTY Left 09/03/2014   Procedure: TOTAL SHOULDER ARTHROPLASTY;  Surgeon: Eva Elsie Herring, MD;  Location: [facility];  Service: Orthopedics;  Laterality: Left;   Social History   Socioeconomic History   Marital status: Married    Spouse name: Not on file   Number of children: 1   Years of education: Not on file   Highest education level: Not on file  Occupational History   Occupation: retired  Tobacco Use   Smoking status: Former    Current packs/day: 0.00    Average packs/day: 1.5 packs/day for 20.0 years (30.0 ttl pk-yrs)    Types: Cigarettes    Start date: 10/03/1987    Quit date: 10/03/2007    Years since quitting: 16.8   Smokeless tobacco: Never   Tobacco comments:    Has not resumed smoking. No need to provide pt with smoking cessation materials  Vaping Use   Vaping status: Never Used  Substance and Sexual Activity   Alcohol use: No   Drug use: No   Sexual activity: Yes  Other Topics Concern  Not on file  Social History Narrative   Not on file   Social Drivers of Health   Financial Resource Strain: Low Risk  (06/24/2024)   Received from [facility]   Overall Financial Resource Strain (CARDIA)    Difficulty of Paying Living Expenses: Not hard at all  Food Insecurity: No Food Insecurity (06/24/2024)   Received from [facility]   Hunger Vital Sign    Within the past 12 months, you worried that your food would run out before you got the money to buy more.: Never true    Within the past 12 months, the food you bought just didn't last and you didn't have money to get more.: Never true  Transportation Needs: No Transportation Needs (06/24/2024)   Received from [facility] - Transportation    In the past 12 months, has lack of  transportation kept you from medical appointments or from getting medications?: No    Lack of Transportation (Non-Medical): No  Physical Activity: Insufficiently Active (02/15/2022)   Exercise Vital Sign    Days of Exercise per Week: 7 days    Minutes of Exercise per Session: 20 min  Stress: No Stress Concern Present (02/15/2022)   [employer] of Occupational Health - Occupational Stress Questionnaire    Feeling of Stress : Not at all  Social Connections: Socially Integrated (04/17/2024)   Social Connection and Isolation Panel    Frequency of Communication with Friends and Family: More than three times a week    Frequency of Social Gatherings with Friends and Family: More than three times a week    Attends Religious Services: More than 4 times per year    Active Member of [employer] or Organizations: Yes    Attends Engineer, structural: More than 4 times per year    Marital Status: Married   Family History  Problem Relation Age of Onset   Breast cancer Mother    Hypertension Father    Heart attack Father    Stroke Father    Allergies  Allergen Reactions   Tramadol  Other (See Comments)    Anger issues  Other Reaction(s): agitation  tramadol   Anger issues    tramadol    Latex Rash and Dermatitis   Prior to Admission medications   Medication Sig Start Date End Date Taking? Authorizing Provider  amLODipine  (NORVASC ) 10 MG tablet Take 10 mg by mouth daily. 04/02/23   [provider]  ascorbic acid (VITAMIN C) 500 MG tablet Take 500 mg by mouth daily.    [provider]  aspirin  EC 81 MG tablet Take 81 mg by mouth daily. 12/29/23 12/28/24  [provider]  cholecalciferol (VITAMIN D3) 25 MCG (1000 UNIT) tablet Take 1,000 Units by mouth daily.    [provider]  citalopram  (CELEXA ) 20 MG tablet Take 1 tablet (20 mg total) by mouth daily. 12/14/23   Joshua Cathryne BROCKS, MD  clopidogrel  (PLAVIX ) 75 MG tablet Take 75 mg by mouth daily. 12/29/23  12/28/24  [provider]  docusate sodium  (COLACE) 100 MG capsule Take 1 capsule (100 mg total) by mouth 2 (two) times daily. 04/20/24   Barbarann Nest, MD  finasteride  (PROSCAR ) 5 MG tablet Take 1 tablet (5 mg total) by mouth daily. 04/03/24   Stoioff, Glendia BROCKS, MD  gabapentin  (NEURONTIN ) 100 MG capsule Take 1 capsule (100 mg total) by mouth at bedtime. 04/18/24   Barbarann Nest, MD  hydrALAZINE  (APRESOLINE ) 50 MG  tablet Take 1 tablet (50 mg total) by mouth every 8 (eight) hours as needed (systolic blood pressure >160). 04/18/24   Barbarann Nest, MD  labetalol  (NORMODYNE ) 100 MG tablet Take 100 mg by mouth 2 (two) times daily.    [provider]  losartan  (COZAAR ) 100 MG tablet Take 100 mg by mouth daily.    [provider]  meclizine  (ANTIVERT ) 25 MG tablet Take 1 tablet (25 mg total) by mouth 3 (three) times daily as needed for dizziness. 04/18/24   Barbarann Nest, MD  oxyCODONE  (OXY IR/ROXICODONE ) 5 MG immediate release tablet Take 1 tablet (5 mg total) by mouth every 4 (four) hours as needed for severe pain (pain score 7-10). 04/19/24   Barbarann Nest, MD  polyethylene glycol (MIRALAX  / GLYCOLAX ) 17 g packet Take 17 g by mouth daily. 04/21/24   Barbarann Nest, MD  rosuvastatin  (CRESTOR ) 20 MG tablet Take 20 mg by mouth daily. 12/28/23 12/27/24  [provider]  sildenafil  (REVATIO ) 20 MG tablet TAKE 2-5 TABLETS 1 HOUR PRIOR TO INTERCOURSE Patient taking differently: Take 20 mg by mouth daily as needed. TAKE 2-5 TABLETS 1 HOUR PRIOR TO INTERCOURSE 08/28/23   Stoioff, Glendia BROCKS, MD  solifenacin  (VESICARE ) 10 MG tablet Take 1 tablet (10 mg total) by mouth daily. 01/30/24   Stoioff, Glendia BROCKS, MD  vitamin B-12 (CYANOCOBALAMIN ) 100 MCG tablet Take 1 tablet (100 mcg total) by mouth daily. 04/18/24 04/18/25  Barbarann Nest, MD   DG Hip Unilat W or Wo Pelvis 2-3 Views Right Result Date: 07/20/2024 EXAM: 2 or 3 VIEW(S) XRAY OF THE PELVIS AND RIGHT HIP 07/20/2024 07:36:36 PM  COMPARISON: None available. CLINICAL HISTORY: R hip pain after fall. R hip pain after fall R hip pain after fall. R hip pain after fall FINDINGS: JOINTS: SI joints are symmetric. Right hip: Total right hip arthroplasty with interval posterior dislocation of the femoral component in relation to the acetabular component. No acute fracture of the right hip. Lucency noted of the proximal right femoral shaft likely a nutrient vessel with no definite fracture. Left hip: Total left arthroplasty partially visualized with no findings to suggest surgical hardware complication. Pelvis: No acute displaced fracture or diastasis of the bones of the pelvis. Sacrum: Unremarkable sacrum - limited evaluation due to overlapping osseous structures and overlying soft tissues. SOFT TISSUES: Vascular calcifications. IMPRESSION: 1. Posterior dislocation of the right total hip arthroplasty. 2. No acute fracture of the right hip. 3. No acute displaced fracture or diastasis of the pelvis. 4. Total left hip arthroplasty with no radiographic findings to suggest complication. Electronically signed by: Morgane Naveau MD 07/20/2024 07:47 PM EDT RP Workstation: [id number]    Positive ROS: All other systems have been reviewed and were otherwise negative with the exception of those mentioned in the HPI and as above.  Physical Exam: General: Alert, no acute distress Cardiovascular: No pedal edema Respiratory: No cyanosis, no use of accessory musculature GI: No organomegaly, abdomen is soft and non-tender Skin: No lesions in the area of chief complaint Neurologic: Sensation intact distally Psychiatric: Patient is competent for consent with normal mood and affect Lymphatic: No axillary or cervical lymphadenopathy  MUSCULOSKELETAL: Patient is alert and oriented.  He is lying quietly on the stretcher.  He right leg is shortened and internally rotated.  Neurovascular status is intact.  The wound is healed well-healed.  The left leg and lower  extremity is unremarkable.  He has a well-healed left total right to left total hip replacement scar.  Upper extremities are normal to exam.  The spine is nonpainful.  Assessment: Posterior dislocation right total hip  Plan: Close reduction right total hip replacement    Kayla FORBES Pinal, MD [phone]   07/20/2024 9:55 PM

## 2024-07-20 NOTE — Assessment & Plan Note (Addendum)
-   Will continue Celexa  and Remeron.

## 2024-07-20 NOTE — Anesthesia Procedure Notes (Signed)
 Procedure Name: MAC Date/Time: 07/20/2024 10:25 PM  Performed by: Landy Francena BIRCH, CRNAPre-anesthesia Checklist: Patient identified, Emergency Drugs available, Suction available and Patient being monitored Patient Re-evaluated:Patient Re-evaluated prior to induction Oxygen Delivery Method: Circle system utilized Preoxygenation: Pre-oxygenation with 100% oxygen Induction Type: IV induction Ventilation: Mask ventilation without difficulty and Oral airway inserted - appropriate to patient size Dental Injury: Teeth and Oropharynx as per pre-operative assessment

## 2024-07-20 NOTE — Assessment & Plan Note (Signed)
 This is likely secondary to stress demargination. - Will obtain a urinalysis.

## 2024-07-20 NOTE — Sedation Documentation (Signed)
 Per Primary RN Zachary paper consent obtained and signed by all parties.

## 2024-07-20 NOTE — Assessment & Plan Note (Signed)
 Will continue statin therapy

## 2024-07-20 NOTE — H&P (Signed)
 [place]   PATIENT NAME: Joe Patton    MR#:  [id number]  DATE OF BIRTH:  06/28/1946  DATE OF ADMISSION:  07/20/2024  PRIMARY CARE PHYSICIAN: Financial risk analyst, [facility]   Patient is coming from: Home  REQUESTING/REFERRING PHYSICIAN: Cleotilde Barrio, MD  CHIEF COMPLAINT:   Chief Complaint  Patient presents with   Hip Pain    PT to ER via EMS from home after a mechanical fall onto right hip - no loc - +plavix  - +rotation to right leg - PT states that he has had a dislocation of the same hip before    HISTORY OF PRESENT ILLNESS:  Joe Patton. is a 78 y.o. Caucasian male with medical history significant for anxiety, osteoarthritis, allergic rhinitis, fibromyalgia, GERD, OSA on CPAP, who presented to the emergency room with a [place] of right hip pain.  The patient was in his child and after bending he had subsequent dislocation of his right hip arthroplasty and subsequent fall without head injuries or presyncope or syncope.  He denied any nausea or vomiting or abdominal pain.  No fever or chills.  No chest pain or palpitations.  No cough or wheezing or dyspnea.  No dysuria, oliguria or hematuria or flank pain.  He denies any paresthesias or focal muscle weakness.  ED Course: When he came to the ER, BP was 170/93 and later 190/108 with otherwise normal vital signs.  Labs revealed borderline potassium of 3.5, BUN of 31 and creatinine 1.74 and CBC showed leukocytosis of 13.4 with neutrophilia. EKG as reviewed by me : EKG showed normal sinus rhythm with a rate of 77 with PACs and right bundle branch block. Imaging: Right hip x-ray showed posterior dislocation of the right total hip arthroplasty with no acute fracture and no fracture or diastasis of the pelvis.  It showed left total hip arthroplasty.  Repeat x-ray showed successful reduction in the right hip arthroplasty.  The patient was given 1 mg of IV Dilaudid twice and 10 mg of IV hydralazine  as well as oxycodone  5 mg  twice.  The patient was taken to the OR by Dr. Cleotilde after having unsuccessful attempt at reduction in the ED by EDP.  He underwent reduction of his dislocation on general anesthesia.  The patient will be admitted to a medical-surgical bed for further evaluation and management. PAST MEDICAL HISTORY:   Past Medical History:  Diagnosis Date   Actinic keratosis    Allergic rhinitis    Anxiety    Arthritis    Bilirubinuria    BPH (benign prostatic hyperplasia)    Degeneration, intervertebral disc, lumbosacral    Erectile dysfunction    Eustachian tube dysfunction    Fibromyalgia    GERD (gastroesophageal reflux disease)    OTC if needed Pepcid    Heart murmur    Hypertension    Osteoarthrosis    Sinusitis    Sleep apnea    uses CPAP, will bring mask   Viral warts     PAST SURGICAL HISTORY:   Past Surgical History:  Procedure Laterality Date   CATARACT EXTRACTION     right   CHOLECYSTECTOMY  2005   COLONOSCOPY  2014   cleared for 10 yrs- Dr Jinny   CYSTOSCOPY WITH LITHOLAPAXY N/A 02/15/2021   Procedure: CYSTOSCOPY WITH LITHOLAPAXY;  Surgeon: Twylla Glendia BROCKS, MD;  Location: [facility] ORS;  Service: Urology;  Laterality: N/A;   EYE SURGERY Right    GALLBLADDER SURGERY     HIP SURGERY  left   JOINT REPLACEMENT     right and left total hips   LEFT HEART CATH AND CORONARY ANGIOGRAPHY Left 11/14/2023   Procedure: LEFT HEART CATH AND CORONARY ANGIOGRAPHY;  Surgeon: Florencio Cara BIRCH, MD;  Location: [facility] INVASIVE CV LAB;  Service: Cardiovascular;  Laterality: Left;   TONSILLECTOMY     TOTAL SHOULDER ARTHROPLASTY Left 09/03/2014   Procedure: TOTAL SHOULDER ARTHROPLASTY;  Surgeon: Eva Elsie Herring, MD;  Location: [facility];  Service: Orthopedics;  Laterality: Left;    SOCIAL HISTORY:   Social History   Tobacco Use   Smoking status: Former    Current packs/day: 0.00    Average packs/day: 1.5 packs/day for 20.0 years (30.0 ttl pk-yrs)    Types: Cigarettes    Start date:  10/03/1987    Quit date: 10/03/2007    Years since quitting: 16.8   Smokeless tobacco: Never   Tobacco comments:    Has not resumed smoking. No need to provide pt with smoking cessation materials  Substance Use Topics   Alcohol use: No    FAMILY HISTORY:   Family History  Problem Relation Age of Onset   Breast cancer Mother    Hypertension Father    Heart attack Father    Stroke Father     DRUG ALLERGIES:   Allergies  Allergen Reactions   Tramadol  Other (See Comments)    Anger issues  Other Reaction(s): agitation  tramadol   Anger issues    tramadol    Latex Rash and Dermatitis    REVIEW OF SYSTEMS:   ROS As per history of present illness. All pertinent systems were reviewed above. Constitutional, HEENT, cardiovascular, respiratory, GI, GU, musculoskeletal, neuro, psychiatric, endocrine, integumentary and hematologic systems were reviewed and are otherwise negative/unremarkable except for positive findings mentioned above in the HPI.   MEDICATIONS AT HOME:   Prior to Admission medications   Medication Sig Start Date End Date Taking? Authorizing Provider  amLODipine  (NORVASC ) 10 MG tablet Take 10 mg by mouth daily. 04/02/23   [provider]  ascorbic acid (VITAMIN C) 500 MG tablet Take 500 mg by mouth daily.    [provider]  aspirin  EC 81 MG tablet Take 81 mg by mouth daily. 12/29/23 12/28/24  [provider]  cholecalciferol (VITAMIN D3) 25 MCG (1000 UNIT) tablet Take 1,000 Units by mouth daily.    [provider]  citalopram  (CELEXA ) 20 MG tablet Take 1 tablet (20 mg total) by mouth daily. 12/14/23   Joshua Cathryne BROCKS, MD  clopidogrel  (PLAVIX ) 75 MG tablet Take 75 mg by mouth daily. 12/29/23 12/28/24  [provider]  docusate sodium  (COLACE) 100 MG capsule Take 1 capsule (100 mg total) by mouth 2 (two) times daily. 04/20/24   Barbarann Nest, MD  finasteride  (PROSCAR ) 5 MG tablet Take 1 tablet (5 mg total) by mouth daily. 04/03/24    Stoioff, Glendia BROCKS, MD  gabapentin  (NEURONTIN ) 100 MG capsule Take 1 capsule (100 mg total) by mouth at bedtime. 04/18/24   Barbarann Nest, MD  hydrALAZINE  (APRESOLINE ) 50 MG tablet Take 1 tablet (50 mg total) by mouth every 8 (eight) hours as needed (systolic blood pressure >160). 04/18/24   Barbarann Nest, MD  labetalol  (NORMODYNE ) 100 MG tablet Take 100 mg by mouth 2 (two) times daily.    [provider]  losartan  (COZAAR ) 100 MG tablet Take 100 mg by mouth daily.    [provider]  meclizine  (ANTIVERT ) 25 MG tablet Take 1 tablet (25 mg total) by  mouth 3 (three) times daily as needed for dizziness. 04/18/24   Barbarann Nest, MD  oxyCODONE  (OXY IR/ROXICODONE ) 5 MG immediate release tablet Take 1 tablet (5 mg total) by mouth every 4 (four) hours as needed for severe pain (pain score 7-10). 04/19/24   Barbarann Nest, MD  polyethylene glycol (MIRALAX  / GLYCOLAX ) 17 g packet Take 17 g by mouth daily. 04/21/24   Barbarann Nest, MD  rosuvastatin  (CRESTOR ) 20 MG tablet Take 20 mg by mouth daily. 12/28/23 12/27/24  [provider]  sildenafil  (REVATIO ) 20 MG tablet TAKE 2-5 TABLETS 1 HOUR PRIOR TO INTERCOURSE Patient taking differently: Take 20 mg by mouth daily as needed. TAKE 2-5 TABLETS 1 HOUR PRIOR TO INTERCOURSE 08/28/23   Stoioff, Glendia BROCKS, MD  solifenacin  (VESICARE ) 10 MG tablet Take 1 tablet (10 mg total) by mouth daily. 01/30/24   Stoioff, Glendia BROCKS, MD  vitamin B-12 (CYANOCOBALAMIN ) 100 MCG tablet Take 1 tablet (100 mcg total) by mouth daily. 04/18/24 04/18/25  Barbarann Nest, MD      VITAL SIGNS:  Blood pressure (!) 168/85, pulse 82, temperature 98.5 F (36.9 C), resp. rate 16, height 6' 2 (1.88 m), weight 90.7 kg, SpO2 98%.  PHYSICAL EXAMINATION:  Physical Exam  GENERAL:  78 y.o.-year-old Caucasian male patient lying in the bed with no acute distress.  EYES: Pupils equal, round, reactive to light and accommodation. No scleral icterus. Extraocular muscles intact.   HEENT: Head atraumatic, normocephalic. Oropharynx and nasopharynx clear.  NECK:  Supple, no jugular venous distention. No thyroid enlargement, no tenderness.  LUNGS: Normal breath sounds bilaterally, no wheezing, rales,rhonchi or crepitation. No use of accessory muscles of respiration.  CARDIOVASCULAR: Regular rate and rhythm, S1, S2 normal. No murmurs, rubs, or gallops.  ABDOMEN: Soft, nondistended, nontender. Bowel sounds present. No organomegaly or mass.  EXTREMITIES: No pedal edema, cyanosis, or clubbing.  NEUROLOGIC: Cranial nerves II through XII are intact. Muscle strength 5/5 in all extremities. Sensation intact. Gait not checked.  PSYCHIATRIC: The patient is alert and oriented x 3.  Normal affect and good eye contact. SKIN: No obvious rash, lesion, or ulcer.   LABORATORY PANEL:   CBC Recent Labs  Lab 07/20/24 2141  WBC 13.4*  HGB 13.2  HCT 39.0  PLT 251   ------------------------------------------------------------------------------------------------------------------  Chemistries  Recent Labs  Lab 07/20/24 2141  NA 142  K 3.5  CL 102  CO2 28  GLUCOSE 112*  BUN 31*  CREATININE 1.74*  CALCIUM  9.2   ------------------------------------------------------------------------------------------------------------------  Cardiac Enzymes No results for input(s): TROPONINI in the last 168 hours. ------------------------------------------------------------------------------------------------------------------  RADIOLOGY:  DG HIP UNILAT WITH PELVIS 2-3 VIEWS RIGHT Result Date: 07/20/2024 EXAM: 1 VIEW XRAY OF THE PELVIS AND RIGHT HIP 07/20/2024 10:38:00 PM COMPARISON: None available. CLINICAL HISTORY: [id number] Surgery, elective [id number]. Dislocation of right hip, initial encounter ; Fluoro time: 3.8 seconds; 1.00 mGy Dislocation of right hip, initial encounter FINDINGS: JOINTS: No acute fracture. Successful reduction of the right hip arthroplasty dislocation. The left hip  demonstrates normal alignment. IMPRESSION: 1. Successful reduction of the right hip arthroplasty dislocation. Electronically signed by: Norman Gatlin MD 07/20/2024 11:16 PM EDT RP Workstation: [id number]   DG C-Arm 1-60 Min-No Report Result Date: 07/20/2024 Fluoroscopy was utilized by the requesting physician.  No radiographic interpretation.   DG Hip Unilat W or Wo Pelvis 2-3 Views Right Result Date: 07/20/2024 EXAM: 2 or 3 VIEW(S) XRAY OF THE PELVIS AND RIGHT HIP 07/20/2024 07:36:36 PM COMPARISON: None available. CLINICAL HISTORY: R hip pain after fall. R  hip pain after fall R hip pain after fall. R hip pain after fall FINDINGS: JOINTS: SI joints are symmetric. Right hip: Total right hip arthroplasty with interval posterior dislocation of the femoral component in relation to the acetabular component. No acute fracture of the right hip. Lucency noted of the proximal right femoral shaft likely a nutrient vessel with no definite fracture. Left hip: Total left arthroplasty partially visualized with no findings to suggest surgical hardware complication. Pelvis: No acute displaced fracture or diastasis of the bones of the pelvis. Sacrum: Unremarkable sacrum - limited evaluation due to overlapping osseous structures and overlying soft tissues. SOFT TISSUES: Vascular calcifications. IMPRESSION: 1. Posterior dislocation of the right total hip arthroplasty. 2. No acute fracture of the right hip. 3. No acute displaced fracture or diastasis of the pelvis. 4. Total left hip arthroplasty with no radiographic findings to suggest complication. Electronically signed by: Morgane Naveau MD 07/20/2024 07:47 PM EDT RP Workstation: [id number]      IMPRESSION AND PLAN:  Assessment and Plan: * Posterior dislocation of right hip (HCC) - Dislocation of the right total hip arthroplasty with subsequent mechanical fall without head injuries. - The patient will be admitted to a medical-surgical bed. - Pain management will be  provided. - Orthopedic consult was obtained by Dr. Cleotilde. - Attempted at reduction was attempted by the ED provider under deep sedation with propofol  was tolerated but it was unsuccessful. - The patient had reduction of his right THA under general anesthesia in the OR by Dr. Cleotilde.  AKI (acute kidney injury) - This is likely prerenal. - The patient will be placed on hydration with IV normal saline. - Will avoid nephrotoxins. - Will follow BMP.  Essential hypertension - This is currently uncontrolled. - Will continue antihypertensive therapy. - Will place him on as needed IV hydralazine  and labetalol .  Leukocytosis - This is likely secondary to stress demargination. - Will obtain a urinalysis.  Dyslipidemia - Will continue statin therapy.  Depression - Will continue Celexa  and Remeron.  Peripheral neuropathy - Will continue Neurontin .   DVT prophylaxis: SCDs. Advanced Care Planning:  Code Status: The patient is DNR and DNI.  This was discussed with him and his son. Family Communication:  The plan of care was discussed in details with the patient (and family). I answered all questions. The patient agreed to proceed with the above mentioned plan. Further management will depend upon hospital course. Disposition Plan: Back to previous home environment Consults called:: Orthopedic consult All the records are reviewed and case discussed with ED provider.  Status is: Inpatient   At the time of the admission, it appears that the appropriate admission status for this patient is inpatient.  This is judged to be reasonable and necessary in order to provide the required intensity of service to ensure the patient's safety given the presenting symptoms, physical exam findings and initial radiographic and laboratory data in the context of comorbid conditions.  The patient requires inpatient status due to high intensity of service, high risk of further deterioration and high frequency of  surveillance required.  I certify that at the time of admission, it is my clinical judgment that the patient will require inpatient hospital care extending more than 2 midnights.                            Dispo: The patient is from: Home  Anticipated d/c is to: Home              Patient currently is not medically stable to d/c.              Difficult to place patient: No  Joe Patton M.D on 07/20/2024 at 11:59 PM  [facility]   From 7 PM-7 AM, contact night-coverage [email or web address]  CC: Primary care physician; Collect[facility], [facility]

## 2024-07-20 NOTE — Progress Notes (Signed)
 On sb for conscious sedation procedure

## 2024-07-20 NOTE — Assessment & Plan Note (Addendum)
 This is likely prerenal. Improving creatinine with IV hydration, currently at 1.46. - Continue with IV fluid for another day Monitor renal function-

## 2024-07-20 NOTE — Anesthesia Preprocedure Evaluation (Signed)
 Anesthesia Evaluation  Patient identified by MRN, date of birth, ID band Patient awake    Reviewed: Allergy & Precautions, H&P , NPO status , Patient's Chart, lab work & pertinent test results, reviewed documented beta blocker date and time   Airway Mallampati: II  TM Distance: >3 FB Neck ROM: full    Dental  (+) Teeth Intact   Pulmonary sleep apnea , former smoker   Pulmonary exam normal        Cardiovascular Exercise Tolerance: Poor hypertension, On Medications + CAD  Normal cardiovascular exam+ Valvular Problems/Murmurs  Rate:Normal     Neuro/Psych  PSYCHIATRIC DISORDERS Anxiety Depression     Neuromuscular disease    GI/Hepatic Neg liver ROS,GERD  ,,  Endo/Other  negative endocrine ROS    Renal/GU Renal disease  negative genitourinary   Musculoskeletal   Abdominal   Peds  Hematology negative hematology ROS (+)   Anesthesia Other Findings   Reproductive/Obstetrics negative OB ROS                              Anesthesia Physical Anesthesia Plan  ASA: 3 and emergent  Anesthesia Plan: General LMA   Post-op Pain Management:    Induction:   PONV Risk Score and Plan: 3  Airway Management Planned:   Additional Equipment:   Intra-op Plan:   Post-operative Plan:   Informed Consent: I have reviewed the patients History and Physical, chart, labs and discussed the procedure including the risks, benefits and alternatives for the proposed anesthesia with the patient or authorized representative who has indicated his/her understanding and acceptance.       Plan Discussed with: CRNA  Anesthesia Plan Comments:         Anesthesia Quick Evaluation

## 2024-07-21 ENCOUNTER — Encounter: Payer: Self-pay | Admitting: Specialist

## 2024-07-21 DIAGNOSIS — F32A Depression, unspecified: Secondary | ICD-10-CM

## 2024-07-21 LAB — CBC
HCT: 39.6 % (ref 39.0–52.0)
Hemoglobin: 13.5 g/dL (ref 13.0–17.0)
MCH: 30.3 pg (ref 26.0–34.0)
MCHC: 34.1 g/dL (ref 30.0–36.0)
MCV: 89 fL (ref 80.0–100.0)
Platelets: 268 K/uL (ref 150–400)
RBC: 4.45 MIL/uL (ref 4.22–5.81)
RDW: 13 % (ref 11.5–15.5)
WBC: 12.9 K/uL — ABNORMAL HIGH (ref 4.0–10.5)
nRBC: 0 % (ref 0.0–0.2)

## 2024-07-21 LAB — BASIC METABOLIC PANEL WITH GFR
Anion gap: 20 — ABNORMAL HIGH (ref 5–15)
BUN: 23 mg/dL (ref 8–23)
CO2: 20 mmol/L — ABNORMAL LOW (ref 22–32)
Calcium: 9.3 mg/dL (ref 8.9–10.3)
Chloride: 101 mmol/L (ref 98–111)
Creatinine, Ser: 1.46 mg/dL — ABNORMAL HIGH (ref 0.61–1.24)
GFR, Estimated: 49 mL/min — ABNORMAL LOW (ref >60)
Glucose, Bld: 124 mg/dL — ABNORMAL HIGH (ref 70–99)
Potassium: 3.7 mmol/L (ref 3.5–5.1)
Sodium: 141 mmol/L (ref 135–145)

## 2024-07-21 LAB — URINALYSIS, COMPLETE (UACMP) WITH MICROSCOPIC
Bacteria, UA: NONE SEEN
Bilirubin Urine: NEGATIVE
Glucose, UA: NEGATIVE mg/dL
Hgb urine dipstick: NEGATIVE
Ketones, ur: NEGATIVE mg/dL
Leukocytes,Ua: NEGATIVE
Nitrite: NEGATIVE
Protein, ur: NEGATIVE mg/dL
Specific Gravity, Urine: 1.028 (ref 1.005–1.030)
pH: 5 (ref 5.0–8.0)

## 2024-07-21 MED ORDER — METOCLOPRAMIDE HCL 5 MG/ML IJ SOLN: 5.0000 mg | Freq: Three times a day (TID) | INTRAMUSCULAR | Status: DC | PRN

## 2024-07-21 MED ORDER — ALUM & MAG HYDROXIDE-SIMETH 200-200-20 MG/5ML PO SUSP: 30.0000 mL | ORAL | Status: DC | PRN

## 2024-07-21 MED ORDER — SPIRONOLACTONE 25 MG PO TABS
25.0000 mg | ORAL_TABLET | Freq: Every day | ORAL | Status: DC
  Administered 2024-07-22 – 2024-07-23 (×2): 25 mg via ORAL
  Filled 2024-07-21 (×2): qty 1

## 2024-07-21 MED ORDER — CLOPIDOGREL BISULFATE 75 MG PO TABS
75.0000 mg | ORAL_TABLET | Freq: Every day | ORAL | Status: DC
  Administered 2024-07-22 – 2024-07-23 (×2): 75 mg via ORAL
  Filled 2024-07-21 (×2): qty 1

## 2024-07-21 MED ORDER — MENTHOL 3 MG MT LOZG: 1.0000 | LOZENGE | OROMUCOSAL | Status: DC | PRN

## 2024-07-21 MED ORDER — METHOCARBAMOL 500 MG PO TABS
500.0000 mg | ORAL_TABLET | Freq: Four times a day (QID) | ORAL | Status: DC | PRN
  Administered 2024-07-21: 500 mg via ORAL
  Filled 2024-07-21: qty 1

## 2024-07-21 MED ORDER — FERROUS SULFATE 325 (65 FE) MG PO TABS
325.0000 mg | ORAL_TABLET | Freq: Every day | ORAL | Status: DC
  Administered 2024-07-21 – 2024-07-22 (×2): 325 mg via ORAL
  Filled 2024-07-21 (×3): qty 1

## 2024-07-21 MED ORDER — CEFAZOLIN SODIUM-DEXTROSE 2-4 GM/100ML-% IV SOLN
2.0000 g | Freq: Three times a day (TID) | INTRAVENOUS | Status: AC
  Administered 2024-07-21 (×3): 2 g via INTRAVENOUS
  Filled 2024-07-21 (×3): qty 100

## 2024-07-21 MED ORDER — MIRABEGRON ER 50 MG PO TB24
50.0000 mg | ORAL_TABLET | Freq: Every day | ORAL | Status: DC
  Administered 2024-07-22 – 2024-07-23 (×2): 50 mg via ORAL
  Filled 2024-07-21 (×2): qty 1

## 2024-07-21 MED ORDER — MORPHINE SULFATE (PF) 2 MG/ML IV SOLN
0.5000 mg | INTRAVENOUS | Status: DC | PRN
  Administered 2024-07-21 (×2): 1 mg via INTRAVENOUS
  Filled 2024-07-21 (×2): qty 1

## 2024-07-21 MED ORDER — BISACODYL 10 MG RE SUPP: 10.0000 mg | Freq: Every day | RECTAL | Status: DC | PRN

## 2024-07-21 MED ORDER — HYDROCODONE-ACETAMINOPHEN 5-325 MG PO TABS
1.0000 | ORAL_TABLET | ORAL | Status: DC | PRN
  Administered 2024-07-21 – 2024-07-22 (×5): 2 via ORAL
  Administered 2024-07-22 (×2): 1 via ORAL
  Administered 2024-07-22 – 2024-07-23 (×4): 2 via ORAL
  Filled 2024-07-21 (×3): qty 2
  Filled 2024-07-21 (×3): qty 1
  Filled 2024-07-21 (×3): qty 2
  Filled 2024-07-21: qty 1
  Filled 2024-07-21 (×2): qty 2

## 2024-07-21 MED ORDER — MIRTAZAPINE 15 MG PO TABS
15.0000 mg | ORAL_TABLET | Freq: Every day | ORAL | Status: DC
  Administered 2024-07-21 – 2024-07-22 (×2): 15 mg via ORAL
  Filled 2024-07-21 (×2): qty 1

## 2024-07-21 MED ORDER — METHOCARBAMOL 1000 MG/10ML IJ SOLN: 500.0000 mg | Freq: Four times a day (QID) | INTRAMUSCULAR | Status: DC | PRN

## 2024-07-21 MED ORDER — ENOXAPARIN SODIUM 40 MG/0.4ML IJ SOSY
40.0000 mg | PREFILLED_SYRINGE | INTRAMUSCULAR | Status: DC
  Administered 2024-07-21 – 2024-07-22 (×2): 40 mg via SUBCUTANEOUS
  Filled 2024-07-21 (×2): qty 0.4

## 2024-07-21 MED ORDER — SODIUM CHLORIDE 0.45 % IV SOLN: INTRAVENOUS | Status: DC

## 2024-07-21 MED ORDER — LACTATED RINGERS IV SOLN: INTRAVENOUS | Status: AC

## 2024-07-21 MED ORDER — METOCLOPRAMIDE HCL 5 MG PO TABS: 5.0000 mg | ORAL_TABLET | Freq: Three times a day (TID) | ORAL | Status: DC | PRN

## 2024-07-21 MED ORDER — ASPIRIN 325 MG PO TBEC: 325.0000 mg | DELAYED_RELEASE_TABLET | Freq: Every day | ORAL | Status: DC

## 2024-07-21 MED ORDER — FLEET ENEMA RE ENEM: 1.0000 | ENEMA | Freq: Once | RECTAL | Status: DC | PRN

## 2024-07-21 MED ORDER — PHENOL 1.4 % MT LIQD: 1.0000 | OROMUCOSAL | Status: DC | PRN

## 2024-07-21 MED ORDER — DOCUSATE SODIUM 100 MG PO CAPS
100.0000 mg | ORAL_CAPSULE | Freq: Two times a day (BID) | ORAL | Status: DC
  Administered 2024-07-21 – 2024-07-23 (×5): 100 mg via ORAL
  Filled 2024-07-21 (×5): qty 1

## 2024-07-21 MED ORDER — ACETAMINOPHEN 325 MG PO TABS: 325.0000 mg | ORAL_TABLET | Freq: Four times a day (QID) | ORAL | Status: DC | PRN

## 2024-07-21 MED ORDER — HYDROCHLOROTHIAZIDE 25 MG PO TABS
25.0000 mg | ORAL_TABLET | Freq: Every day | ORAL | Status: DC
  Administered 2024-07-22 – 2024-07-23 (×2): 25 mg via ORAL
  Filled 2024-07-21 (×2): qty 1

## 2024-07-21 MED ORDER — ZOLPIDEM TARTRATE 5 MG PO TABS: 5.0000 mg | ORAL_TABLET | Freq: Every evening | ORAL | Status: DC | PRN

## 2024-07-21 NOTE — Progress Notes (Signed)
  Progress Note   Patient: [facility] Joe Patton. FMW:[id number] DOB: June 11, 1946 DOA: 07/20/2024     1 DOS: the patient was seen and examined on 07/21/2024   Brief hospital course: Partly taken from H&P.  Brode P Kvon Mcilhenny. is a 78 y.o. 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  Failed attempt to reduce in ED so orthopedic surgery was consulted and patient was taken to the OR and reduction was done under anesthesia.  A knee immobilizer was applied to prevent hip and knee flexion.  Patient was also found to have mild AKI so admitted for IV fluid.  PT is recommending SNF. Patient will be weightbearing as tolerated and follow-up with orthopedic surgery as outpatient.  Assessment and Plan: * Posterior dislocation of right hip (HCC) Dislocation of the right total hip arthroplasty with subsequent mechanical fall without head injuries.  History of multiple prior dislocations and falls, s/p closed reduction under general anesthesia in OR by orthopedic surgery. PT and OT are recommending SNF - Continue with pain management and supportive care  AKI (acute kidney injury) This is likely prerenal. Improving creatinine with IV hydration, currently at 1.46. - Continue with IV fluid for another day Monitor renal function-  Leukocytosis  This is likely secondary to stress demargination. - Will obtain a urinalysis.  Essential hypertension Blood pressure currently within goal. - Continue with home medications and monitor  Dyslipidemia - Will continue statin therapy.  Depression - Will continue Celexa  and Remeron.  Peripheral neuropathy - Will continue Neurontin .   Subjective: Patient was still having some mild  right hip pain.  History of frequent falls and frequent dislocations.  Wife at bedside.  No other concerns  Physical Exam: Vitals:   07/21/24 0509 07/21/24 0853 07/21/24 1121 07/21/24 1121  BP: (!) 185/97 (!) 181/96 127/75 127/75  Pulse: 76 71 74 74  Resp: 18 18    Temp: 97.9 F (36.6 C) 98.1 F (36.7 C)    TempSrc:  Oral    SpO2: 97% 99% 98% 99%  Weight:      Height:       General.  Frail elderly man, in no acute distress. Pulmonary.  Lungs clear bilaterally, normal respiratory effort. CV.  Regular rate and rhythm, no JVD, rub or murmur. Abdomen.  Soft, nontender, nondistended, BS positive. CNS.  Alert and oriented .  No focal neurologic deficit. Extremities.  No edema, no cyanosis, pulses intact and symmetrical. Psychiatry.  Judgment and insight appears normal.   Data Reviewed: Prior data reviewed  Family Communication: Discussed with wife at bedside  Disposition: Status is: Inpatient Remains inpatient appropriate because: Severity of illness  Planned Discharge Destination: Skilled nursing facility  DVT prophylaxis.  Lovenox  Time spent: 45 minutes  This record has been created using Conservation officer, historic buildings. Errors have been sought and corrected,but may not always be located. Such creation errors do not reflect on the standard of care.   Author: Amaryllis Dare, MD 07/21/2024 2:33 PM  For on call review [email or web address].

## 2024-07-21 NOTE — Hospital Course (Addendum)
 Partly taken from H&P.  Joe Patton. is a 78 y.o. 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  Failed attempt to reduce in ED so orthopedic surgery was consulted and patient was taken to the OR and reduction was done under anesthesia.  A knee immobilizer was applied to prevent hip and knee flexion.  Patient was also found to have mild AKI so admitted for IV fluid.  PT is recommending SNF. Patient will be weightbearing as tolerated and follow-up with orthopedic surgery as outpatient.  10/21: Remained medically stable, improving renal function.  Awaiting SNF placement

## 2024-07-21 NOTE — Progress Notes (Signed)
 Subjective: 1 Day Post-Op Procedure(s) (LRB): CLOSED REDUCTION, HIP (Right) Patient is alert awake and comfortable.  His wife is at the bedside.  He is at PT once today. His wife and son are very concerned since he has fallen several times recently.  They are they are not sure that it is safe at home for him.  Patient reports pain as mild.  Objective:   VITALS:   Vitals:   07/21/24 1121 07/21/24 1121  BP: 127/75 127/75  Pulse: 74 74  Resp:    Temp:    SpO2: 98% 99%    Neurologically intact Dorsiflexion/Plantar flexion intact  LABS Recent Labs    07/20/24 2141 07/21/24 1014  HGB 13.2 13.5  HCT 39.0 39.6  WBC 13.4* 12.9*  PLT 251 268    Recent Labs    07/20/24 2141 07/21/24 1014  NA 142 141  K 3.5 3.7  BUN 31* 23  CREATININE 1.74* 1.46*  GLUCOSE 112* 124*    Recent Labs    07/20/24 2141  INR 1.0     Assessment/Plan: 1 Day Post-Op Procedure(s) (LRB): CLOSED REDUCTION, HIP (Right)   Advance diet Up with therapy Patient may weight-bear as tolerated with a walker and knee immobilizer. He should follow-up with Dr. Hooten in 5 to 7 days following discharge.

## 2024-07-21 NOTE — Evaluation (Signed)
 Occupational Therapy Evaluation Patient Details Name: Joe Patton. MRN: [id number] DOB: 1945-10-16 Today's Date: 07/21/2024   History of Present Illness   Pt is a 78 y/o M admitted on 07/20/24 after presenting with c/o a fall. Pt found to have R posterior hip dislocation. Pt is s/p closed reduction of R hip on 07/20/24. PMH: anxiety, OA, allergic rhinitis, fibromyalgia, GERD, OSA on CPAP, HTN, heart murmur     Clinical Impressions Patient presenting with decreased Ind in self care,balance, functional mobility/transfers, endurance, and safety awareness. Patient reports living at home with wife and being Ind at baseline. Patient currently functioning at min A for bed mobility and needing min A to stand from EOB. Pt taking several steps with RW to sink to stand for grooming tasks. Once UEs are unsupported pt needing min- mod A for balance secondary to posterior bias. Pt then returns to bed and stands x 3 more reps with RW with min A and min cuing for hand placement and technique. Min guard for sit >supine. Call bell and all needed items within reach. Patient will benefit from acute OT to increase overall independence in the areas of ADLs, functional mobility, and safety awareness in order to safely discharge.     If plan is discharge home, recommend the following:   A lot of help with walking and/or transfers;A lot of help with bathing/dressing/bathroom;Assistance with cooking/housework;Assistance with feeding;Help with stairs or ramp for entrance;Assist for transportation     Functional Status Assessment   Patient has had a recent decline in their functional status and demonstrates the ability to make significant improvements in function in a reasonable and predictable amount of time.     Equipment Recommendations   BSC/3in1      Precautions/Restrictions   Precautions Precautions: Fall Required Braces or Orthoses: Knee Immobilizer - Right Knee Immobilizer - Right: On at  all times Restrictions Weight Bearing Restrictions Per Provider Order: Yes RLE Weight Bearing Per Provider Order: Weight bearing as tolerated     Mobility Bed Mobility Overal bed mobility: Needs Assistance Bed Mobility: Supine to Sit     Supine to sit: Supervision, HOB elevated, Used rails          Transfers Overall transfer level: Needs assistance Equipment used: Rolling walker (2 wheels) Transfers: Sit to/from Stand, Bed to chair/wheelchair/BSC Sit to Stand: Via lift equipment, From elevated surface, Min assist     Step pivot transfers: Min assist            Balance Overall balance assessment: Needs assistance, History of Falls Sitting-balance support: Feet supported Sitting balance-Leahy Scale: Fair     Standing balance support: During functional activity, Bilateral upper extremity supported, Reliant on assistive device for balance Standing balance-Leahy Scale: Poor                             ADL either performed or assessed with clinical judgement   ADL Overall ADL's : Needs assistance/impaired     Grooming: Wash/dry face;Wash/dry hands;Oral care;Standing;Moderate assistance Grooming Details (indicate cue type and reason): mod A posterior bias in standing                                     Vision Patient Visual Report: No change from baseline              Pertinent Vitals/Pain Pain Assessment Pain Assessment:  Faces Faces Pain Scale: Hurts little more Pain Location: RLE with movement Pain Descriptors / Indicators: Discomfort Pain Intervention(s): Limited activity within patient's tolerance, Monitored during session, Repositioned, Patient requesting pain meds-RN notified     Extremity/Trunk Assessment Upper Extremity Assessment Upper Extremity Assessment: Generalized weakness   Lower Extremity Assessment Lower Extremity Assessment: Generalized weakness RLE: Unable to fully assess due to immobilization        Communication Communication Communication: No apparent difficulties   Cognition Arousal: Alert Behavior During Therapy: WFL for tasks assessed/performed                                 Following commands: Impaired Following commands impaired: Follows one step commands with increased time     Cueing  General Comments   Cueing Techniques: Verbal cues;Tactile cues              Home Living Family/patient expects to be discharged to:: Private residence Living Arrangements: Spouse/significant other Available Help at Discharge: Family;Available 24 hours/day Type of Home: House Home Access: Stairs to enter [employer] of Steps: 3 Entrance Stairs-Rails: Right;Left;Can reach both Home Layout: Two level;Able to live on main level with bedroom/bathroom Alternate Level Stairs-Number of Steps: bedroom/bathroom is on main level   Bathroom Shower/Tub: Tub/shower unit   Bathroom Toilet: Handicapped height     Home Equipment: Grab bars - tub/shower;Rolling Environmental consultant (2 wheels);Cane - quad;Cane - single point;Tub bench          Prior Functioning/Environment Prior Level of Function : Independent/Modified Independent             Mobility Comments: Pt recently transitioned from using [facility] to rollator for mobility, notes 3 falls in the past 6 months. Family supervises stair negotiation. ADLs Comments: Pt bathes & dresses without assistance. Wife drives.    OT Problem List: Decreased strength;Decreased knowledge of use of DME or AE;Decreased activity tolerance;Impaired balance (sitting and/or standing);Decreased safety awareness   OT Treatment/Interventions: Self-care/ADL training;Therapeutic exercise;Patient/family education;Balance training;Neuromuscular education;Energy conservation;Therapeutic activities;DME and/or AE instruction      OT Goals(Current goals can be found in the care plan section)   Acute Rehab OT Goals Patient Stated Goal: to get  stronger and decrease pain OT Goal Formulation: With patient Time For Goal Achievement: 08/04/24 Potential to Achieve Goals: Fair ADL Goals Pt Will Perform Grooming: standing;with supervision Pt Will Perform Lower Body Dressing: sit to/from stand;with supervision Pt Will Transfer to Toilet: with supervision;ambulating Pt Will Perform Toileting - Clothing Manipulation and hygiene: with supervision;sit to/from stand   OT Frequency:  Min 2X/week       AM-PAC OT 6 Clicks Daily Activity     Outcome Measure Help from another person eating meals?: None Help from another person taking care of personal grooming?: A Little Help from another person toileting, which includes using toliet, bedpan, or urinal?: A Little Help from another person bathing (including washing, rinsing, drying)?: A Little Help from another person to put on and taking off regular upper body clothing?: A Little Help from another person to put on and taking off regular lower body clothing?: A Lot 6 Click Score: 18   End of Session Equipment Utilized During Treatment: Rolling walker (2 wheels) Nurse Communication: Mobility status  Activity Tolerance: Patient tolerated treatment well Patient left: in bed;with call bell/phone within reach;with bed alarm set  OT Visit Diagnosis: Unsteadiness on feet (R26.81);Repeated falls (R29.6);Muscle weakness (generalized) (M62.81)  Time: 8554-8487 OT Time Calculation (min): 27 min Charges:  OT General Charges $OT Visit: 1 Visit OT Evaluation $OT Eval Low Complexity: 1 Low OT Treatments $Self Care/Home Management : 8-22 mins Izetta Claude, MS, OTR/L , CBIS ascom [phone]  07/21/24, 5:10 PM

## 2024-07-21 NOTE — Plan of Care (Signed)

## 2024-07-21 NOTE — Evaluation (Addendum)
 Physical Therapy Evaluation Patient Details Name: Joe Patton. MRN: [id number] DOB: [birth year] Today's Date: 07/21/2024  History of Present Illness  Pt is a 78 y/o M admitted on 07/20/24 after presenting with c/o a fall. Pt found to have R posterior hip dislocation. Pt is s/p closed reduction of R hip on 07/20/24. PMH: anxiety, OA, allergic rhinitis, fibromyalgia, GERD, OSA on CPAP, HTN, heart murmur  Clinical Impression  Pt seen for PT evaluation with pt agreeable. Pt reports prior to admission he was ambulatory with rollator, notes 3 falls in the past 6 months. Pt lives with wife who drives, provides supervision when he negotiates stairs. On this date, pt already has R KI donned; educated pt on it's use. Pt is able to complete bed mobility with supervision, sit>stand with as little as min assist & short distance gait in room. Pt c/o feeling shaky with standing/gait, decreased balance. Recommend ongoing PT services to progress mobility & reduce fall risk & caregiver burden.        If plan is discharge home, recommend the following: A little help with walking and/or transfers;A little help with bathing/dressing/bathroom;Assistance with cooking/housework;Assist for transportation;Help with stairs or ramp for entrance   Can travel by private vehicle   No    Equipment Recommendations BSC/3in1  Recommendations for Other Services  Rehab consult;OT consult    Functional Status Assessment Patient has had a recent decline in their functional status and demonstrates the ability to make significant improvements in function in a reasonable and predictable amount of time.     Precautions / Restrictions Precautions Precautions: Fall Required Braces or Orthoses: Knee Immobilizer - Right Knee Immobilizer - Right: On at all times ((per secure chat with Dr. Cleotilde, ortho)) Restrictions Weight Bearing Restrictions Per Provider Order: Yes RLE Weight Bearing Per Provider Order: Weight bearing  as tolerated      Mobility  Bed Mobility Overal bed mobility: Needs Assistance Bed Mobility: Supine to Sit     Supine to sit: Supervision, HOB elevated, Used rails (exit R side of bed)          Transfers Overall transfer level: Needs assistance Equipment used: Rolling walker (2 wheels) Transfers: Sit to/from Stand, Bed to chair/wheelchair/BSC Sit to Stand: Via lift equipment, From elevated surface, Min assist   Step pivot transfers: Min assist (bed>recliner on R with RW, cuing re: sequencing, step back to recliner, reach back with BUE)       General transfer comment: Pt requries significantly elevated EOB for first sit>stand with cuing re: hand placement (1 UE pushing to stand, other UE on RW), pt then transfers sit>stand from low recliner with improved ability to power to standing with BUE pushing to stand from armrests.    Ambulation/Gait Ambulation/Gait assistance: Min assist Gait Distance (Feet): 18 Feet Assistive device: Rolling walker (2 wheels) Gait Pattern/deviations: Decreased step length - left, Decreased step length - right, Decreased stride length Gait velocity: decreased        Stairs            Wheelchair Mobility     Tilt Bed    Modified Rankin (Stroke Patients Only)       Balance Overall balance assessment: Needs assistance, History of Falls Sitting-balance support: Feet supported Sitting balance-Leahy Scale: Fair     Standing balance support: During functional activity, Bilateral upper extremity supported, Reliant on assistive device for balance Standing balance-Leahy Scale: Poor Standing balance comment: c/o feeling shakey in standing  Pertinent Vitals/Pain Pain Assessment Pain Assessment: Faces Faces Pain Scale: Hurts little more Pain Location: RLE with movement Pain Descriptors / Indicators: Discomfort Pain Intervention(s): Monitored during session, Limited activity within patient's  tolerance    Home Living Family/patient expects to be discharged to:: Private residence Living Arrangements: Spouse/significant other Available Help at Discharge: Family;Available 24 hours/day Type of Home: House Home Access: Stairs to enter Entrance Stairs-Rails: Right;Left;Can reach both Entrance Stairs-Number of Steps: 3 Alternate Level Stairs-Number of Steps: bedroom/bathroom is on main level Home Layout: Two level;Able to live on main level with bedroom/bathroom Home Equipment: Grab bars - tub/shower;Rolling Walker (2 wheels);Cane - quad;Cane - single point;Tub bench      Prior Function               Mobility Comments: Pt recently transitioned from using [facility] to rollator for mobility, notes 3 falls in the past 6 months. Family supervises stair negotiation. ADLs Comments: Pt bathes & dresses without assistance. Wife drives.     Extremity/Trunk Assessment   Upper Extremity Assessment Upper Extremity Assessment: Generalized weakness    Lower Extremity Assessment Lower Extremity Assessment: Generalized weakness;RLE deficits/detail RLE: Unable to fully assess due to immobilization       Communication   Communication Communication: No apparent difficulties    Cognition Arousal: Alert Behavior During Therapy: WFL for tasks assessed/performed   PT - Cognitive impairments: Awareness, Problem solving, Safety/Judgement                         Following commands: Impaired Following commands impaired: Follows one step commands with increased time     Cueing Cueing Techniques: Verbal cues     General Comments General comments (skin integrity, edema, etc.): Pt reports c/o dizziness upon sitting & again upon standing, notes pursed lip breathing reduce feelings of dizziness.    Exercises     Assessment/Plan    PT Assessment Patient needs continued PT services  PT Problem List Decreased strength;Pain;Decreased range of motion;Decreased activity  tolerance;Decreased safety awareness;Decreased balance;Decreased mobility;Decreased knowledge of precautions;Decreased knowledge of use of DME       PT Treatment Interventions DME instruction;Balance training;Modalities;Neuromuscular re-education;Gait training;Stair training;Functional mobility training;Patient/family education;Therapeutic activities;Therapeutic exercise;Manual techniques;Wheelchair mobility training    PT Goals (Current goals can be found in the Care Plan section)  Acute Rehab PT Goals Patient Stated Goal: get better PT Goal Formulation: With patient Time For Goal Achievement: 08/04/24 Potential to Achieve Goals: Good    Frequency 7X/week     Co-evaluation               AM-PAC PT 6 Clicks Mobility  Outcome Measure Help needed turning from your back to your side while in a flat bed without using bedrails?: A Little Help needed moving from lying on your back to sitting on the side of a flat bed without using bedrails?: A Little Help needed moving to and from a bed to a chair (including a wheelchair)?: A Little Help needed standing up from a chair using your arms (e.g., wheelchair or bedside chair)?: A Lot Help needed to walk in hospital room?: A Little Help needed climbing 3-5 steps with a railing? : Total 6 Click Score: 15    End of Session   Activity Tolerance: Patient tolerated treatment well;Patient limited by fatigue Patient left: in chair;with call bell/phone within reach;with nursing/sitter in room;with family/visitor present (phlebotomist in room) Nurse Communication: Mobility status PT Visit Diagnosis: History of falling (Z91.81);Unsteadiness on feet (R26.81);Other abnormalities  of gait and mobility (R26.89);Difficulty in walking, not elsewhere classified (R26.2);Muscle weakness (generalized) (M62.81)    Time: 9050-8987 PT Time Calculation (min) (ACUTE ONLY): 23 min   Charges:   PT Evaluation $PT Eval Moderate Complexity: 1 Mod   PT General  Charges $$ ACUTE PT VISIT: 1 Visit         Richerd Pinal, PT, DPT 07/21/24, 12:31 PM   Richerd CHRISTELLA Pinal 07/21/2024, 10:29 AM

## 2024-07-22 LAB — CBC
HCT: 38.2 % — ABNORMAL LOW (ref 39.0–52.0)
Hemoglobin: 12.6 g/dL — ABNORMAL LOW (ref 13.0–17.0)
MCH: 30.4 pg (ref 26.0–34.0)
MCHC: 33 g/dL (ref 30.0–36.0)
MCV: 92 fL (ref 80.0–100.0)
Platelets: 214 K/uL (ref 150–400)
RBC: 4.15 MIL/uL — ABNORMAL LOW (ref 4.22–5.81)
RDW: 13 % (ref 11.5–15.5)
WBC: 9.2 K/uL (ref 4.0–10.5)
nRBC: 0 % (ref 0.0–0.2)

## 2024-07-22 LAB — BASIC METABOLIC PANEL WITH GFR
Anion gap: 10 (ref 5–15)
BUN: 27 mg/dL — ABNORMAL HIGH (ref 8–23)
CO2: 26 mmol/L (ref 22–32)
Calcium: 9.1 mg/dL (ref 8.9–10.3)
Chloride: 104 mmol/L (ref 98–111)
Creatinine, Ser: 1.29 mg/dL — ABNORMAL HIGH (ref 0.61–1.24)
GFR, Estimated: 57 mL/min — ABNORMAL LOW (ref >60)
Glucose, Bld: 86 mg/dL (ref 70–99)
Potassium: 3.5 mmol/L (ref 3.5–5.1)
Sodium: 140 mmol/L (ref 135–145)

## 2024-07-22 NOTE — TOC Initial Note (Signed)
 Transition of Care (TOC) - Initial/Assessment Note    Patient Details  Name: Joe Patton. MRN: [id number] Date of Birth: 04/25/1946  Transition of Care [facility]) CM/SW Contact:    Alvaro Louder, LCSW Phone Number: 07/22/2024, 8:58 AM  Clinical Narrative:     Per Chart review patient from Home. PCP is from [facility] Faxed out information to SNF's in [place]. LCSWA will present Facilities to patient at the bedside.             TOC to follow for discharge       Patient Goals and CMS Choice            Expected Discharge Plan and Services                                              Prior Living Arrangements/Services                       Activities of Daily Living   ADL Screening (condition at time of admission) Independently performs ADLs?: Yes (appropriate for developmental age) Is the patient deaf or have difficulty hearing?: No Does the patient have difficulty seeing, even when wearing glasses/contacts?: No Does the patient have difficulty concentrating, remembering, or making decisions?: No  Permission Sought/Granted                  Emotional Assessment              Admission diagnosis:  Dislocation of right hip, initial encounter (HCC) [S73.004A] Posterior dislocation of right hip (HCC) [S73.014A] Patient Active Problem List   Diagnosis Date Noted   Posterior dislocation of right hip (HCC) 07/20/2024   Essential hypertension 07/20/2024   Dyslipidemia 07/20/2024   AKI (acute kidney injury) 07/20/2024   Leukocytosis 07/20/2024   Generalized weakness 04/17/2024   Dizziness 04/16/2024   CAD (coronary artery disease) 04/16/2024   Depression with anxiety 04/16/2024   Acute kidney injury superimposed on chronic kidney disease 04/16/2024   Overweight (BMI 25.0-29.9) 04/16/2024   Fall at home, initial encounter 04/16/2024   Myocardial injury 04/16/2024   Hypertensive urgency 04/16/2024   Hypokalemia  04/16/2024   Peripheral neuropathy 04/16/2024   Actinic keratoses 12/11/2018   Allergic rhinitis 12/11/2018   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   Basal cell papilloma 12/11/2018   Bilirubin in urine 12/11/2018   Chronic venous insufficiency 12/11/2018   Coxitis 12/11/2018   DDD (degenerative disc disease), lumbosacral 12/11/2018   Dermatitis, stasis 12/11/2018   Dysfunction of eustachian tube 12/11/2018   Flu vaccine need 12/11/2018   Heart murmur 12/11/2018   Hypertension 12/11/2018   Impingement syndrome of shoulder 12/11/2018   Insect bite, nonvenomous 12/11/2018   Need for vaccination 12/11/2018   Preop examination 12/11/2018   Sinus infection 12/11/2018   Recurrent major depressive disorder, in full remission 05/21/2018   Benign prostatic hyperplasia with lower urinary tract symptoms 12/05/2017   Erectile dysfunction 12/05/2017   Condyloma acuminatum 09/04/2017   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   Presence of artificial shoulder joint 09/03/2014   Osteoarthritis of both knees 07/07/2014   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   OSA (obstructive sleep apnea) 05/08/2014   Abnormal EKG 05/06/2014   Depression 05/06/2014   Obesity 05/06/2014   Urinary urgency 07/22/2012   PCP:  Financial risk analyst, [facility]:   [facility] [place] [place]  - [address] [place] [address] Phone: [phone] Fax: [phone]     Social Drivers of Health (SDOH) Social History: SDOH Screenings   Food Insecurity: No Food Insecurity (07/21/2024)  Housing: Low Risk  (07/21/2024)  Transportation Needs: No Transportation Needs (07/21/2024)  Utilities: Not At Risk (07/21/2024)  Alcohol Screen: Low Risk  (02/15/2022)  Depression (PHQ2-9): Low Risk  (06/25/2023)  Financial Resource Strain: Low Risk  (06/24/2024)   Received from [facility]  Physical Activity: Insufficiently Active (02/15/2022)  Social Connections: Socially  Integrated (07/21/2024)  Stress: No Stress Concern Present (02/15/2022)  Tobacco Use: Medium Risk (07/20/2024)   SDOH Interventions:     Readmission Risk Interventions     No data to display

## 2024-07-22 NOTE — Assessment & Plan Note (Signed)
 Dislocation of the right total hip arthroplasty with subsequent mechanical fall without head injuries.  History of multiple prior dislocations and falls, s/p closed reduction under general anesthesia in OR by orthopedic surgery. PT and OT are recommending SNF - Continue with pain management and supportive care

## 2024-07-22 NOTE — [place] FL2 (Signed)
 [place]  MEDICAID FL2 LEVEL OF CARE FORM     IDENTIFICATION  Patient Name: [name]. Birthdate: Mar 29, 1946 Sex: male Admission Date (Current Location): 07/20/2024  [facility] and [place] Number:  Chiropodist and Address:  [facility], [address] [place], [place] [address]      Provider Number: [id number]  Attending Physician Name and Address:  Caleen Qualia, MD  Relative Name and Phone Number:       Current Level of Care: Hospital Recommended Level of Care: Skilled Nursing Facility Prior Approval Number:    Date Approved/Denied:   PASRR Number: [id number]  Discharge Plan: Home    Current Diagnoses: Patient Active Problem List   Diagnosis Date Noted   Posterior dislocation of right hip (HCC) 07/20/2024   Essential hypertension 07/20/2024   Dyslipidemia 07/20/2024   AKI (acute kidney injury) 07/20/2024   Leukocytosis 07/20/2024   Generalized weakness 04/17/2024   Dizziness 04/16/2024   CAD (coronary artery disease) 04/16/2024   Depression with anxiety 04/16/2024   Acute kidney injury superimposed on chronic kidney disease 04/16/2024   Overweight (BMI 25.0-29.9) 04/16/2024   Fall at home, initial encounter 04/16/2024   Myocardial injury 04/16/2024   Hypertensive urgency 04/16/2024   Hypokalemia 04/16/2024   Peripheral neuropathy 04/16/2024   Actinic keratoses 12/11/2018   Allergic rhinitis 12/11/2018   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   Basal cell papilloma 12/11/2018   Bilirubin in urine 12/11/2018   Chronic venous insufficiency 12/11/2018   Coxitis 12/11/2018   DDD (degenerative disc disease), lumbosacral 12/11/2018   Dermatitis, stasis 12/11/2018   Dysfunction of eustachian tube 12/11/2018   Flu vaccine need 12/11/2018   Heart murmur 12/11/2018   Hypertension 12/11/2018   Impingement syndrome of shoulder 12/11/2018   Insect bite, nonvenomous 12/11/2018   Need for vaccination 12/11/2018   Preop  examination 12/11/2018   Sinus infection 12/11/2018   Recurrent major depressive disorder, in full remission 05/21/2018   Benign prostatic hyperplasia with lower urinary tract symptoms 12/05/2017   Erectile dysfunction 12/05/2017   Condyloma acuminatum 09/04/2017   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   Presence of artificial shoulder joint 09/03/2014   Osteoarthritis of both knees 07/07/2014   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   OSA (obstructive sleep apnea) 05/08/2014   Abnormal EKG 05/06/2014   Depression 05/06/2014   Obesity 05/06/2014   Urinary urgency 07/22/2012    Orientation RESPIRATION BLADDER Height & Weight     Self, Time, Situation, Place  Normal Continent Weight: 200 lb (90.7 kg) Height:  6' 2 (188 cm)  BEHAVIORAL SYMPTOMS/MOOD NEUROLOGICAL BOWEL NUTRITION STATUS      Continent Diet  AMBULATORY STATUS COMMUNICATION OF NEEDS Skin   Limited Assist Verbally Surgical wounds (Surgical Closed Incision Right Hip)                       Personal Care Assistance Level of Assistance  Dressing, Bathing, Feeding Bathing Assistance: Limited assistance Feeding assistance: Independent Dressing Assistance: Limited assistance     Functional Limitations Info  Sight, Hearing, Speech Sight Info: Impaired Hearing Info: Adequate Speech Info: Adequate    SPECIAL CARE FACTORS FREQUENCY  PT (By licensed PT), OT (By licensed OT)     PT Frequency: 5x/week OT Frequency: 5x/week            Contractures      Additional Factors Info  Code Status, Allergies Code Status Info: DNR Limited Allergies Info: Latex, Tramadol   Current Medications (07/22/2024):  This is the current hospital active medication list Current Facility-Administered Medications  Medication Dose Route Frequency Provider Last Rate Last Admin   acetaminophen  (TYLENOL ) tablet 325-650 mg  325-650 mg Oral Q6H PRN Cleotilde Barrio, MD       alum & mag hydroxide-simeth (MAALOX/MYLANTA)  200-200-20 MG/5ML suspension 30 mL  30 mL Oral Q4H PRN Cleotilde Barrio, MD       amLODipine  (NORVASC ) tablet 10 mg  10 mg Oral Daily Cleotilde Barrio, MD   10 mg at 07/21/24 0848   ascorbic acid (VITAMIN C) tablet 500 mg  500 mg Oral Daily Cleotilde Barrio, MD   500 mg at 07/21/24 0848   bisacodyl  (DULCOLAX) suppository 10 mg  10 mg Rectal Daily PRN Cleotilde Barrio, MD       cholecalciferol (VITAMIN D3) 25 MCG (1000 UNIT) tablet 1,000 Units  1,000 Units Oral Daily Cleotilde Barrio, MD   1,000 Units at 07/21/24 0848   citalopram  (CELEXA ) tablet 20 mg  20 mg Oral Daily Cleotilde Barrio, MD   20 mg at 07/21/24 0848   clopidogrel  (PLAVIX ) tablet 75 mg  75 mg Oral Daily Amin, Sumayya, MD       docusate sodium  (COLACE) capsule 100 mg  100 mg Oral BID Cleotilde Barrio, MD   100 mg at 07/21/24 2102   enoxaparin  (LOVENOX ) injection 40 mg  40 mg Subcutaneous Q24H Hunt, Madison H, RPH   40 mg at 07/21/24 2102   ferrous sulfate tablet 325 mg  325 mg Oral Q breakfast Cleotilde Barrio, MD   325 mg at 07/21/24 9152   finasteride  (PROSCAR ) tablet 5 mg  5 mg Oral Daily Cleotilde Barrio, MD   5 mg at 07/21/24 9152   gabapentin  (NEURONTIN ) capsule 100 mg  100 mg Oral QHS Miller, Howard, MD   100 mg at 07/21/24 2102   hydrALAZINE  (APRESOLINE ) injection 10 mg  10 mg Intravenous Q6H PRN Mansy, Jan A, MD   10 mg at 07/21/24 9089   hydrochlorothiazide (HYDRODIURIL) tablet 25 mg  25 mg Oral Daily Amin, Sumayya, MD       HYDROcodone-acetaminophen  (NORCO/VICODIN) 5-325 MG per tablet 1-2 tablet  1-2 tablet Oral Q4H PRN Cleotilde Barrio, MD   2 tablet at 07/22/24 0505   labetalol  (NORMODYNE ) injection 20 mg  20 mg Intravenous Q3H PRN Mansy, Madison LABOR, MD       lactated ringers  infusion   Intravenous Continuous Amin, Sumayya, MD 100 mL/hr at 07/22/24 0101 New Bag at 07/22/24 0101   losartan  (COZAAR ) tablet 100 mg  100 mg Oral Daily Cleotilde Barrio, MD   100 mg at 07/21/24 0848   meclizine  (ANTIVERT ) tablet 12.5 mg  12.5 mg Oral TID PRN Cleotilde Barrio, MD       menthol  (CEPACOL) lozenge 3 mg  1 lozenge Oral PRN Cleotilde Barrio, MD       Or   phenol (CHLORASEPTIC) mouth spray 1 spray  1 spray Mouth/Throat PRN Cleotilde Barrio, MD       methocarbamol (ROBAXIN) tablet 500 mg  500 mg Oral Q6H PRN Cleotilde Barrio, MD   500 mg at 07/21/24 2151   Or   methocarbamol (ROBAXIN) injection 500 mg  500 mg Intravenous Q6H PRN Cleotilde Barrio, MD       metoCLOPramide  (REGLAN ) tablet 5-10 mg  5-10 mg Oral Q8H PRN Cleotilde Barrio, MD       Or   metoCLOPramide  (REGLAN ) injection 5-10 mg  5-10 mg Intravenous Q8H PRN Cleotilde Barrio, MD  mirabegron  ER (MYRBETRIQ ) tablet 50 mg  50 mg Oral Daily Amin, Sumayya, MD       mirtazapine (REMERON) tablet 15 mg  15 mg Oral QHS Amin, Sumayya, MD   15 mg at 07/21/24 2102   morphine  (PF) 2 MG/ML injection 0.5-1 mg  0.5-1 mg Intravenous Q2H PRN Cleotilde Barrio, MD   1 mg at 07/21/24 9389   ondansetron  (ZOFRAN ) tablet 4 mg  4 mg Oral Q6H PRN Cleotilde Barrio, MD       Or   ondansetron  (ZOFRAN ) injection 4 mg  4 mg Intravenous Q6H PRN Cleotilde Barrio, MD       polyethylene glycol (MIRALAX  / GLYCOLAX ) packet 17 g  17 g Oral Daily Cleotilde Barrio, MD   17 g at 07/21/24 9150   sodium phosphate  (FLEET) enema 1 enema  1 enema Rectal Once PRN Cleotilde Barrio, MD       spironolactone (ALDACTONE) tablet 25 mg  25 mg Oral Daily Amin, Sumayya, MD       vitamin B-12 (CYANOCOBALAMIN ) tablet 100 mcg  100 mcg Oral Daily Cleotilde Barrio, MD   100 mcg at 07/21/24 1216   zolpidem (AMBIEN) tablet 5 mg  5 mg Oral QHS PRN Cleotilde Barrio, MD         Discharge Medications: Please see discharge summary for a list of discharge medications.  Relevant Imaging Results:  Relevant Lab Results:   Additional Information SSN: [id number]  Leata Dominy  Vicci, LCSW

## 2024-07-22 NOTE — Assessment & Plan Note (Signed)
 This is likely secondary to stress demargination.  Improved - UA was not consistent with UTI

## 2024-07-22 NOTE — Progress Notes (Signed)
 Physical Therapy Treatment Patient Details Name: Joe Patton. MRN: [id number] DOB: 1946-09-13 Today's Date: 07/22/2024   History of Present Illness Pt is a 78 y/o M admitted on 07/20/24 after presenting with c/o a fall. Pt found to have R posterior hip dislocation. Pt is s/p closed reduction of R hip on 07/20/24. PMH: anxiety, OA, allergic rhinitis, fibromyalgia, GERD, OSA on CPAP, HTN, heart murmur    PT Comments  Pt seen this pm for progressive mobility and gait training. ModA for sit<>stand transfers with cues for R LE placement. Pt completed slow cadence antalgic gait WBAT R LE x 73ft with heavy reliance on RW to off load R LE. 6/10 hip pain, nursing notified for pain meds. Will continue to progress acutely.    If plan is discharge home, recommend the following: A little help with walking and/or transfers;A little help with bathing/dressing/bathroom;Assistance with cooking/housework;Assist for transportation;Help with stairs or ramp for entrance   Can travel by private vehicle     No  Equipment Recommendations  None recommended by PT (TBD at next level of care)    Recommendations for Other Services       Precautions / Restrictions Precautions Precautions: Fall;Posterior Hip Precaution Booklet Issued: Yes (comment) Recall of Precautions/Restrictions: Intact Precaution/Restrictions Comments:  (Pt given HEP for review) Required Braces or Orthoses: Knee Immobilizer - Right Knee Immobilizer - Right: On at all times Restrictions Weight Bearing Restrictions Per Provider Order: Yes RLE Weight Bearing Per Provider Order: Weight bearing as tolerated     Mobility  Bed Mobility Overal bed mobility: Needs Assistance Bed Mobility: Sit to Supine     Supine to sit: Min assist Sit to supine: Min assist   General bed mobility comments: Assist for R LE to maintain proper positioning    Transfers Overall transfer level: Needs assistance Equipment used: Rolling walker (2  wheels) Transfers: Sit to/from Stand Sit to Stand: Mod assist, Min assist           General transfer comment:  (Pt is 6'2 requiring high surfaces)    Ambulation/Gait Ambulation/Gait assistance: Contact guard assist Gait Distance (Feet):  (75) Assistive device: Rolling walker (2 wheels) Gait Pattern/deviations: Decreased step length - left, Decreased step length - right, Decreased stride length, Trunk flexed, Antalgic Gait velocity: decreased     General Gait Details:  (Improved tolerance for gait distance this pm)   Stairs Stairs:  (Pt has several steps at home)           Wheelchair Mobility     Tilt Bed    Modified Rankin (Stroke Patients Only)       Balance Overall balance assessment: Needs assistance, History of Falls Sitting-balance support: Feet supported Sitting balance-Leahy Scale: Fair     Standing balance support: Bilateral upper extremity supported Standing balance-Leahy Scale: Fair                              Hotel manager: No apparent difficulties  Cognition Arousal: Alert Behavior During Therapy: WFL for tasks assessed/performed                             Following commands: Impaired Following commands impaired: Follows multi-step commands inconsistently    Cueing Cueing Techniques: Verbal cues, Tactile cues  Exercises      General Comments        Pertinent Vitals/Pain Pain Assessment Pain Assessment: 0-10 Pain  Score: 6  Pain Location: RLE with movement Pain Descriptors / Indicators: Discomfort Pain Intervention(s): Patient requesting pain meds-RN notified    Home Living                          Prior Function            PT Goals (current goals can now be found in the care plan section) Acute Rehab PT Goals Patient Stated Goal: get better Progress towards PT goals: Progressing toward goals    Frequency    7X/week      PT Plan       Co-evaluation              AM-PAC PT 6 Clicks Mobility   Outcome Measure  Help needed turning from your back to your side while in a flat bed without using bedrails?: A Lot Help needed moving from lying on your back to sitting on the side of a flat bed without using bedrails?: A Little Help needed moving to and from a bed to a chair (including a wheelchair)?: A Little Help needed standing up from a chair using your arms (e.g., wheelchair or bedside chair)?: A Lot Help needed to walk in Patton room?: A Little Help needed climbing 3-5 steps with a railing? : Total 6 Click Score: 14    End of Session Equipment Utilized During Treatment: Gait belt Activity Tolerance: Patient tolerated treatment well Patient left: in bed;with call bell/phone within reach;with bed alarm set Nurse Communication: Mobility status PT Visit Diagnosis: History of falling (Z91.81);Unsteadiness on feet (R26.81);Other abnormalities of gait and mobility (R26.89);Difficulty in walking, not elsewhere classified (R26.2);Muscle weakness (generalized) (M62.81)     Time: 8665-8641 PT Time Calculation (min) (ACUTE ONLY): 24 min  Charges:    $Gait Training: 8-22 mins $Therapeutic Activity: 8-22 mins PT General Charges $$ ACUTE PT VISIT: 1 Visit                    Joe Patton, PTA  Joe Patton 07/22/2024, 4:15 PM

## 2024-07-22 NOTE — Anesthesia Postprocedure Evaluation (Signed)
 Anesthesia Post Note  Patient: [name].  Procedure(s) Performed: CLOSED REDUCTION, HIP (Right: Hip)  Patient location during evaluation: PACU Anesthesia Type: General Level of consciousness: awake and alert Pain management: pain level controlled Vital Signs Assessment: post-procedure vital signs reviewed and stable Respiratory status: spontaneous breathing, nonlabored ventilation, respiratory function stable and patient connected to nasal cannula oxygen Cardiovascular status: blood pressure returned to baseline and stable Postop Assessment: no apparent nausea or vomiting Anesthetic complications: no   No notable events documented.   Last Vitals:  Vitals:   07/22/24 0556 07/22/24 0814  BP: 113/75 (!) 173/81  Pulse: (!) 52 (!) 57  Resp:  16  Temp:  36.6 C  SpO2:  100%    Last Pain:  Vitals:   07/22/24 0952  TempSrc:   PainSc: 3                  Joe Patton KANDICE Clause

## 2024-07-22 NOTE — Assessment & Plan Note (Signed)
 This is likely prerenal. Improving creatinine with IV hydration, currently at 1.46>>1.29. Monitor renal function- Avoid nephrotoxins

## 2024-07-22 NOTE — Progress Notes (Signed)
  Progress Note   Patient: Joe Patton. FMW:[id number] DOB: July 17, 1946 DOA: 07/20/2024     2 DOS: the patient was seen and examined on 07/22/2024   Brief hospital course: Partly taken from H&P.  Joe Patton. is a 78 y.o. 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  Failed attempt to reduce in ED so orthopedic surgery was consulted and patient was taken to the OR and reduction was done under anesthesia.  A knee immobilizer was applied to prevent hip and knee flexion.  Patient was also found to have mild AKI so admitted for IV fluid.  PT is recommending SNF. Patient will be weightbearing as tolerated and follow-up with orthopedic surgery as outpatient.  10/21: Remained medically stable, improving renal function.  Awaiting SNF placement  Assessment and Plan: * Posterior dislocation of right hip (HCC) Dislocation of the right total hip arthroplasty with subsequent mechanical fall without head injuries.  History of multiple prior dislocations and falls, s/p closed reduction under general anesthesia in OR by orthopedic surgery. PT and OT are recommending SNF - Continue with pain management and supportive care  AKI (acute kidney injury) This is likely prerenal. Improving creatinine with IV hydration, currently at 1.46>>1.29. Monitor renal function- Avoid nephrotoxins  Leukocytosis  This is likely secondary to stress demargination.  Improved - UA was not consistent with UTI  Essential hypertension Blood pressure currently within goal. - Continue with home medications and monitor  Dyslipidemia - Will continue statin therapy.  Depression - Will continue Celexa  and Remeron.  Peripheral  neuropathy - Will continue Neurontin .   Subjective: Patient was sitting in chair when seen today.  Still having pain with ambulation.  Physical Exam: Vitals:   07/21/24 1941 07/22/24 0459 07/22/24 0556 07/22/24 0814  BP: 116/65 (!) 161/91 113/75 (!) 173/81  Pulse: 64 (!) 57 (!) 52 (!) 57  Resp: 18 16  16   Temp: 98.6 F (37 C) 98.2 F (36.8 C)  97.8 F (36.6 C)  TempSrc:      SpO2: 98% 99%  100%  Weight:      Height:       General.  Frail elderly man, in no acute distress. Pulmonary.  Lungs clear bilaterally, normal respiratory effort. CV.  Regular rate and rhythm, no JVD, rub or murmur. Abdomen.  Soft, nontender, nondistended, BS positive. CNS.  Alert and oriented .  No focal neurologic deficit. Extremities.  No edema, no cyanosis, pulses intact and symmetrical. Psychiatry.  Judgment and insight appears normal.    Data Reviewed: Prior data reviewed  Family Communication: Discussed with wife at bedside  Disposition: Status is: Inpatient Remains inpatient appropriate because: Severity of illness  Planned Discharge Destination: Skilled nursing facility  DVT prophylaxis.  Lovenox  Time spent: 44 minutes  This record has been created using Conservation officer, historic buildings. Errors have been sought and corrected,but may not always be located. Such creation errors do not reflect on the standard of care.   Author: Amaryllis Dare, MD 07/22/2024 4:22 PM  For on call review [email or web address].

## 2024-07-22 NOTE — Progress Notes (Signed)
 Occupational Therapy Treatment Patient Details Name: [facility] Czajkowski. MRN: [id number] DOB: 1946-02-12 Today's Date: 07/22/2024   History of present illness Pt is a 78 y/o M admitted on 07/20/24 after presenting with c/o a fall. Pt found to have R posterior hip dislocation. Pt is s/p closed reduction of R hip on 07/20/24. PMH: anxiety, OA, allergic rhinitis, fibromyalgia, GERD, OSA on CPAP, HTN, heart murmur   OT comments  Mr Bley was seen for OT treatment on this date, overlapping with PT for safe mobility. Upon arrival to room pt in bed, agreeable to tx. Pt requires MAX A don/doff KI and B socks in sitting - cues to maintain posterior hip pcns. MOD A sit<>stand from elevated height. Poor recall of posterior hip pcns, reviewed with pt/family. Pt making good progress toward goals, will continue to follow POC. Discharge recommendation remains appropriate.        If plan is discharge home, recommend the following:  A lot of help with walking and/or transfers;A lot of help with bathing/dressing/bathroom;Assistance with cooking/housework;Assistance with feeding;Help with stairs or ramp for entrance;Assist for transportation   Equipment Recommendations  BSC/3in1    Recommendations for Other Services      Precautions / Restrictions Precautions Precautions: Fall;Posterior Hip Recall of Precautions/Restrictions: Impaired Required Braces or Orthoses: Knee Immobilizer - Right Knee Immobilizer - Right: On at all times Restrictions Weight Bearing Restrictions Per Provider Order: Yes RLE Weight Bearing Per Provider Order: Weight bearing as tolerated       Mobility Bed Mobility Overal bed mobility: Needs Assistance Bed Mobility: Supine to Sit     Supine to sit: Min assist          Transfers Overall transfer level: Needs assistance Equipment used: Rolling walker (2 wheels) Transfers: Sit to/from Stand Sit to Stand: Mod assist, From elevated surface                  Balance Overall balance assessment: Needs assistance, History of Falls Sitting-balance support: Feet supported Sitting balance-Leahy Scale: Fair     Standing balance support: Bilateral upper extremity supported Standing balance-Leahy Scale: Fair                             ADL either performed or assessed with clinical judgement   ADL Overall ADL's : Needs assistance/impaired                                       General ADL Comments: MAX A don/doff KI and B socks in sitting - cues to maintain posterior hip pcns     Communication Communication Communication: No apparent difficulties   Cognition Arousal: Alert Behavior During Therapy: WFL for tasks assessed/performed Cognition: No apparent impairments                               Following commands: Impaired Following commands impaired: Follows multi-step commands inconsistently      Cueing   Cueing Techniques: Verbal cues, Tactile cues             Pertinent Vitals/ Pain       Pain Assessment Pain Assessment: 0-10 Pain Score: 5  Pain Location: RLE with movement Pain Descriptors / Indicators: Discomfort Pain Intervention(s): Limited activity within patient's tolerance, Premedicated before session   Frequency  Min 2X/week  Progress Toward Goals  OT Goals(current goals can now be found in the care plan section)  Progress towards OT goals: Progressing toward goals  Acute Rehab OT Goals OT Goal Formulation: With patient Time For Goal Achievement: 08/04/24 Potential to Achieve Goals: Fair ADL Goals Pt Will Perform Grooming: standing;with supervision Pt Will Perform Lower Body Dressing: sit to/from stand;with supervision Pt Will Transfer to Toilet: with supervision;ambulating Pt Will Perform Toileting - Clothing Manipulation and hygiene: with supervision;sit to/from stand  Plan      Co-evaluation                 AM-PAC OT 6 Clicks Daily Activity      Outcome Measure   Help from another person eating meals?: None Help from another person taking care of personal grooming?: A Little Help from another person toileting, which includes using toliet, bedpan, or urinal?: A Little Help from another person bathing (including washing, rinsing, drying)?: A Lot Help from another person to put on and taking off regular upper body clothing?: A Little Help from another person to put on and taking off regular lower body clothing?: A Lot 6 Click Score: 17    End of Session Equipment Utilized During Treatment: Rolling walker (2 wheels)  OT Visit Diagnosis: Unsteadiness on feet (R26.81);Repeated falls (R29.6);Muscle weakness (generalized) (M62.81)   Activity Tolerance Patient tolerated treatment well   Patient Left in chair;with call bell/phone within reach;with family/visitor present   Nurse Communication          Time: 9041-8983 OT Time Calculation (min): 18 min  Charges: OT General Charges $OT Visit: 1 Visit OT Treatments $Self Care/Home Management : 8-22 mins  Elston Slot, M.S. OTR/L  07/22/24, 10:51 AM  ascom [phone]

## 2024-07-22 NOTE — Progress Notes (Signed)
 Physical Therapy Treatment Patient Details Name: Joe Patton Eliaz Fout. MRN: [id number] DOB: 01-Apr-1946 Today's Date: 07/22/2024   History of Present Illness Pt is a 78 y/o M admitted on 07/20/24 after presenting with c/o a fall. Pt found to have R posterior hip dislocation. Pt is s/p closed reduction of R hip on 07/20/24. PMH: anxiety, OA, allergic rhinitis, fibromyalgia, GERD, OSA on CPAP, HTN, heart murmur    PT Comments  Pt received in bed with KI in place. Educated pt on importance of safe mobility while maintaining R LE Posterior Hip Precautions. Will reissue HEP after lunch. Overlap session with OT this date to provide safe progressive mobility. Pt actually moving well requiring MinA for bed mobility with HOB raised and use of rail. MinA to stand from raised bed, pt is 6'2. Improved tolerance for gait training in room with heavy relaince on RW to offload R LE due to 5/10 pain. Pt positioned in raised seat recliner and left for remaining session with OT.    If plan is discharge home, recommend the following: A little help with walking and/or transfers;A little help with bathing/dressing/bathroom;Assistance with cooking/housework;Assist for transportation;Help with stairs or ramp for entrance   Can travel by private vehicle     No  Equipment Recommendations  None recommended by PT (TBD at next level of care, BSC if going home)    Recommendations for Other Services       Precautions / Restrictions Precautions Precautions: Fall;Posterior Hip Recall of Precautions/Restrictions: Impaired Precaution/Restrictions Comments:  (Required cues to recall precautions) Required Braces or Orthoses: Knee Immobilizer - Right Knee Immobilizer - Right: On at all times Restrictions Weight Bearing Restrictions Per Provider Order: Yes RLE Weight Bearing Per Provider Order: Weight bearing as tolerated     Mobility  Bed Mobility Overal bed mobility: Needs Assistance Bed Mobility: Supine to Sit      Supine to sit: Min assist     General bed mobility comments: Assist for R LE to maintain proper positioning    Transfers Overall transfer level: Needs assistance Equipment used: Rolling walker (2 wheels) Transfers: Sit to/from Stand Sit to Stand: Min assist, From elevated surface           General transfer comment:  (Pt is 6'2 requiring high surfaces)    Ambulation/Gait Ambulation/Gait assistance: Contact guard assist Gait Distance (Feet):  (15) Assistive device: Rolling walker (2 wheels) Gait Pattern/deviations: Decreased step length - left, Decreased step length - right, Decreased stride length, Trunk flexed, Antalgic Gait velocity: decreased     General Gait Details:  (Cues to maintain precautions and improve safety awareness)   Stairs Stairs:  (Pt has several steps at home)           Wheelchair Mobility     Tilt Bed    Modified Rankin (Stroke Patients Only)       Balance Overall balance assessment: Needs assistance, History of Falls Sitting-balance support: Feet supported Sitting balance-Leahy Scale: Fair     Standing balance support: Bilateral upper extremity supported, During functional activity, Reliant on assistive device for balance Standing balance-Leahy Scale: Fair                              Musician Communication: No apparent difficulties  Cognition Arousal: Alert Behavior During Therapy: WFL for tasks assessed/performed  Following commands: Impaired Following commands impaired: Follows one step commands with increased time    Cueing Cueing Techniques: Verbal cues, Tactile cues  Exercises      General Comments        Pertinent Vitals/Pain Pain Assessment Pain Assessment: 0-10 Pain Score: 5  Pain Location: RLE with movement Pain Descriptors / Indicators: Discomfort Pain Intervention(s): Monitored during session    Home Living                           Prior Function            PT Goals (current goals can now be found in the care plan section) Progress towards PT goals: Progressing toward goals    Frequency    7X/week      PT Plan      Co-evaluation              AM-PAC PT 6 Clicks Mobility   Outcome Measure  Help needed turning from your back to your side while in a flat bed without using bedrails?: A Lot Help needed moving from lying on your back to sitting on the side of a flat bed without using bedrails?: A Little Help needed moving to and from a bed to a chair (including a wheelchair)?: A Little Help needed standing up from a chair using your arms (e.g., wheelchair or bedside chair)?: A Lot Help needed to walk in [facility] room?: A Little Help needed climbing 3-5 steps with a railing? : Total 6 Click Score: 14    End of Session Equipment Utilized During Treatment: Gait belt Activity Tolerance: Patient tolerated treatment well Patient left: in chair;with call bell/phone within reach;Other (comment) (With OT) Nurse Communication: Mobility status PT Visit Diagnosis: History of falling (Z91.81);Unsteadiness on feet (R26.81);Other abnormalities of gait and mobility (R26.89);Difficulty in walking, not elsewhere classified (R26.2);Muscle weakness (generalized) (M62.81)     Time: 9042-8989 PT Time Calculation (min) (ACUTE ONLY): 13 min  Charges:    $Therapeutic Activity: 8-22 mins PT General Charges $$ ACUTE PT VISIT: 1 Visit                    Darice Bohr, PTA  Darice JAYSON Bohr 07/22/2024, 12:12 PM

## 2024-07-22 NOTE — Progress Notes (Signed)
 [facility] Room 138 [facility] Liaison Note  This patient is currently followed by [facility]).  [facility] will follow through discharge disposition  Please call with any [facility] questions or concerns.  [facility] Liaison [phone]

## 2024-07-22 NOTE — Plan of Care (Signed)
 Pt cooperative with medications. Minimal complaints of pain.

## 2024-07-23 DIAGNOSIS — E569 Vitamin deficiency, unspecified: Secondary | ICD-10-CM | POA: Diagnosis not present

## 2024-07-23 DIAGNOSIS — R944 Abnormal results of kidney function studies: Secondary | ICD-10-CM | POA: Diagnosis not present

## 2024-07-23 DIAGNOSIS — S79911A Unspecified injury of right hip, initial encounter: Secondary | ICD-10-CM | POA: Diagnosis present

## 2024-07-23 DIAGNOSIS — R41841 Cognitive communication deficit: Secondary | ICD-10-CM | POA: Diagnosis not present

## 2024-07-23 DIAGNOSIS — F321 Major depressive disorder, single episode, moderate: Secondary | ICD-10-CM | POA: Diagnosis not present

## 2024-07-23 DIAGNOSIS — Z471 Aftercare following joint replacement surgery: Secondary | ICD-10-CM | POA: Diagnosis not present

## 2024-07-23 DIAGNOSIS — G9340 Encephalopathy, unspecified: Secondary | ICD-10-CM | POA: Diagnosis not present

## 2024-07-23 DIAGNOSIS — K5900 Constipation, unspecified: Secondary | ICD-10-CM | POA: Diagnosis not present

## 2024-07-23 DIAGNOSIS — I2510 Atherosclerotic heart disease of native coronary artery without angina pectoris: Secondary | ICD-10-CM | POA: Diagnosis not present

## 2024-07-23 DIAGNOSIS — M6259 Muscle wasting and atrophy, not elsewhere classified, multiple sites: Secondary | ICD-10-CM | POA: Diagnosis not present

## 2024-07-23 DIAGNOSIS — M797 Fibromyalgia: Secondary | ICD-10-CM | POA: Diagnosis not present

## 2024-07-23 DIAGNOSIS — R945 Abnormal results of liver function studies: Secondary | ICD-10-CM | POA: Diagnosis not present

## 2024-07-23 DIAGNOSIS — N1831 Chronic kidney disease, stage 3a: Secondary | ICD-10-CM | POA: Diagnosis not present

## 2024-07-23 DIAGNOSIS — M7989 Other specified soft tissue disorders: Secondary | ICD-10-CM | POA: Diagnosis not present

## 2024-07-23 DIAGNOSIS — R42 Dizziness and giddiness: Secondary | ICD-10-CM | POA: Diagnosis not present

## 2024-07-23 DIAGNOSIS — Z96641 Presence of right artificial hip joint: Secondary | ICD-10-CM | POA: Diagnosis not present

## 2024-07-23 DIAGNOSIS — G629 Polyneuropathy, unspecified: Secondary | ICD-10-CM | POA: Diagnosis not present

## 2024-07-23 DIAGNOSIS — R52 Pain, unspecified: Secondary | ICD-10-CM | POA: Diagnosis not present

## 2024-07-23 DIAGNOSIS — Z741 Need for assistance with personal care: Secondary | ICD-10-CM | POA: Diagnosis not present

## 2024-07-23 DIAGNOSIS — W19XXXD Unspecified fall, subsequent encounter: Secondary | ICD-10-CM | POA: Diagnosis not present

## 2024-07-23 DIAGNOSIS — R509 Fever, unspecified: Secondary | ICD-10-CM | POA: Diagnosis not present

## 2024-07-23 DIAGNOSIS — G8929 Other chronic pain: Secondary | ICD-10-CM | POA: Diagnosis not present

## 2024-07-23 DIAGNOSIS — T84020A Dislocation of internal right hip prosthesis, initial encounter: Secondary | ICD-10-CM | POA: Diagnosis not present

## 2024-07-23 DIAGNOSIS — R2681 Unsteadiness on feet: Secondary | ICD-10-CM | POA: Diagnosis not present

## 2024-07-23 DIAGNOSIS — S73014D Posterior dislocation of right hip, subsequent encounter: Secondary | ICD-10-CM | POA: Diagnosis not present

## 2024-07-23 DIAGNOSIS — N401 Enlarged prostate with lower urinary tract symptoms: Secondary | ICD-10-CM | POA: Diagnosis not present

## 2024-07-23 DIAGNOSIS — I5030 Unspecified diastolic (congestive) heart failure: Secondary | ICD-10-CM | POA: Diagnosis not present

## 2024-07-23 DIAGNOSIS — N179 Acute kidney failure, unspecified: Secondary | ICD-10-CM | POA: Diagnosis not present

## 2024-07-23 DIAGNOSIS — S73014A Posterior dislocation of right hip, initial encounter: Secondary | ICD-10-CM | POA: Diagnosis not present

## 2024-07-23 DIAGNOSIS — X501XXA Overexertion from prolonged static or awkward postures, initial encounter: Secondary | ICD-10-CM | POA: Diagnosis not present

## 2024-07-23 DIAGNOSIS — I10 Essential (primary) hypertension: Secondary | ICD-10-CM | POA: Diagnosis not present

## 2024-07-23 DIAGNOSIS — M62838 Other muscle spasm: Secondary | ICD-10-CM | POA: Diagnosis not present

## 2024-07-23 DIAGNOSIS — Z736 Limitation of activities due to disability: Secondary | ICD-10-CM | POA: Diagnosis not present

## 2024-07-23 DIAGNOSIS — M25551 Pain in right hip: Secondary | ICD-10-CM | POA: Diagnosis not present

## 2024-07-23 DIAGNOSIS — Z7401 Bed confinement status: Secondary | ICD-10-CM | POA: Diagnosis not present

## 2024-07-23 DIAGNOSIS — N289 Disorder of kidney and ureter, unspecified: Secondary | ICD-10-CM | POA: Diagnosis not present

## 2024-07-23 DIAGNOSIS — M24451 Recurrent dislocation, right hip: Secondary | ICD-10-CM | POA: Diagnosis not present

## 2024-07-23 MED ORDER — ACETAMINOPHEN 325 MG PO TABS: 325.0000 mg | ORAL_TABLET | Freq: Four times a day (QID) | ORAL | Status: AC | PRN

## 2024-07-23 MED ORDER — HYDROCODONE-ACETAMINOPHEN 5-325 MG PO TABS: 1.0000 | ORAL_TABLET | Freq: Three times a day (TID) | ORAL | 0 refills | Status: AC | PRN

## 2024-07-23 MED ORDER — POLYETHYLENE GLYCOL 3350 17 G PO PACK: 17.0000 g | PACK | Freq: Every day | ORAL | Status: AC | PRN

## 2024-07-23 MED ORDER — DOCUSATE SODIUM 100 MG PO CAPS: 100.0000 mg | ORAL_CAPSULE | Freq: Every day | ORAL | Status: AC | PRN

## 2024-07-23 MED ORDER — SILDENAFIL CITRATE 20 MG PO TABS: 20.0000 mg | ORAL_TABLET | Freq: Every day | ORAL | Status: AC | PRN

## 2024-07-23 MED ORDER — TRAMADOL HCL 50 MG PO TABS: 50.0000 mg | ORAL_TABLET | Freq: Two times a day (BID) | ORAL | 0 refills | Status: DC | PRN

## 2024-07-23 NOTE — Discharge Summary (Addendum)
 Physician Discharge Summary   Patient: Joe Patton [facility]. MRN: [id number] DOB: July 06, 1946  Admit date:     07/20/2024  Discharge date: 07/23/24  Discharge Physician: AIDA CHO   PCP: [facility]   Recommendations at discharge:   Follow-up with Dr. Mardee, orthopedic surgeon, in 5 to 7 days Follow-up with physician at the nursing home within 3 days of discharge  Discharge Diagnoses: Principal Problem:   Posterior dislocation of right hip (HCC) Active Problems:   AKI (acute kidney injury)   Leukocytosis   Essential hypertension   Dyslipidemia   Depression   Peripheral neuropathy  Resolved Problems:   * No resolved hospital problems. [facility] Course:  Mr. Gurkaran Rahm. 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  He was found to have dislocated right hip.  Attempt to reduce the dislocated hip in the ED was unsuccessful.  Orthopedic surgeon was consulted and he underwent closed reduction of right total hip on 07/20/2024.  He was evaluated by PT and OT who recommended further rehabilitation at [facility]. His condition has improved and is deemed stable for discharge today.     Assessment and Plan:  Posterior dislocation of right hip: S/p closed reduction of right total hip on 07/20/2024.  Follow-up with Dr. Mardee, orthopedic surgeon in 5 to 7 days.   AKI: Improved.   Leukocytosis: Improved   Hypertension: Continue antihypertensives BP was 196/96 earlier this morning.  However after taking his antihypertensives, BP came down to 125/79. Continue amlodipine , HCTZ, losartan  and spironolactone as able. Patient said hydralazine  was recently discontinued because of episodes of hypotension. Monitor BP closely and adjust antihypertensives  accordingly.   Comorbidities include depression, peripheral neuropathy, dyslipidemia   His condition has improved and he is deemed stable for discharge to SNF today.      Pain control - [place]  Controlled Substance Reporting System database was reviewed. and patient was instructed, not to drive, operate heavy machinery, perform activities at heights, swimming or participation in water activities or provide baby-sitting services while on Pain, Sleep and Anxiety Medications; until their outpatient Physician has advised to do so again. Also recommended to not to take more than prescribed Pain, Sleep and Anxiety Medications.  Consultants: Orthopedic surgeon Procedures performed: Closed reduction of right total thigh Disposition: Skilled nursing facility Diet recommendation:  Cardiac diet DISCHARGE MEDICATION: Allergies as of 07/23/2024       Reactions   Latex Rash, Dermatitis   Tramadol  Other (See Comments)   Anger issues Other Reaction(s): agitation tramadol  Anger issues    tramadol         Medication List     STOP taking these medications    ascorbic acid 500 MG tablet Commonly known as: VITAMIN C   aspirin  EC 81 MG tablet   cholecalciferol 25 MCG (1000 UNIT) tablet Commonly known as: VITAMIN D3   hydrALAZINE  50 MG tablet Commonly known as: APRESOLINE    labetalol  100 MG tablet Commonly known as: NORMODYNE    oxyCODONE  5 MG immediate release tablet Commonly known as: Oxy IR/ROXICODONE    rosuvastatin  20 MG tablet Commonly known as: CRESTOR    solifenacin  10 MG tablet Commonly known as: VESICARE    traMADol  50 MG tablet Commonly known as: ULTRAM    vitamin B-12 100 MCG tablet Commonly known as: CYANOCOBALAMIN        TAKE these  medications    acetaminophen  325 MG tablet Commonly known as: TYLENOL  Take 1-2 tablets (325-650 mg total) by mouth every 6 (six) hours as needed for mild pain (pain score 1-3) (or temp > 100.5).   amLODipine  10 MG  tablet Commonly known as: NORVASC  Take 10 mg by mouth daily.   citalopram  20 MG tablet Commonly known as: CELEXA  Take 1 tablet (20 mg total) by mouth daily.   clopidogrel  75 MG tablet Commonly known as: PLAVIX  Take 75 mg by mouth daily.   docusate sodium  100 MG capsule Commonly known as: COLACE Take 1 capsule (100 mg total) by mouth daily as needed for mild constipation. What changed:  when to take this reasons to take this   finasteride  5 MG tablet Commonly known as: PROSCAR  Take 1 tablet (5 mg total) by mouth daily.   gabapentin  100 MG capsule Commonly known as: NEURONTIN  Take 1 capsule (100 mg total) by mouth at bedtime.   hydrochlorothiazide 25 MG tablet Commonly known as: HYDRODIURIL Take 25 mg by mouth daily.   HYDROcodone-acetaminophen  5-325 MG tablet Commonly known as: NORCO/VICODIN Take 1 tablet by mouth every 8 (eight) hours as needed for up to 3 days for moderate pain (pain score 4-6).   losartan  100 MG tablet Commonly known as: COZAAR  Take 100 mg by mouth daily.   meclizine  25 MG tablet Commonly known as: ANTIVERT  Take 1 tablet (25 mg total) by mouth 3 (three) times daily as needed for dizziness.   mirabegron  ER 50 MG Tb24 tablet Commonly known as: MYRBETRIQ  Take 50 mg by mouth daily.   mirtazapine 15 MG tablet Commonly known as: REMERON Take 15 mg by mouth at bedtime.   polyethylene glycol 17 g packet Commonly known as: MIRALAX  / GLYCOLAX  Take 17 g by mouth daily as needed. What changed:  when to take this reasons to take this   sildenafil  20 MG tablet Commonly known as: REVATIO  Take 1 tablet (20 mg total) by mouth daily as needed. TAKE 2-5 TABLETS 1 HOUR PRIOR TO INTERCOURSE What changed: See the new instructions.   spironolactone 25 MG tablet Commonly known as: ALDACTONE Take 25 mg by mouth daily.               Discharge Care Instructions  (From admission, onward)           Start     Ordered   07/23/24 0000  Discharge  wound care:       Comments: Follow-up with orthopedic surgeon.   07/23/24 1443            Contact information for follow-up providers     [facility] Follow up.   Contact information: [address] [place] [address] [phone]         Marchia Drivers, MD Follow up on 07/30/2024.   Specialty: Orthopedic Surgery Why: at 140 Contact information: [address] [place] [address] [phone]              Contact information for after-discharge care     Destination     [facility] Preferred SNF .   Service: Skilled Nursing Contact information: [address] [place]  [address] [phone]                    Discharge Exam: Fredricka Weights   07/20/24 1825  Weight: 90.7 kg   GEN: NAD SKIN: Warm and dry EYES: No pallor or icterus ENT: MMM CV: RRR PULM: CTA  B ABD: soft, ND, NT, +BS CNS: AAO x 3, non focal EXT: No edema or tenderness   Condition at discharge: good  The results of significant diagnostics from this hospitalization (including imaging, microbiology, ancillary and laboratory) are listed below for reference.   Imaging Studies: DG HIP UNILAT WITH PELVIS 2-3 VIEWS RIGHT Result Date: 07/20/2024 EXAM: 1 VIEW XRAY OF THE PELVIS AND RIGHT HIP 07/20/2024 10:38:00 PM COMPARISON: None available. CLINICAL HISTORY: [id number] Surgery, elective [id number]. Dislocation of right hip, initial encounter ; Fluoro time: 3.8 seconds; 1.00 mGy Dislocation of right hip, initial encounter FINDINGS: JOINTS: No acute fracture. Successful reduction of the right hip arthroplasty dislocation. The left hip demonstrates normal alignment. IMPRESSION: 1. Successful reduction of the right hip arthroplasty dislocation. Electronically signed by: Norman Gatlin MD 07/20/2024 11:16 PM EDT RP Workstation: [id number]   DG C-Arm 1-60 Min-No Report Result Date: 07/20/2024 Fluoroscopy was utilized by the requesting  physician.  No radiographic interpretation.   DG Hip Unilat W or Wo Pelvis 2-3 Views Right Result Date: 07/20/2024 EXAM: 2 or 3 VIEW(S) XRAY OF THE PELVIS AND RIGHT HIP 07/20/2024 07:36:36 PM COMPARISON: None available. CLINICAL HISTORY: R hip pain after fall. R hip pain after fall R hip pain after fall. R hip pain after fall FINDINGS: JOINTS: SI joints are symmetric. Right hip: Total right hip arthroplasty with interval posterior dislocation of the femoral component in relation to the acetabular component. No acute fracture of the right hip. Lucency noted of the proximal right femoral shaft likely a nutrient vessel with no definite fracture. Left hip: Total left arthroplasty partially visualized with no findings to suggest surgical hardware complication. Pelvis: No acute displaced fracture or diastasis of the bones of the pelvis. Sacrum: Unremarkable sacrum - limited evaluation due to overlapping osseous structures and overlying soft tissues. SOFT TISSUES: Vascular calcifications. IMPRESSION: 1. Posterior dislocation of the right total hip arthroplasty. 2. No acute fracture of the right hip. 3. No acute displaced fracture or diastasis of the pelvis. 4. Total left hip arthroplasty with no radiographic findings to suggest complication. Electronically signed by: Morgane Naveau MD 07/20/2024 07:47 PM EDT RP Workstation: [id number]    Microbiology: Results for orders placed or performed in visit on 03/06/24  CULTURE, URINE COMPREHENSIVE     Status: None   Collection Time: 03/06/24 11:42 AM   Specimen: Urine   UR  Result Value Ref Range Status   Urine Culture, Comprehensive Final report  Final   Organism ID, Bacteria Comment  Final    Comment: Mixed urogenital flora Less than 10,000 colonies/mL   Microscopic Examination     Status: Abnormal   Collection Time: 03/06/24 11:42 AM   Urine  Result Value Ref Range Status   WBC, UA 0-5 0 - 5 /hpf Final   RBC, Urine 3-10 (A) 0 - 2 /hpf Final   Epithelial  Cells (non renal) 0-10 0 - 10 /hpf Final   Mucus, UA Present (A) Not Estab. Final   Bacteria, UA Few None seen/Few Final    Labs: CBC: Recent Labs  Lab 07/20/24 2141 07/21/24 1014 07/22/24 0417  WBC 13.4* 12.9* 9.2  NEUTROABS 10.7*  --   --   HGB 13.2 13.5 12.6*  HCT 39.0 39.6 38.2*  MCV 90.5 89.0 92.0  PLT 251 268 214   Basic Metabolic Panel: Recent Labs  Lab 07/20/24 2141 07/21/24 1014 07/22/24 0417  NA 142 141 140  K 3.5 3.7 3.5  CL 102 101 104  CO2 28 20* 26  GLUCOSE 112* 124* 86  BUN 31* 23 27*  CREATININE 1.74* 1.46* 1.29*  CALCIUM  9.2 9.3 9.1   Liver Function Tests: No results for input(s): AST, ALT, ALKPHOS, BILITOT, PROT, ALBUMIN in the last 168 hours. CBG: No results for input(s): GLUCAP in the last 168 hours.  Discharge time spent: greater than 30 minutes.  Signed: AIDA CHO, MD [facility] 07/23/2024

## 2024-07-23 NOTE — Progress Notes (Signed)
 Physical Therapy Treatment Patient Details Name: Joe Patton. MRN: [id number] DOB: Mar 11, 1946 Today's Date: 07/23/2024   History of Present Illness Pt is a 78 y/o M admitted on 07/20/24 after presenting with c/o a fall. Pt found to have R posterior hip dislocation. Pt is s/p closed reduction of R hip on 07/20/24. PMH: anxiety, OA, allergic rhinitis, fibromyalgia, GERD, OSA on CPAP, HTN, heart murmur    PT Comments  Pt continues to progress well and remains motivated to return to baseline LOF.  He remains weak requiring assist for transfers. ModA to prevent posterior LOB this am upon initially standing from bed to RW. Pt additionally required MinA while ambulating with RW in hall to due to unsteadiness. Increased forward flexed posture with shuffling like gait as pt fatigues while ambulating. Cues given to increase safety awareness. Will continue to progress acutely. Initial recs for STR remain appropriate.    If plan is discharge home, recommend the following: A little help with walking and/or transfers;A little help with bathing/dressing/bathroom;Assistance with cooking/housework;Assist for transportation;Help with stairs or ramp for entrance   Can travel by private vehicle     No  Equipment Recommendations  None recommended by PT (TBD at next level of care)    Recommendations for Other Services       Precautions / Restrictions Precautions Precautions: Fall;Posterior Hip Precaution Booklet Issued: Yes (comment) Recall of Precautions/Restrictions: Intact Precaution/Restrictions Comments: Pt issued HEP and precautions with fair understanding, will continue to educate Required Braces or Orthoses: Knee Immobilizer - Right Knee Immobilizer - Right: On at all times Restrictions Weight Bearing Restrictions Per Provider Order: Yes RLE Weight Bearing Per Provider Order: Weight bearing as tolerated     Mobility  Bed Mobility Overal bed mobility: Needs Assistance       Supine  to sit: Min assist, HOB elevated, Used rails     General bed mobility comments: Assist for R LE to maintain proper positioning    Transfers Overall transfer level: Needs assistance Equipment used: Rolling walker (2 wheels) Transfers: Sit to/from Stand Sit to Stand: Mod assist, Min assist, From elevated surface           General transfer comment: High elevated surface for transfers and hip precautions. ModA to prevent posterior lean upon initial standing    Ambulation/Gait Ambulation/Gait assistance: Contact guard assist, Min assist Gait Distance (Feet): 75 Feet Assistive device: Rolling walker (2 wheels) Gait Pattern/deviations: Decreased step length - left, Decreased step length - right, Decreased stride length, Trunk flexed, Antalgic Gait velocity: decreased     General Gait Details:  (One occasion of slight LOB requiring MinA to correct. Pt fatigues quickly during gait with increased trunk flexion and decreased heel strike resulting in higher fall risk)   Stairs             Wheelchair Mobility     Tilt Bed    Modified Rankin (Stroke Patients Only)       Balance Overall balance assessment: Needs assistance, History of Falls Sitting-balance support: Feet supported Sitting balance-Leahy Scale: Fair     Standing balance support: Bilateral upper extremity supported, During functional activity, Reliant on assistive device for balance Standing balance-Leahy Scale: Fair Standing balance comment:  (ModA this date to prevent posterior LOB upon initial stance at [facility])                            Communication Communication Communication: No apparent difficulties  Cognition Arousal:  Alert Behavior During Therapy: [facility] for tasks assessed/performed                             Following commands: Intact Following commands impaired: Follows multi-step commands inconsistently    Cueing Cueing Techniques: Verbal cues, Tactile cues  Exercises  General Exercises - Lower Extremity Ankle Circles/Pumps: AROM, Both, 10 reps Long Arc Quad: AROM, Both, 10 reps    General Comments General comments (skin integrity, edema, etc.):  (Right KI donned throughout session. No dizziness during gait training)      Pertinent Vitals/Pain Pain Assessment Pain Assessment: 0-10 Pain Score: 6  Pain Location: RLE with movement Pain Descriptors / Indicators: Discomfort Pain Intervention(s): Premedicated before session    Home Living                          Prior Function            PT Goals (current goals can now be found in the care plan section) Progress towards PT goals: Progressing toward goals    Frequency    7X/week      PT Plan      Co-evaluation              AM-PAC PT 6 Clicks Mobility   Outcome Measure  Help needed turning from your back to your side while in a flat bed without using bedrails?: A Lot Help needed moving from lying on your back to sitting on the side of a flat bed without using bedrails?: A Little Help needed moving to and from a bed to a chair (including a wheelchair)?: A Little Help needed standing up from a chair using your arms (e.g., wheelchair or bedside chair)?: A Lot Help needed to walk in hospital room?: A Little Help needed climbing 3-5 steps with a railing? : Total 6 Click Score: 14    End of Session Equipment Utilized During Treatment: Gait belt Activity Tolerance: Patient tolerated treatment well Patient left: in chair;with call bell/phone within reach;with chair alarm set Nurse Communication: Mobility status PT Visit Diagnosis: History of falling (Z91.81);Unsteadiness on feet (R26.81);Other abnormalities of gait and mobility (R26.89);Difficulty in walking, not elsewhere classified (R26.2);Muscle weakness (generalized) (M62.81)     Time: 1026-1050 PT Time Calculation (min) (ACUTE ONLY): 24 min  Charges:    $Gait Training: 8-22 mins $Therapeutic Exercise: 8-22  mins PT General Charges $$ ACUTE PT VISIT: 1 Visit                    Joe Patton, PTA Joe Patton 07/23/2024, 1:01 PM

## 2024-07-23 NOTE — Care Management Important Message (Signed)
 Important Message  Patient Details  Name: Joe Patton. MRN: [id number] Date of Birth: Sep 06, 1946   Important Message Given:  Yes - Medicare IM     Joe Patton W, CMA 07/23/2024, 11:34 AM

## 2024-07-23 NOTE — Progress Notes (Signed)
 1522 Report given to Cierra Winley at [facility]. IV removed.

## 2024-07-23 NOTE — TOC Transition Note (Signed)
 Transition of Care [facility]) - Discharge Note   Patient Details  Name: [name]. MRN: [id number] Date of Birth: 25-Jun-1946  Transition of Care [facility]) CM/SW Contact:  Alvaro Louder, LCSW Phone Number: 07/23/2024, 3:38 PM   Clinical Narrative:   LCSWA received insurance approval for patient to admit to SNF [facility]. LCSWA confirmed with MD that patient is stable for discharge. LCSWA notified the patient and they are in agreement with discharge. LCSWA confirmed bed is available at SNF. Transport arranged with [facility] for next available.  Number to call report: [phone], RM: [phone]   TOC Signing off  Final next level of care: Skilled Nursing Facility Barriers to Discharge: No Barriers Identified   Patient Goals and CMS Choice            Discharge Placement              Patient chooses bed at: [facility] [place] Patient to be transferred to facility by: [facility] Name of family member notified: Self Patient and family notified of of transfer: 07/23/24  Discharge Plan and Services Additional resources added to the After Visit Summary for                                       Social Drivers of Health (SDOH) Interventions SDOH Screenings   Food Insecurity: No Food Insecurity (07/21/2024)  Housing: Low Risk  (07/21/2024)  Transportation Needs: No Transportation Needs (07/21/2024)  Utilities: Not At Risk (07/21/2024)  Alcohol Screen: Low Risk  (02/15/2022)  Depression (PHQ2-9): Low Risk  (06/25/2023)  Financial Resource Strain: Low Risk  (06/24/2024)   Received from [facility]  Physical Activity: Insufficiently Active (02/15/2022)  Social Connections: Socially Integrated (07/21/2024)  Stress: No Stress Concern Present (02/15/2022)  Tobacco Use: Medium Risk (07/20/2024)     Readmission Risk Interventions     No data to display

## 2024-07-24 DIAGNOSIS — F321 Major depressive disorder, single episode, moderate: Secondary | ICD-10-CM | POA: Diagnosis not present

## 2024-07-24 DIAGNOSIS — I10 Essential (primary) hypertension: Secondary | ICD-10-CM | POA: Diagnosis not present

## 2024-07-24 DIAGNOSIS — I2510 Atherosclerotic heart disease of native coronary artery without angina pectoris: Secondary | ICD-10-CM | POA: Diagnosis not present

## 2024-07-24 DIAGNOSIS — S73014D Posterior dislocation of right hip, subsequent encounter: Secondary | ICD-10-CM | POA: Diagnosis not present

## 2024-07-25 ENCOUNTER — Ambulatory Visit: Admission: RE | Admit: 2024-07-25 | Source: Ambulatory Visit

## 2024-07-25 ENCOUNTER — Inpatient Hospital Stay

## 2024-07-27 ENCOUNTER — Emergency Department

## 2024-07-27 ENCOUNTER — Emergency Department
Admission: EM | Admit: 2024-07-27 | Discharge: 2024-07-27 | Disposition: A | Discharge status: Home / Self Care | Attending: Emergency Medicine | Admitting: Emergency Medicine

## 2024-07-27 ENCOUNTER — Other Ambulatory Visit: Payer: Self-pay

## 2024-07-27 DIAGNOSIS — X501XXA Overexertion from prolonged static or awkward postures, initial encounter: Secondary | ICD-10-CM | POA: Diagnosis not present

## 2024-07-27 DIAGNOSIS — S79911A Unspecified injury of right hip, initial encounter: Secondary | ICD-10-CM | POA: Diagnosis present

## 2024-07-27 DIAGNOSIS — Z96641 Presence of right artificial hip joint: Secondary | ICD-10-CM | POA: Insufficient documentation

## 2024-07-27 DIAGNOSIS — T84020A Dislocation of internal right hip prosthesis, initial encounter: Secondary | ICD-10-CM | POA: Diagnosis not present

## 2024-07-27 DIAGNOSIS — Z471 Aftercare following joint replacement surgery: Secondary | ICD-10-CM | POA: Diagnosis not present

## 2024-07-27 DIAGNOSIS — S73014A Posterior dislocation of right hip, initial encounter: Secondary | ICD-10-CM | POA: Insufficient documentation

## 2024-07-27 DIAGNOSIS — M7989 Other specified soft tissue disorders: Secondary | ICD-10-CM | POA: Insufficient documentation

## 2024-07-27 LAB — CBC WITH DIFFERENTIAL/PLATELET
Abs Immature Granulocytes: 0.02 K/uL (ref 0.00–0.07)
Basophils Absolute: 0.1 K/uL (ref 0.0–0.1)
Basophils Relative: 1 %
Eosinophils Absolute: 0.6 K/uL — ABNORMAL HIGH (ref 0.0–0.5)
Eosinophils Relative: 8 %
HCT: 35.3 % — ABNORMAL LOW (ref 39.0–52.0)
Hemoglobin: 11.5 g/dL — ABNORMAL LOW (ref 13.0–17.0)
Immature Granulocytes: 0 %
Lymphocytes Relative: 26 %
Lymphs Abs: 2 K/uL (ref 0.7–4.0)
MCH: 30 pg (ref 26.0–34.0)
MCHC: 32.6 g/dL (ref 30.0–36.0)
MCV: 92.2 fL (ref 80.0–100.0)
Monocytes Absolute: 0.7 K/uL (ref 0.1–1.0)
Monocytes Relative: 9 %
Neutro Abs: 4.4 K/uL (ref 1.7–7.7)
Neutrophils Relative %: 56 %
Platelets: 212 K/uL (ref 150–400)
RBC: 3.83 MIL/uL — ABNORMAL LOW (ref 4.22–5.81)
RDW: 13.2 % (ref 11.5–15.5)
WBC: 7.8 K/uL (ref 4.0–10.5)
nRBC: 0 % (ref 0.0–0.2)

## 2024-07-27 LAB — BASIC METABOLIC PANEL WITH GFR
Anion gap: 13 (ref 5–15)
BUN: 29 mg/dL — ABNORMAL HIGH (ref 8–23)
CO2: 24 mmol/L (ref 22–32)
Calcium: 8.7 mg/dL — ABNORMAL LOW (ref 8.9–10.3)
Chloride: 102 mmol/L (ref 98–111)
Creatinine, Ser: 1.56 mg/dL — ABNORMAL HIGH (ref 0.61–1.24)
GFR, Estimated: 45 mL/min — ABNORMAL LOW (ref >60)
Glucose, Bld: 107 mg/dL — ABNORMAL HIGH (ref 70–99)
Potassium: 3.5 mmol/L (ref 3.5–5.1)
Sodium: 139 mmol/L (ref 135–145)

## 2024-07-27 MED ORDER — MORPHINE SULFATE (PF) 4 MG/ML IV SOLN
4.0000 mg | Freq: Once | INTRAVENOUS | Status: AC
  Administered 2024-07-27: 4 mg via INTRAVENOUS
  Filled 2024-07-27: qty 1

## 2024-07-27 MED ORDER — KETAMINE HCL 50 MG/5ML IJ SOSY
PREFILLED_SYRINGE | INTRAMUSCULAR | Status: AC
  Filled 2024-07-27: qty 5

## 2024-07-27 MED ORDER — SODIUM CHLORIDE 0.9 % IV BOLUS
1000.0000 mL | Freq: Once | INTRAVENOUS | Status: AC
  Administered 2024-07-27: 1000 mL via INTRAVENOUS

## 2024-07-27 MED ORDER — KETAMINE HCL 50 MG/5ML IJ SOSY
1.0000 mg/kg | PREFILLED_SYRINGE | Freq: Once | INTRAMUSCULAR | Status: AC
  Administered 2024-07-27: 91 mg via INTRAVENOUS
  Filled 2024-07-27: qty 10

## 2024-07-27 NOTE — ED Provider Notes (Signed)
 [facility] Provider Note    Event Date/Time   First MD Initiated Contact with Patient 07/27/24 1618     (approximate)   History   Hip Pain   HPI  Joe Patton. is a 78 y.o. male  who presents to the emergency department today because of concern for right hip pain and possible dislocation. The patient was seen in the emergency department one week ago for right prosthetic hip dislocation, per chart review did have to go to the OR for closed reduction after failed ER attempt. He says that today his wife was helping him put on a shoe when he felt it pop out. He was wearing a knee immobilizer at the time. He denies any recent illness or fevers.     Physical Exam   Triage Vital Signs: ED Triage Vitals  Encounter Vitals Group     BP 07/27/24 1623 105/85     Girls Systolic BP Percentile --      Girls Diastolic BP Percentile --      Boys Systolic BP Percentile --      Boys Diastolic BP Percentile --      Pulse Rate 07/27/24 1623 78     Resp 07/27/24 1623 18     Temp 07/27/24 1623 98 F (36.7 C)     Temp Source 07/27/24 1623 Oral     SpO2 07/27/24 1623 96 %     Weight 07/27/24 1620 199 lb 15.3 oz (90.7 kg)     Height 07/27/24 1620 6' 2 (1.88 m)     Head Circumference --      Pe[facility] Flow --      Pain Score 07/27/24 1619 9     Pain Loc --      Pain Education --      Exclude from Growth Chart --     Most recent vital signs: Vitals:   07/27/24 1623  BP: 105/85  Pulse: 78  Resp: 18  Temp: 98 F (36.7 C)  SpO2: 96%   General: Awake, alert, oriented. CV:  Good peripheral perfusion. Regular rate and rhythm. Resp:  Normal effort. Lungs clear. Abd:  No distention.  Other:  Tenderness to right hip.   ED Results / Procedures / Treatments   Labs (all labs ordered are listed, but only abnormal results are displayed) Labs Reviewed  CBC WITH DIFFERENTIAL/PLATELET - Abnormal; Notable for the following components:      Result Value   RBC 3.83  (*)    Hemoglobin 11.5 (*)    HCT 35.3 (*)    Eosinophils Absolute 0.6 (*)    All other components within normal limits  BASIC METABOLIC PANEL WITH GFR     EKG  None   RADIOLOGY I independently interpreted and visualized the right hip x-ray. My interpretation: prosthetic hip dislocation Radiology interpretation:  IMPRESSION:  1. Posterior dislocation of the right femoral head component relative to the  acetabular cup.   I independently interpreted and visualized the right hip x-ray. My interpretation: appropriate alignment post reduction Radiology interpretation:  IMPRESSION:  1. 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 No fracture  identified.  2. Unremarkable left hip arthroplasty.      PROCEDURES:  Critical Care performed: No  .Sedation  Date/Time: 07/27/2024 7:14 PM  Performed by: Floy Roberts, MD Authorized by: Floy Roberts, MD   Consent:    Consent obtained:  Written   Consent given by:  Patient   Risks discussed:  Nausea, vomiting, prolonged sedation necessitating reversal and respiratory compromise necessitating ventilatory assistance and intubation Universal protocol:    Immediately prior to procedure, a time out was called: yes   Pre-sedation assessment:    Time since last food or drink:  Unknown   NPO status caution: urgency dictates proceeding with non-ideal NPO status     ASA classification: class 2 - patient with mild systemic disease     Mallampati score:  II - soft palate, uvula, fauces visible   Pre-sedation assessments completed and reviewed: airway patency, cardiovascular function, hydration status, mental status, nausea/vomiting, pain level and respiratory function   A pre-sedation assessment was completed prior to the start of the procedure Procedure details (see MAR for exact dosages):    Sedation:  Ketamine   Intended level of sedation: deep   Total Provider sedation time (minutes):  20 Post-procedure details:    A post-sedation assessment was completed following the completion of the procedure.   Procedure completion:  Tolerated well, no immediate complications   Reduction of dislocation Performed by: Guadalupe Eagles Authorized by: Guadalupe Eagles Consent: Verbal and written consent obtained. Risks and benefits: risks, benefits and alternatives were discussed Consent given by: patient Required items: required blood products, implants, devices, and special equipment available Time out: Immediately prior to procedure a time out was called to verify the correct patient, procedure, equipment, support staff and site/side marked as required.  Patient sedated: Ketamine   Vitals: Vital signs were monitored during sedation. Patient tolerance: Patient tolerated the procedure well with no immediate complications. Joint: Right hip Reduction technique: traction    MEDICATIONS ORDERED IN ED: Medications - No data to display   IMPRESSION / MDM / ASSESSMENT AND PLAN / ED COURSE  I reviewed the triage vital signs and the nursing notes.                              Differential diagnosis includes, but is not limited to, hip dislocation, fracture  Patient's presentation is most consistent with acute presentation with potential threat to life or bodily function.   The patient is on the cardiac monitor to evaluate for evidence of arrhythmia and/or significant heart rate changes.  Patient presented to the emergency department today because of concerns for right hip pain and probable dislocation.  Patient has history of prosthetic hip and had dislocation recently.  Exam is consistent with a dislocation.  Patient was sedated and hip was reduced.  Repeat x-rays confirmed appropriate anatomic alignment.  Patient was placed in knee immobilizer.  Will discharge to follow-up with orthopedics.      FINAL CLINICAL IMPRESSION(S) / ED DIAGNOSES   Final diagnoses:  Closed posterior dislocation of right hip,  initial encounter [facility])     Note:  This document was prepared using Dragon voice recognition software and may include unintentional dictation errors.    Eagles Guadalupe, MD 07/27/24 [phone]

## 2024-07-27 NOTE — ED Notes (Signed)
Consent obtained

## 2024-07-27 NOTE — ED Triage Notes (Signed)
 Pt arrives via [facility] from [facility] for R hip dislocation. Pt had reduction last week on same hip. Pt received 100mcg fentanyl  en route.   BP 137/86 HR 60 96% RA

## 2024-07-27 NOTE — ED Notes (Signed)
 Pt assisted with meal tray. Pt eating independently

## 2024-07-27 NOTE — Discharge Instructions (Signed)
 As discussed please try to limit any flexion of the hip. If sitting down please do not lean forward. Follow up with orthopedics.

## 2024-07-27 NOTE — ED Notes (Signed)
 [facility] should be here within 30 min

## 2024-07-28 DIAGNOSIS — S73014D Posterior dislocation of right hip, subsequent encounter: Secondary | ICD-10-CM | POA: Diagnosis not present

## 2024-07-28 DIAGNOSIS — N401 Enlarged prostate with lower urinary tract symptoms: Secondary | ICD-10-CM | POA: Diagnosis not present

## 2024-07-28 DIAGNOSIS — I10 Essential (primary) hypertension: Secondary | ICD-10-CM | POA: Diagnosis not present

## 2024-07-29 ENCOUNTER — Ambulatory Visit: Admitting: Sleep Medicine

## 2024-07-30 DIAGNOSIS — N179 Acute kidney failure, unspecified: Secondary | ICD-10-CM | POA: Diagnosis not present

## 2024-07-30 DIAGNOSIS — R945 Abnormal results of liver function studies: Secondary | ICD-10-CM | POA: Diagnosis not present

## 2024-07-30 DIAGNOSIS — G8929 Other chronic pain: Secondary | ICD-10-CM | POA: Diagnosis not present

## 2024-08-01 DIAGNOSIS — Z96641 Presence of right artificial hip joint: Secondary | ICD-10-CM | POA: Diagnosis not present

## 2024-08-01 DIAGNOSIS — M24451 Recurrent dislocation, right hip: Secondary | ICD-10-CM | POA: Diagnosis not present

## 2024-08-05 DIAGNOSIS — G8929 Other chronic pain: Secondary | ICD-10-CM | POA: Diagnosis not present

## 2024-08-05 DIAGNOSIS — N179 Acute kidney failure, unspecified: Secondary | ICD-10-CM | POA: Diagnosis not present

## 2024-08-05 DIAGNOSIS — R945 Abnormal results of liver function studies: Secondary | ICD-10-CM | POA: Diagnosis not present

## 2024-08-12 DIAGNOSIS — N1831 Chronic kidney disease, stage 3a: Secondary | ICD-10-CM | POA: Diagnosis not present

## 2024-08-12 DIAGNOSIS — S73014D Posterior dislocation of right hip, subsequent encounter: Secondary | ICD-10-CM | POA: Diagnosis not present

## 2024-08-12 DIAGNOSIS — I10 Essential (primary) hypertension: Secondary | ICD-10-CM | POA: Diagnosis not present

## 2024-08-12 DIAGNOSIS — I2510 Atherosclerotic heart disease of native coronary artery without angina pectoris: Secondary | ICD-10-CM | POA: Diagnosis not present

## 2024-08-20 DIAGNOSIS — Z955 Presence of coronary angioplasty implant and graft: Secondary | ICD-10-CM | POA: Diagnosis not present

## 2024-08-20 DIAGNOSIS — Z8781 Personal history of (healed) traumatic fracture: Secondary | ICD-10-CM | POA: Diagnosis not present

## 2024-08-20 DIAGNOSIS — R3915 Urgency of urination: Secondary | ICD-10-CM | POA: Diagnosis not present

## 2024-08-20 DIAGNOSIS — G912 (Idiopathic) normal pressure hydrocephalus: Secondary | ICD-10-CM | POA: Diagnosis not present

## 2024-09-01 DIAGNOSIS — R262 Difficulty in walking, not elsewhere classified: Secondary | ICD-10-CM | POA: Diagnosis not present

## 2024-09-01 DIAGNOSIS — M24451 Recurrent dislocation, right hip: Secondary | ICD-10-CM | POA: Diagnosis not present

## 2024-09-02 ENCOUNTER — Telehealth: Payer: Self-pay

## 2024-09-02 DIAGNOSIS — N3281 Overactive bladder: Secondary | ICD-10-CM

## 2024-09-02 MED ORDER — MIRABEGRON ER 50 MG PO TB24: 50.0000 mg | ORAL_TABLET | Freq: Every day | ORAL | 6 refills | Status: AC

## 2024-09-02 NOTE — Telephone Encounter (Signed)
 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 Patient has had recent numerous medical conditions keeping him in the hospital. He in the past just recently in June was on Myrbetriq  but due to cost couldn't afford it. I was able to search for a goodRx card for him and emailed that to him and sent the Rx to the new pharmacy which was [facility] in [place].

## 2024-09-05 ENCOUNTER — Telehealth: Payer: Self-pay

## 2024-09-05 DIAGNOSIS — M24451 Recurrent dislocation, right hip: Secondary | ICD-10-CM | POA: Diagnosis not present

## 2024-09-05 DIAGNOSIS — R262 Difficulty in walking, not elsewhere classified: Secondary | ICD-10-CM | POA: Diagnosis not present

## 2024-09-05 NOTE — Telephone Encounter (Signed)
 Pt requesting help with medication.States being in and out of hospital lately and finacially having issues with medication. I had tried to get a goodRx card from [facility] for Myrbetriq  and patient called stating this did not work. Pt did not qualify for the [employer]  card program either on their website.  Please advise.

## 2024-09-05 NOTE — Telephone Encounter (Signed)
 Would have him consider [facility].  He can come by the office or you can mail him a brochure

## 2024-09-08 NOTE — Telephone Encounter (Signed)
 Contacted patient and he was agreeable to getting [facility] info sent to him in the mail to look over it and see if this is a good option for him. Pt will notify us  once he makes his decision so we can go ahead and send in the PA for it.

## 2024-09-09 DIAGNOSIS — M24451 Recurrent dislocation, right hip: Secondary | ICD-10-CM | POA: Diagnosis not present

## 2024-09-09 DIAGNOSIS — R262 Difficulty in walking, not elsewhere classified: Secondary | ICD-10-CM | POA: Diagnosis not present

## 2024-09-12 DIAGNOSIS — R262 Difficulty in walking, not elsewhere classified: Secondary | ICD-10-CM | POA: Diagnosis not present

## 2024-09-12 DIAGNOSIS — M24451 Recurrent dislocation, right hip: Secondary | ICD-10-CM | POA: Diagnosis not present

## 2024-09-16 DIAGNOSIS — R262 Difficulty in walking, not elsewhere classified: Secondary | ICD-10-CM | POA: Diagnosis not present

## 2024-09-16 DIAGNOSIS — M24451 Recurrent dislocation, right hip: Secondary | ICD-10-CM | POA: Diagnosis not present

## 2024-09-17 ENCOUNTER — Ambulatory Visit: Payer: Self-pay | Admitting: Urology

## 2024-10-21 ENCOUNTER — Ambulatory Visit: Admitting: Urology

## 2024-10-21 ENCOUNTER — Encounter: Payer: Self-pay | Admitting: Urology
